# Patient Record
Sex: Female | Born: 1940 | Race: White | Hispanic: No | Smoking: Never smoker
Health system: Southern US, Community
[De-identification: ages and names within clinical notes are randomized; demographics above are authoritative.]

## PROBLEM LIST (undated history)

## (undated) DIAGNOSIS — I739 Peripheral vascular disease, unspecified: Secondary | ICD-10-CM

## (undated) DIAGNOSIS — I10 Essential (primary) hypertension: Secondary | ICD-10-CM

## (undated) DIAGNOSIS — I2510 Atherosclerotic heart disease of native coronary artery without angina pectoris: Secondary | ICD-10-CM

## (undated) DIAGNOSIS — I219 Acute myocardial infarction, unspecified: Secondary | ICD-10-CM

## (undated) DIAGNOSIS — I441 Atrioventricular block, second degree: Secondary | ICD-10-CM

## (undated) DIAGNOSIS — I4891 Unspecified atrial fibrillation: Secondary | ICD-10-CM

## (undated) DIAGNOSIS — M1990 Unspecified osteoarthritis, unspecified site: Secondary | ICD-10-CM

## (undated) DIAGNOSIS — I350 Nonrheumatic aortic (valve) stenosis: Secondary | ICD-10-CM

## (undated) DIAGNOSIS — I255 Ischemic cardiomyopathy: Secondary | ICD-10-CM

## (undated) DIAGNOSIS — I639 Cerebral infarction, unspecified: Secondary | ICD-10-CM

## (undated) DIAGNOSIS — R9430 Abnormal result of cardiovascular function study, unspecified: Secondary | ICD-10-CM

## (undated) DIAGNOSIS — E214 Other specified disorders of parathyroid gland: Secondary | ICD-10-CM

## (undated) DIAGNOSIS — IMO0002 Reserved for concepts with insufficient information to code with codable children: Secondary | ICD-10-CM

## (undated) DIAGNOSIS — D649 Anemia, unspecified: Secondary | ICD-10-CM

## (undated) DIAGNOSIS — I779 Disorder of arteries and arterioles, unspecified: Secondary | ICD-10-CM

## (undated) DIAGNOSIS — I509 Heart failure, unspecified: Secondary | ICD-10-CM

## (undated) DIAGNOSIS — E785 Hyperlipidemia, unspecified: Secondary | ICD-10-CM

## (undated) DIAGNOSIS — N186 End stage renal disease: Secondary | ICD-10-CM

## (undated) HISTORY — DX: Disorder of arteries and arterioles, unspecified: I77.9

## (undated) HISTORY — DX: Heart failure, unspecified: I50.9

## (undated) HISTORY — DX: Cerebral infarction, unspecified: I63.9

## (undated) HISTORY — PX: CHOLECYSTECTOMY: SHX55

## (undated) HISTORY — DX: Reserved for concepts with insufficient information to code with codable children: IMO0002

## (undated) HISTORY — PX: TOE AMPUTATION: SHX809

## (undated) HISTORY — DX: Unspecified atrial fibrillation: I48.91

## (undated) HISTORY — PX: CATARACT EXTRACTION: SUR2

## (undated) HISTORY — PX: RETINAL DETACHMENT SURGERY: SHX105

## (undated) HISTORY — DX: Anemia, unspecified: D64.9

## (undated) HISTORY — PX: OTHER SURGICAL HISTORY: SHX169

## (undated) HISTORY — DX: Atrioventricular block, second degree: I44.1

## (undated) HISTORY — PX: APPENDECTOMY: SHX54

## (undated) HISTORY — DX: Hyperlipidemia, unspecified: E78.5

## (undated) HISTORY — DX: Abnormal result of cardiovascular function study, unspecified: R94.30

## (undated) HISTORY — DX: Peripheral vascular disease, unspecified: I73.9

## (undated) HISTORY — PX: OOPHORECTOMY: SHX86

## (undated) HISTORY — PX: CORONARY ANGIOPLASTY: SHX604

## (undated) HISTORY — DX: Other specified disorders of parathyroid gland: E21.4

## (undated) HISTORY — DX: Essential (primary) hypertension: I10

## (undated) HISTORY — DX: Atherosclerotic heart disease of native coronary artery without angina pectoris: I25.10

---

## 1998-05-04 ENCOUNTER — Encounter: Payer: Self-pay | Admitting: Neurosurgery

## 1998-05-04 ENCOUNTER — Ambulatory Visit (HOSPITAL_COMMUNITY): Admission: RE | Admit: 1998-05-04 | Discharge: 1998-05-04 | Payer: Self-pay | Admitting: Neurosurgery

## 1998-05-18 ENCOUNTER — Encounter: Payer: Self-pay | Admitting: Neurosurgery

## 1998-05-18 ENCOUNTER — Ambulatory Visit (HOSPITAL_COMMUNITY): Admission: RE | Admit: 1998-05-18 | Discharge: 1998-05-18 | Payer: Self-pay | Admitting: Neurosurgery

## 1998-06-01 ENCOUNTER — Encounter: Payer: Self-pay | Admitting: Neurosurgery

## 1998-06-01 ENCOUNTER — Ambulatory Visit (HOSPITAL_COMMUNITY): Admission: RE | Admit: 1998-06-01 | Discharge: 1998-06-01 | Payer: Self-pay | Admitting: Neurosurgery

## 2004-10-04 ENCOUNTER — Ambulatory Visit: Payer: Self-pay | Admitting: Cardiology

## 2004-10-10 ENCOUNTER — Ambulatory Visit: Payer: Self-pay | Admitting: Cardiology

## 2004-11-20 ENCOUNTER — Encounter: Admission: RE | Admit: 2004-11-20 | Discharge: 2004-11-20 | Payer: Self-pay | Admitting: Neurosurgery

## 2004-12-06 ENCOUNTER — Encounter: Admission: RE | Admit: 2004-12-06 | Discharge: 2004-12-06 | Payer: Self-pay | Admitting: Neurosurgery

## 2005-01-29 DIAGNOSIS — I639 Cerebral infarction, unspecified: Secondary | ICD-10-CM

## 2005-01-29 HISTORY — DX: Cerebral infarction, unspecified: I63.9

## 2005-03-15 ENCOUNTER — Ambulatory Visit (HOSPITAL_COMMUNITY): Admission: RE | Admit: 2005-03-15 | Discharge: 2005-03-15 | Payer: Self-pay | Admitting: Ophthalmology

## 2005-11-09 ENCOUNTER — Ambulatory Visit: Payer: Self-pay | Admitting: Cardiology

## 2006-05-21 ENCOUNTER — Ambulatory Visit (HOSPITAL_COMMUNITY): Admission: RE | Admit: 2006-05-21 | Discharge: 2006-05-22 | Payer: Self-pay | Admitting: Ophthalmology

## 2006-06-25 ENCOUNTER — Ambulatory Visit: Payer: Self-pay | Admitting: Vascular Surgery

## 2006-06-25 ENCOUNTER — Ambulatory Visit (HOSPITAL_COMMUNITY): Admission: RE | Admit: 2006-06-25 | Discharge: 2006-06-25 | Payer: Self-pay | Admitting: *Deleted

## 2006-07-29 ENCOUNTER — Ambulatory Visit: Payer: Self-pay | Admitting: Vascular Surgery

## 2006-08-14 ENCOUNTER — Ambulatory Visit (HOSPITAL_COMMUNITY): Admission: RE | Admit: 2006-08-14 | Discharge: 2006-08-14 | Payer: Self-pay | Admitting: Vascular Surgery

## 2006-08-14 ENCOUNTER — Ambulatory Visit: Payer: Self-pay | Admitting: Vascular Surgery

## 2006-08-21 ENCOUNTER — Ambulatory Visit: Payer: Self-pay | Admitting: Vascular Surgery

## 2006-10-09 ENCOUNTER — Ambulatory Visit (HOSPITAL_COMMUNITY): Admission: RE | Admit: 2006-10-09 | Discharge: 2006-10-09 | Payer: Self-pay | Admitting: Nephrology

## 2006-11-27 ENCOUNTER — Ambulatory Visit: Payer: Self-pay | Admitting: Cardiology

## 2007-01-01 ENCOUNTER — Encounter: Payer: Self-pay | Admitting: Physician Assistant

## 2007-01-01 ENCOUNTER — Ambulatory Visit: Payer: Self-pay | Admitting: Cardiology

## 2007-01-09 ENCOUNTER — Ambulatory Visit: Payer: Self-pay | Admitting: Cardiology

## 2007-02-18 ENCOUNTER — Inpatient Hospital Stay (HOSPITAL_COMMUNITY): Admission: EM | Admit: 2007-02-18 | Discharge: 2007-02-24 | Payer: Self-pay | Admitting: Emergency Medicine

## 2007-02-18 ENCOUNTER — Ambulatory Visit: Payer: Self-pay | Admitting: Cardiovascular Disease

## 2008-01-14 ENCOUNTER — Ambulatory Visit (HOSPITAL_COMMUNITY): Admission: RE | Admit: 2008-01-14 | Discharge: 2008-01-14 | Payer: Self-pay | Admitting: Nephrology

## 2008-10-11 ENCOUNTER — Encounter: Payer: Self-pay | Admitting: Cardiology

## 2009-03-24 ENCOUNTER — Ambulatory Visit: Payer: Self-pay | Admitting: Cardiology

## 2009-03-31 ENCOUNTER — Inpatient Hospital Stay (HOSPITAL_COMMUNITY): Admission: AD | Admit: 2009-03-31 | Discharge: 2009-04-05 | Payer: Self-pay | Admitting: Cardiology

## 2009-03-31 ENCOUNTER — Ambulatory Visit: Payer: Self-pay | Admitting: Cardiology

## 2009-04-02 ENCOUNTER — Encounter: Payer: Self-pay | Admitting: Cardiology

## 2009-04-03 ENCOUNTER — Encounter: Payer: Self-pay | Admitting: Cardiology

## 2009-04-04 ENCOUNTER — Encounter: Payer: Self-pay | Admitting: Cardiology

## 2009-04-05 ENCOUNTER — Telehealth: Payer: Self-pay | Admitting: Cardiology

## 2009-04-20 ENCOUNTER — Encounter: Payer: Self-pay | Admitting: Cardiology

## 2009-04-22 DIAGNOSIS — E1149 Type 2 diabetes mellitus with other diabetic neurological complication: Secondary | ICD-10-CM | POA: Insufficient documentation

## 2009-04-22 DIAGNOSIS — Z8673 Personal history of transient ischemic attack (TIA), and cerebral infarction without residual deficits: Secondary | ICD-10-CM

## 2009-04-22 DIAGNOSIS — D631 Anemia in chronic kidney disease: Secondary | ICD-10-CM | POA: Insufficient documentation

## 2009-04-22 DIAGNOSIS — N039 Chronic nephritic syndrome with unspecified morphologic changes: Secondary | ICD-10-CM

## 2009-04-25 ENCOUNTER — Ambulatory Visit: Payer: Self-pay | Admitting: Cardiology

## 2009-04-26 ENCOUNTER — Encounter: Payer: Self-pay | Admitting: Cardiology

## 2009-05-06 ENCOUNTER — Encounter: Payer: Self-pay | Admitting: Cardiology

## 2009-06-13 ENCOUNTER — Encounter: Payer: Self-pay | Admitting: Cardiology

## 2009-07-01 ENCOUNTER — Encounter: Payer: Self-pay | Admitting: Cardiology

## 2009-07-04 ENCOUNTER — Ambulatory Visit: Payer: Self-pay | Admitting: Cardiology

## 2009-07-26 ENCOUNTER — Encounter: Payer: Self-pay | Admitting: Cardiology

## 2009-08-02 ENCOUNTER — Encounter: Payer: Self-pay | Admitting: Cardiology

## 2009-08-04 ENCOUNTER — Ambulatory Visit: Payer: Self-pay | Admitting: Cardiology

## 2009-08-11 ENCOUNTER — Encounter: Payer: Self-pay | Admitting: Cardiology

## 2009-08-18 ENCOUNTER — Encounter: Payer: Self-pay | Admitting: Cardiology

## 2009-09-07 ENCOUNTER — Ambulatory Visit: Payer: Self-pay | Admitting: Cardiology

## 2009-10-03 ENCOUNTER — Ambulatory Visit: Payer: Self-pay | Admitting: Cardiology

## 2009-10-10 ENCOUNTER — Encounter: Payer: Self-pay | Admitting: Cardiology

## 2009-11-07 ENCOUNTER — Ambulatory Visit: Payer: Self-pay | Admitting: Cardiology

## 2009-11-11 ENCOUNTER — Ambulatory Visit: Payer: Self-pay | Admitting: Cardiology

## 2009-11-11 ENCOUNTER — Encounter: Payer: Self-pay | Admitting: Cardiology

## 2009-11-18 ENCOUNTER — Encounter (INDEPENDENT_AMBULATORY_CARE_PROVIDER_SITE_OTHER): Payer: Self-pay | Admitting: *Deleted

## 2009-11-24 ENCOUNTER — Ambulatory Visit (HOSPITAL_COMMUNITY): Admission: RE | Admit: 2009-11-24 | Discharge: 2009-11-24 | Payer: Self-pay | Admitting: Ophthalmology

## 2009-12-02 ENCOUNTER — Ambulatory Visit: Payer: Self-pay | Admitting: Cardiology

## 2009-12-14 ENCOUNTER — Encounter: Payer: Self-pay | Admitting: Cardiology

## 2010-01-28 ENCOUNTER — Encounter: Payer: Self-pay | Admitting: Cardiology

## 2010-01-29 ENCOUNTER — Encounter: Payer: Self-pay | Admitting: Cardiology

## 2010-01-31 ENCOUNTER — Encounter: Payer: Self-pay | Admitting: Cardiology

## 2010-02-02 ENCOUNTER — Encounter: Payer: Self-pay | Admitting: Cardiology

## 2010-02-03 ENCOUNTER — Encounter: Payer: Self-pay | Admitting: Cardiology

## 2010-02-10 ENCOUNTER — Encounter: Payer: Self-pay | Admitting: Cardiology

## 2010-02-13 ENCOUNTER — Encounter: Payer: Self-pay | Admitting: Cardiology

## 2010-02-20 ENCOUNTER — Encounter: Payer: Self-pay | Admitting: Cardiology

## 2010-02-28 NOTE — Miscellaneous (Signed)
Summary: Home Care Report/ [facility]   Home Care Report/ [facility]   Imported By: Dorise Hiss 06/22/2009 16:46:22  _____________________________________________________________________  External Attachment:    Type:   Image     Comment:   External Document

## 2010-02-28 NOTE — Assessment & Plan Note (Signed)
Summary: **8:30** per Bjorn Loser B needs 3 month fu   Visit Type:  Follow-up Primary Provider:  Aurther Loft Daniel,MD  CC:  CAD.  History of Present Illness: Patient is seen for followup of coronary artery disease aortic stenosis, left ventricular dysfunction.  She had been hospitalized with some mild congestive heart failure and elevated troponins in March, 2011.  Cardiac catheterization was done at that time.  She does have significant disease that is followed medically.  Her LAD could be approached but it would be technically difficult.  She's not having significant signs of chest pain or heart failure.  At the time of her catheterization her ejection fraction appeared to be in the 35% range.  Continued adjustment of her meds will be done and then followup 2-D echo.  Preventive Screening-Counseling & Management  Alcohol-Tobacco     Smoking Status: never  Current Medications (verified): 1)  Amlodipine Besylate 5 Mg Tabs (Amlodipine Besylate) .... Take 1 Tablet By Mouth Once A Day 2)  Carvedilol 6.25 Mg Tabs (Carvedilol) .... Take 1 Tablet By Mouth Two Times A Day 3)  Apidra 100 Unit/ml Soln (Insulin Glulisine) .... As Directed Subcutaneously Three Times A Day 4)  Aspir-Low 81 Mg Tbec (Aspirin) .... Take 1 Tablet By Mouth Once A Day 5)  Benicar 40 Mg Tabs (Olmesartan Medoxomil) .... Take 1 Tablet By Mouth Once A Day 6)  Catapres-Tts-3 0.3 Mg/24hr Ptwk (Clonidine Hcl) .... Change Patch Weekly 7)  Zinc 50 Mg Tabs (Zinc) .... Take 1 Tablet By Mouth Once A Day 8)  Cardura Xl 8 Mg Xr24h-Tab (Doxazosin Mesylate) .... Take 1 Tablet By Mouth Once A Day 9)  Lantus 100 Unit/ml Soln (Insulin Glargine) .... Use As Directed Subcutaneously Daily 10)  Plavix 75 Mg Tabs (Clopidogrel Bisulfate) .... Take 1 Tablet By Mouth Once A Day 11)  Renagel 800 Mg Tabs (Sevelamer Hcl) .... Take 1-4 Tabs By Mouth Four Times A Day 12)  Sensipar 30 Mg Tabs (Cinacalcet Hcl) .... Take 1 Tablet By Mouth Once A Day 13)  Simvastatin  40 Mg Tabs (Simvastatin) .... Take 1 Tablet By Mouth Once A Day 14)  Omeprazole 20 Mg Cpdr (Omeprazole) .... Take 1 Tablet By Mouth Once A Day 15)  Dialyvite  Tabs (B Complex-C-Folic Acid) .... Take 1 Tablet By Mouth Once A Day 16)  Hydralazine Hcl 50 Mg Tabs (Hydralazine Hcl) .... Take 1 Tablet By Mouth Two Times A Day  Allergies (verified): 1)  ! * Narcotics Pills  Comments:  Nurse/Medical Assistant: The patient's medication list and allergies were reviewed with the patient and were updated in the Medication and Allergy Lists.   Past History:  Past Medical History: CAD  catheterization March, 2011. (presentation with mild CHF and mild increased troponin).. 70% proximal/80% mid LAD, 50% circumflex.Marland KitchenLAD could be approached but with difficulty....Marland Kitchen medical therapy recommended diabetes.  hyperlipidemia.  aortic stenosis  mild to moderate... echo.. September, 2006 / catheterization valve area 1.9 cm square EF  35%....catheterization.... 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  anemia.  CVA.  ESRD  .Marland Kitchen. dialysis Carotid artery disease.... Doppler... May 06, 2009 moderate plaque.... less than 50% stenosis bilateral     Review of Systems       Patient denies fever, chills, headache, sweats, rash, change in vision, change in hearing, chest pain, cough, shortness of breath, urinary symptoms.  She gets some nausea if she has not eaten.  She feels fatigued on her dialysis days.  Vital Signs:  Patient profile:  70 year old female Height:      64 inches Weight:      196 pounds Pulse rate:   71 / minute BP sitting:   178 / 67  (left arm) Cuff size:   large  Vitals Entered By: Carlye Grippe (July 04, 2009 8:21 AM)  Physical Exam  General:  patient is stable today but overweight. Head:  head is atraumatic. Eyes:  no xanthelasma. Neck:  no jugular venous distention.  Left carotid bruit present. Chest Wall:  no chest wall tenderness. Lungs:   lungs are clear.  Respiratory effort is nonlabored. Heart:  cardiac exam reveals an S1-S2.  There is a crescendo decrescendo murmur of aortic stenosis. Abdomen:  abdomen is soft. Msk:  no musculoskeletal deformities. Extremities:  trace peripheral edema. Skin:  no skin rashes. Psych:  patient is oriented to person time and place.  Affect is normal   Impression & Recommendations:  Problem # 1:  CAD (ICD-414.00)  Her updated medication list for this problem includes:    Amlodipine Besylate 5 Mg Tabs (Amlodipine besylate) .Marland Kitchen... Take 1 tablet by mouth once a day    Carvedilol 12.5 Mg Tabs (Carvedilol) .Marland Kitchen... Take one tablet by mouth twice a day    Aspir-low 81 Mg Tbec (Aspirin) .Marland Kitchen... Take 1 tablet by mouth once a day    Plavix 75 Mg Tabs (Clopidogrel bisulfate) .Marland Kitchen... Take 1 tablet by mouth once a day The patient's coronary disease is stable.  She's not having any chest pain.  Problem # 2:  RENAL INSUFFICIENCY (ICD-588.9) Patient has dialysis regularly.  She feels tired on her dialysis days.  Problem # 3:  ESSENTIAL HYPERTENSION, BENIGN (ICD-401.1)  Her updated medication list for this problem includes:    Amlodipine Besylate 5 Mg Tabs (Amlodipine besylate) .Marland Kitchen... Take 1 tablet by mouth once a day    Carvedilol 12.5 Mg Tabs (Carvedilol) .Marland Kitchen... Take one tablet by mouth twice a day    Aspir-low 81 Mg Tbec (Aspirin) .Marland Kitchen... Take 1 tablet by mouth once a day    Benicar 40 Mg Tabs (Olmesartan medoxomil) .Marland Kitchen... Take 1 tablet by mouth once a day    Catapres-tts-3 0.3 Mg/24hr Ptwk (Clonidine hcl) .Marland Kitchen... Change patch weekly    Hydralazine Hcl 50 Mg Tabs (Hydralazine hcl) .Marland Kitchen... Take 1 tablet by mouth two times a day The patient is on many medicines for blood pressure.  Today her systolic is still mildly elevated.  She needs further dose adjustment on her meds for her left ventricular dysfunction.  Carvedilol dose will be increased.  Problem # 4:  CAROTID BRUIT (ICD-785.9) The patient had followup  carotid Dopplers dated May 06, 2009.  She does have flat.  She has less then 50% bilateral disease.  She'll need followup in one year.  Problem # 5:  AORTIC VALVE DISORDERS (ICD-424.1)  Her updated medication list for this problem includes:    Carvedilol 12.5 Mg Tabs (Carvedilol) .Marland Kitchen... Take one tablet by mouth twice a day    Benicar 40 Mg Tabs (Olmesartan medoxomil) .Marland Kitchen... Take 1 tablet by mouth once a day The aortic stenosis is significant but not severe.  She will be followed.  Problem # 6:  * EF 35% The prior records suggest that her LV function was good.  Catheterization in March, 2011 revealed an ejection fraction of 35%.  Meds are being adjusted.  She is on ARB and carvedilol.  We are working hard to control her blood pressure area at some point we  will consider spironolactone.  Work today will increase her carvedilol from 6.25-12.5 b.i.d.  I will then see her back continue to titrate her meds.  Eventually we'll do a followup 2-D echo.  This  Patient Instructions: 1)  Increase Carvedilol (Coreg) to 12.5mg  by mouth two times a day. You may take 2 of your 6.25mg  tablets in the am and pm until gone. Then get new prescription filled for 12.5 mg tabets. 2)  Follow up appt on Tuesday, July 19th at 9:15am. Prescriptions: CARVEDILOL 12.5 MG TABS (CARVEDILOL) Take one tablet by mouth twice a day  #60 x 6   Entered by:   Cyril Loosen, RN, BSN   Authorized by:   Talitha Givens, MD, [facility]   Signed by:   Cyril Loosen, RN, BSN on 07/04/2009   Method used:   Electronically to        [facility]* (retail)       [address]       [place], [place]  [address]       Ph: [phone]       Fax: [phone]   RxID:   [id number]

## 2010-02-28 NOTE — Miscellaneous (Signed)
Summary: Home Health Certification/Care Plan   Home Health Certification/Care Plan   Imported By: Roderic Ovens 10/12/2009 11:37:27  _____________________________________________________________________  External Attachment:    Type:   Image     Comment:   External Document

## 2010-02-28 NOTE — Assessment & Plan Note (Signed)
Summary: eph post cath   Visit Type:  Follow-up Primary Provider:  Reuel Boom  CC:  follow-up visit.  History of Present Illness: 70 year old patient recently admitted to [facility] with congestive heart failure and mild increased troponin I. She underwent cardiac catheterization on March 31, 2009. Her ejection fraction was 35%. There was a 70% proximal and an 80% mid LAD. There was a 50% circumflex. There was moderately severe aortic stenosis although the aortic valve area was calculated at 1.9 cm. It was felt that the LAD would be difficult to approach although this could be entertained in the future if needed. Medical therapy was recommended. Note she also has end-stage renal disease. Since discharge she denies any dyspnea on exertion, orthopnea, PND, pedal edema, palpitations, syncope or chest pain.  Preventive Screening-Counseling & Management  Alcohol-Tobacco     Smoking Status: never  Current Medications (verified): 1)  Amlodipine Besylate 5 Mg Tabs (Amlodipine Besylate) .... Take 1 Tablet By Mouth Once A Day 2)  Carvedilol 3.125 Mg Tabs (Carvedilol) .... Take 1 Tablet By Mouth Two Times A Day 3)  Xopenex Hfa 45 Mcg/act Aero (Levalbuterol Tartrate) .... 2 Puffs Every 4 Hours As Needed 4)  Nystatin 100000 Unit/gm Oint (Nystatin) .... As Needed 5)  Apidra 100 Unit/ml Soln (Insulin Glulisine) .... 6u Subcutaneously Three Times A Day 6)  Aspir-Low 81 Mg Tbec (Aspirin) .... Take 1 Tablet By Mouth Once A Day 7)  Benicar 40 Mg Tabs (Olmesartan Medoxomil) .... Take 1 Tablet By Mouth Once A Day 8)  Catapres-Tts-3 0.3 Mg/24hr Ptwk (Clonidine Hcl) .... Change Patch Weekly 9)  Zinc 50 Mg Tabs (Zinc) .... Take 1 Tablet By Mouth Once A Day 10)  Cardura Xl 8 Mg Xr24h-Tab (Doxazosin Mesylate) .... Take 1 Tablet By Mouth Once A Day 11)  Epogen 10000 Unit/ml Soln (Epoetin Alfa) .... Three Times Weekly 12)  Hectorol 1 Mcg Caps (Doxercalciferol) .... Three Times Weekly 13)  Lantus 100 Unit/ml  Soln (Insulin Glargine) .... 50u Subcutaneously Daily 14)  Pantoprazole Sodium 40 Mg Tbec (Pantoprazole Sodium) .... Take 1 Tablet By Mouth Once A Day 15)  Plavix 75 Mg Tabs (Clopidogrel Bisulfate) .... Take 1 Tablet By Mouth Once A Day 16)  Renagel 800 Mg Tabs (Sevelamer Hcl) .... Take 1-4 Tabs By Mouth Four Times A Day 17)  Sensipar 30 Mg Tabs (Cinacalcet Hcl) .... Take 1 Tablet By Mouth Once A Day 18)  Venofer 20 Mg/ml Soln (Iron Sucrose) .... 50mg  Iv Weekly 19)  Simvastatin 40 Mg Tabs (Simvastatin) .... Take 1 Tablet By Mouth Once A Day  Allergies (verified): 1)  ! * Narcotics Pills  Comments:  Nurse/Medical Assistant: The patient's medications and allergies were reviewed with the patient and were updated in the Medication and Allergy Lists. List reviewed.  Past History:  Past Medical History: History of coronary artery disease.   History of diabetes.   History of hyperlipidemia.   History of mild to moderate aortic stenosis.   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   History of hypertension.   History of anemia.  History of CVA.  End-stage renal disease on maintenance hemodialysis Tuesday,       Thursday and Saturday.      Past Surgical History: Reviewed history from 04/22/2009 and no changes required.  PAST SURGICAL HISTORY:  Appendectomy, oophorectomy, left great toe   amputation, retinal surgery, and cataract surgery.   Social History: Reviewed history from 04/22/2009 and no  changes required.  The patient lives in [place].  She lives alone.  She is   retired.  She is divorced and has four children.  She has a daughter   with diabetes.  She has a son with hiatal hernia.  She does have a   nurse's aide that is at her house 5 days a week.  She has no history of   smoking.  She denies alcohol.  Denies drug abuse.     Smoking Status:  never  Review of Systems       no fevers or chills, productive cough, hemoptysis, dysphasia,  odynophagia, melena, hematochezia, dysuria, hematuria, rash, seizure activity, orthopnea, PND, pedal edema, claudication. Remaining systems are negative.   Vital Signs:  Patient profile:   70 year old female Height:      64 inches Weight:      197 pounds BMI:     33.94 Pulse rate:   87 / minute BP sitting:   177 / 71  (right arm) Cuff size:   large  Vitals Entered By: Carlye Grippe (April 25, 2009 10:08 AM)  Nutrition Counseling: Patient's BMI is greater than 25 and therefore counseled on weight management options. CC: follow-up visit   Physical Exam  General:  Well-developed well-nourished in no acute distress.  Skin is warm and dry.  HEENT is normal.  Neck is supple. No thyromegaly. Bilateral bruits. Chest is clear to auscultation with normal expansion.  Cardiovascular exam is regular rate and rhythm. 3/6 systolic murmur left sternal border Abdominal exam nontender or distended. No masses palpated. Right groin with no hematoma and no bruit. Extremities show trace edema. neuro grossly intact    Impression & Recommendations:  Problem # 1:  CAD (ICD-414.00)  Continue aspirin, Plavix, beta blocker and statin. Consider PCI of LAD if she develops chest pain. Her updated medication list for this problem includes:    Amlodipine Besylate 5 Mg Tabs (Amlodipine besylate) .Marland Kitchen... Take 1 tablet by mouth once a day    Carvedilol 6.25 Mg Tabs (Carvedilol) .Marland Kitchen... Take 1 tablet by mouth two times a day    Aspir-low 81 Mg Tbec (Aspirin) .Marland Kitchen... Take 1 tablet by mouth once a day    Plavix 75 Mg Tabs (Clopidogrel bisulfate) .Marland Kitchen... Take 1 tablet by mouth once a day  Her updated medication list for this problem includes:    Amlodipine Besylate 5 Mg Tabs (Amlodipine besylate) .Marland Kitchen... Take 1 tablet by mouth once a day    Carvedilol 6.25 Mg Tabs (Carvedilol) .Marland Kitchen... Take 1 tablet by mouth two times a day    Aspir-low 81 Mg Tbec (Aspirin) .Marland Kitchen... Take 1 tablet by mouth once a day    Plavix 75 Mg Tabs  (Clopidogrel bisulfate) .Marland Kitchen... Take 1 tablet by mouth once a day  Problem # 2:  AORTIC VALVE DISORDERS (ICD-424.1) History of mild to moderate aortic stenosis. Plan repeat echocardiogram in 6 months when she returns. Her updated medication list for this problem includes:    Carvedilol 3.125 Mg Tabs (Carvedilol) .Marland Kitchen... Take 1 tablet by mouth two times a day    Benicar 40 Mg Tabs (Olmesartan medoxomil) .Marland Kitchen... Take 1 tablet by mouth once a day  Problem # 3:  RENAL INSUFFICIENCY (ICD-588.9) Dialysis dependent. Management per nephrology.  Problem # 4:  ESSENTIAL HYPERTENSION, BENIGN (ICD-401.1) Blood pressure elevated. Increase carvedilol to 6.25 mg p.o. b.i.d. both for blood pressure and cardiomyopathy. The following medications were removed from the medication list:    Hydralazine Hcl 25 Mg  Tabs (Hydralazine hcl) .Marland Kitchen... Take 1 tablet by mouth two times a day Her updated medication list for this problem includes:    Amlodipine Besylate 5 Mg Tabs (Amlodipine besylate) .Marland Kitchen... Take 1 tablet by mouth once a day    Carvedilol 6.25 Mg Tabs (Carvedilol) .Marland Kitchen... Take 1 tablet by mouth two times a day    Aspir-low 81 Mg Tbec (Aspirin) .Marland Kitchen... Take 1 tablet by mouth once a day    Benicar 40 Mg Tabs (Olmesartan medoxomil) .Marland Kitchen... Take 1 tablet by mouth once a day    Catapres-tts-3 0.3 Mg/24hr Ptwk (Clonidine hcl) .Marland Kitchen... Change patch weekly  Problem # 5:  CAROTID BRUIT (ICD-785.9) Check carotid Dopplers. Orders: Carotid Duplex (Carotid Duplex)  Problem # 6:  HYPERLIPIDEMIA (ICD-272.4) Continue statin. Lipids and liver monitored by her primary care. Her updated medication list for this problem includes:    Simvastatin 40 Mg Tabs (Simvastatin) .Marland Kitchen... Take 1 tablet by mouth once a day  Her updated medication list for this problem includes:    Simvastatin 40 Mg Tabs (Simvastatin) .Marland Kitchen... 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 Take 1 tablet by mouth once a day    Benicar 40 Mg Tabs (Olmesartan medoxomil) .Marland Kitchen... Take 1 tablet by mouth once a day    Lantus 100 Unit/ml Soln (Insulin glargine) .Marland KitchenMarland KitchenMarland KitchenMarland Kitchen 50u subcutaneously daily  Her updated medication list for this problem includes:    Apidra 100 Unit/ml Soln (Insulin glulisine) .Marland KitchenMarland KitchenMarland KitchenMarland Kitchen 6u subcutaneously three times a day    Aspir-low 81 Mg Tbec (Aspirin) .Marland Kitchen... Take 1 tablet by mouth once a day    Benicar 40 Mg Tabs (Olmesartan medoxomil) .Marland Kitchen... Take 1 tablet by mouth once a day    Lantus 100 Unit/ml Soln (Insulin glargine) .Marland KitchenMarland KitchenMarland KitchenMarland Kitchen 50u subcutaneously daily  Problem # 8:  OTHER SPEC FORMS CHRONIC ISCHEMIC HEART DISEASE (ICD-414.8)  Continue present medications. Increase Coreg as outlined above. Her updated medication list for this problem includes:    Amlodipine Besylate 5 Mg Tabs (Amlodipine besylate) .Marland Kitchen... Take 1 tablet by mouth once a day    Carvedilol 6.25 Mg Tabs (Carvedilol) .Marland Kitchen... Take 1 tablet by mouth two times a day    Aspir-low 81 Mg Tbec (Aspirin) .Marland Kitchen... Take 1 tablet by mouth once a day    Benicar 40 Mg Tabs (Olmesartan medoxomil) .Marland Kitchen... Take 1 tablet by mouth once a day    Plavix 75 Mg Tabs (Clopidogrel bisulfate) .Marland Kitchen... Take 1 tablet by mouth once a day  Her updated medication list for this problem includes:    Amlodipine Besylate 5 Mg Tabs (Amlodipine besylate) .Marland Kitchen... Take 1 tablet by mouth once a day    Carvedilol 6.25 Mg Tabs (Carvedilol) .Marland Kitchen... Take 1 tablet by mouth two times a day    Aspir-low 81 Mg Tbec (Aspirin) .Marland Kitchen... Take 1 tablet by mouth once a day    Benicar 40 Mg Tabs (Olmesartan medoxomil) .Marland Kitchen... Take 1 tablet by mouth once a day    Plavix 75 Mg Tabs (Clopidogrel bisulfate) .Marland Kitchen... Take 1 tablet by mouth once a day  Patient Instructions: 1)  Your physician recommends that you schedule  a follow-up appointment in: KEEP SCHEDULED APPOINTMENT WITH DR. KATZ ON JUNE 6TH 2011 @9 :00AM. 2)  Your physician  has recommended you make the following change in your medication: INCREASE CARVEDILOL TO 6.25MG  TWICE DAILY. You may take two of your 3.125mg  twice daily  until finished. 3)  Your physician has requested that you have a carotid duplex. This test is an ultrasound of the carotid arteries in your neck. It looks at blood flow through these arteries that supply the brain with blood. Allow one hour for this exam. There are no restrictions or special instructions. Prescriptions: CARVEDILOL 6.25 MG TABS (CARVEDILOL) Take 1 tablet by mouth two times a day  #60 x 6   Entered by:   Carlye Grippe   Authorized by:   Ferman Hamming, MD, [facility]   Signed by:   Carlye Grippe on 04/25/2009   Method used:   Electronically to        [facility]* (retail)       [address]       [place], [place]  [address]       Ph: [phone]       Fax: [phone]   RxID:   [id number]

## 2010-02-28 NOTE — Miscellaneous (Signed)
Summary: Home Care Report/ [facility]  Home Care Report/ [facility]   Imported By: Dorise Hiss 05/12/2009 16:25:11  _____________________________________________________________________  External Attachment:    Type:   Image     Comment:   External Document

## 2010-02-28 NOTE — Assessment & Plan Note (Signed)
Summary: 6 WK F/U-JM   Visit Type:  Follow-up Primary Provider:  Guido Sander  CC:  cardiomyopathy.  History of Present Illness: Melissa [name] is seen in followup cardiomyopathy.  I saw her last July 04, 2009.  At that time her carvedilol dose was increased.  The Melissa [name] now has edema.  This is despite her dialysis.  She notes that she actually has responded to furosemide in the past and that she does currently make urine.  Preventive Screening-Counseling & Management  Alcohol-Tobacco     Smoking Status: never  Current Medications (verified): 1)  Amlodipine Besylate 5 Mg Tabs (Amlodipine Besylate) .... Take 1 Tablet By Mouth Once A Day 2)  Carvedilol 12.5 Mg Tabs (Carvedilol) .... Take One Tablet By Mouth Twice A Day 3)  Apidra 100 Unit/ml Soln (Insulin Glulisine) .... As Directed Subcutaneously Three Times A Day 4)  Aspir-Low 81 Mg Tbec (Aspirin) .... Take 1 Tablet By Mouth Once A Day 5)  Benicar 40 Mg Tabs (Olmesartan Medoxomil) .... Take 1 Tablet By Mouth Once A Day 6)  Catapres-Tts-3 0.3 Mg/24hr Ptwk (Clonidine Hcl) .... Change Patch Weekly 7)  Zinc 50 Mg Tabs (Zinc) .... Take 1 Tablet By Mouth Once A Day 8)  Cardura Xl 8 Mg Xr24h-Tab (Doxazosin Mesylate) .... Take 1 Tablet By Mouth Once A Day 9)  Lantus 100 Unit/ml Soln (Insulin Glargine) .... Use As Directed Subcutaneously Daily 10)  Plavix 75 Mg Tabs (Clopidogrel Bisulfate) .... Take 1 Tablet By Mouth Once A Day 11)  Renagel 800 Mg Tabs (Sevelamer Hcl) .... Take 1-4 Tabs By Mouth Four Times A Day 12)  Sensipar 30 Mg Tabs (Cinacalcet Hcl) .... Take 1 Tablet By Mouth Once A Day 13)  Simvastatin 40 Mg Tabs (Simvastatin) .... Take 1 Tablet By Mouth Once A Day 14)  Omeprazole 20 Mg Cpdr (Omeprazole) .... Take 1 Tablet By Mouth Once A Day 15)  Dialyvite  Tabs (B Complex-C-Folic Acid) .... Take 1 Tablet By Mouth Once A Day 16)  Hydralazine Hcl 50 Mg Tabs (Hydralazine Hcl) .... Take 1 Tablet By Mouth Two Times A Day  Allergies  (verified): 1)  ! * Narcotics Pills  Comments:  Nurse/Medical Assistant: The Melissa [name]'s medication list and allergies were reviewed with the Melissa [name] and were updated in the Medication and Allergy Lists.  Past History:  Past Medical History: CAD  catheterization March, 2011. (presentation with mild CHF and mild increased troponin).. 70% proximal/80% mid LAD, 50% circumflex.Marland KitchenLAD could be approached but with difficulty....Marland Kitchen medical therapy recommended diabetes.  hyperlipidemia.  aortic stenosis  mild to moderate... echo.. September, 2006 / catheterization valve area 1.9 cm square EF  35%....catheterization.... 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  anemia.  CVA.  ESRD  .Marland Kitchen. dialysis Carotid artery disease.... Doppler... May 06, 2009 moderate plaque.... less than 50% stenosis bilateral Edema     Review of Systems       Melissa [name] denies fever, chills, headache, sweats, rash, change in vision, change in hearing, chest pain, cough, nausea vomiting, urinary symptoms.  All other systems are reviewed and are negative.  Vital Signs:  Melissa [name] profile:   70 year old female Height:      64 inches Weight:      200 pounds Pulse rate:   67 / minute BP sitting:   170 / 76  (right arm) Cuff size:   large  Vitals Entered By: Carlye Grippe (September 07, 2009 10:01 AM)  Physical Exam  General:  Melissa [name] is stable. Eyes:  no xanthelasma. Neck:  no jugular venous distention. Lungs:  lungs reveal a few scattered rhonchi.  There is no respiratory distress. Heart:  cardiac exam reveals S1 and S2 and no murmur of aortic stenosis. Abdomen:  abdomen is soft. Extremities:  there is 1+ peripheral edema. Psych:  Melissa [name] is oriented to person time and place.  Affect is normal.   Impression & Recommendations:  Problem # 1:  * EF 35% I will not adjust her carvedilol further today as I will be changing other medicines.  Problem # 2:  EDEMA (ICD-782.3) The  Melissa [name] is bothered by her edema.  She feels that she will do better responding to the re\re addition of her diuretic has a posterior pushing harder at dialysis.  In this case this may work as she does make urine.  Furosemide 40 mg daily will be added.  All and see her for followup.  Melissa [name] Instructions: 1)  Start Lasix (furosemide) 40mg  by mouth once daily. 2)  Return office visit with Dr. Myrtis Ser on Monday, November 07, 2009 at 9am. Prescriptions: FUROSEMIDE 40 MG TABS (FUROSEMIDE) Take one tablet by mouth daily.  #30 x 6   Entered by:   Cyril Loosen, RN, BSN   Authorized by:   Talitha Givens, MD, [facility]   Signed by:   Cyril Loosen, RN, BSN on 09/07/2009   Method used:   Electronically to        [facility]* (retail)       [address]       [place], [place]  [address]       Ph: [phone]       Fax: [phone]   RxID:   [id number]

## 2010-02-28 NOTE — Assessment & Plan Note (Signed)
Summary: 2 MONTH F/U PER 8/10 OV-JM   Visit Type:  Follow-up Primary Melissa [name]:  Guido Sander  CC:  cardiomyopathy.  History of Present Illness: The patient is seen for followup of cardiomyopathy.  I saw her last August, 2011.  She does have coronary disease.  Medical therapy is recommended.  She also has aortic stenosis.  Left ventricular dysfunction is out of proportion to her coronary disease and aortic disease.  We have adjusted her medications.  When I saw her last we added a diuretic.  Even though she has dialysis she makes urine at home and the diuretics help with her overall volume status.  She feels good in general.  Preventive Screening-Counseling & Management  Alcohol-Tobacco     Smoking Status: never  Current Medications (verified): 1)  Amlodipine Besylate 5 Mg Tabs (Amlodipine Besylate) .... Take 1 Tablet By Mouth Once A Day 2)  Carvedilol 12.5 Mg Tabs (Carvedilol) .... Take One Tablet By Mouth Twice A Day 3)  Apidra 100 Unit/ml Soln (Insulin Glulisine) .... As Directed Subcutaneously Three Times A Day 4)  Aspir-Low 81 Mg Tbec (Aspirin) .... Take 1 Tablet By Mouth Once A Day 5)  Benicar 40 Mg Tabs (Olmesartan Medoxomil) .... Take 1 Tablet By Mouth Once A Day 6)  Catapres-Tts-3 0.3 Mg/24hr Ptwk (Clonidine Hcl) .... Change Patch Weekly 7)  Zinc 50 Mg Tabs (Zinc) .... Take 1 Tablet By Mouth Once A Day 8)  Cardura Xl 8 Mg Xr24h-Tab (Doxazosin Mesylate) .... Take 1 Tablet By Mouth Once A Day 9)  Lantus 100 Unit/ml Soln (Insulin Glargine) .... Use As Directed Subcutaneously Daily 10)  Plavix 75 Mg Tabs (Clopidogrel Bisulfate) .... Take 1 Tablet By Mouth Once A Day 11)  Renagel 800 Mg Tabs (Sevelamer Hcl) .... Take 1-4 Tabs By Mouth Four Times A Day 12)  Sensipar 30 Mg Tabs (Cinacalcet Hcl) .... Take 1 Tablet By Mouth Once A Day 13)  Simvastatin 40 Mg Tabs (Simvastatin) .... Take 1 Tablet By Mouth Once A Day 14)  Omeprazole 20 Mg Cpdr (Omeprazole) .... Take 1 Tablet By Mouth  Once A Day 15)  Dialyvite  Tabs (B Complex-C-Folic Acid) .... Take 1 Tablet By Mouth Once A Day 16)  Hydralazine Hcl 50 Mg Tabs (Hydralazine Hcl) .... Take 1 Tablet By Mouth Two Times A Day 17)  Furosemide 40 Mg Tabs (Furosemide) .... Take One Tablet By Mouth Daily.  Allergies (verified): 1)  ! * Narcotics Pills  Comments:  Nurse/Medical Assistant: The patient's medication list and allergies were reviewed with the patient and were updated in the Medication and Allergy Lists.  Past History:  Past Medical History: CAD  catheterization March, 2011. (presentation with mild CHF and mild increased troponin).. 70% proximal/80% mid LAD, 50% circumflex.Marland KitchenLAD could be approached but with difficulty....Marland Kitchen medical therapy recommended diabetes.  hyperlipidemia.  aortic stenosis  mild to moderate... echo.. September, 2006 / catheterization valve area 1.9 cm square EF  35%....catheterization.... 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  anemia.  CVA.  ESRD  .Marland Kitchen. dialysis Carotid artery disease.... Doppler... May 06, 2009 moderate plaque.... less than 50% stenosis bilateral Edema     Review of Systems       Patient denies fever, chills, headache, sweats, rash, change in vision, change in hearing, chest pain, cough, nausea vomiting, urinary symptoms.  All other systems are reviewed and are negative.  Vital Signs:  Patient profile:   70 year old female Height:      64 inches  Weight:      198 pounds Pulse rate:   67 / minute BP sitting:   174 / 75  (right arm) Cuff size:   large  Vitals Entered By: Carlye Grippe (November 07, 2009 8:47 AM)  Physical Exam  General:  patient is stable today. Head:  head is atraumatic. Eyes:  no xanthelasma. Neck:  no jugular venous distention. Chest Wall:  no chest wall tenderness. Lungs:  lungs are clear.  Respiratory effort is nonlabored. Heart:  cardiac exam reveals an S1 and S2.  There is a murmur of aortic  stenosis. Abdomen:  abdomen is soft. Msk:  no musculoskeletal deformities. Extremities:  no peripheral edema. Skin:  no skin rashes. Psych:  patient is oriented to person time and place.  Affect is normal.   Impression & Recommendations:  Problem # 1:  EDEMA (ICD-782.3) The patient does not have any significant edema today.  Her furosemide will be continued.  This does not appear to be causing difficulties with her dialysis.  Problem # 2:  * EF 35% It is now time for a followup two-dimensional echo to reassess her LV function.  This will be scheduled.  I will be in touch with her with the information.  I will plan to see her back in 3 months for followup.  Problem # 3:  CAD (ICD-414.00)  Her updated medication list for this problem includes:    Amlodipine Besylate 5 Mg Tabs (Amlodipine besylate) .Marland Kitchen... Take 1 tablet by mouth once a day    Carvedilol 12.5 Mg Tabs (Carvedilol) .Marland Kitchen... Take one tablet by mouth twice a day    Aspir-low 81 Mg Tbec (Aspirin) .Marland Kitchen... Take 1 tablet by mouth once a day    Plavix 75 Mg Tabs (Clopidogrel bisulfate) .Marland Kitchen... Take 1 tablet by mouth once a day Coronary disease is stable.  No change in therapy.  Problem # 4:  RENAL INSUFFICIENCY (ICD-588.9) The patient continues on dialysis.  She is stable with this.  Problem # 5:  ESSENTIAL HYPERTENSION, BENIGN (ICD-401.1)  Her updated medication list for this problem includes:    Amlodipine Besylate 5 Mg Tabs (Amlodipine besylate) .Marland Kitchen... Take 1 tablet by mouth once a day    Carvedilol 12.5 Mg Tabs (Carvedilol) .Marland Kitchen... Take one tablet by mouth twice a day    Aspir-low 81 Mg Tbec (Aspirin) .Marland Kitchen... Take 1 tablet by mouth once a day    Benicar 40 Mg Tabs (Olmesartan medoxomil) .Marland Kitchen... Take 1 tablet by mouth once a day    Catapres-tts-3 0.3 Mg/24hr Ptwk (Clonidine hcl) .Marland Kitchen... Change patch weekly    Hydralazine Hcl 50 Mg Tabs (Hydralazine hcl) .Marland Kitchen... Take 1 tablet by mouth two times a day    Furosemide 40 Mg Tabs (Furosemide) .Marland Kitchen...  Take one tablet by mouth daily. The patient is on many medications.  Her blood pressure can vary significantly.  I will not be changing her medicine today.  Problem # 6:  AORTIC VALVE DISORDERS (ICD-424.1)  Her updated medication list for this problem includes:    Carvedilol 12.5 Mg Tabs (Carvedilol) .Marland Kitchen... Take one tablet by mouth twice a day    Benicar 40 Mg Tabs (Olmesartan medoxomil) .Marland Kitchen... Take 1 tablet by mouth once a day    Furosemide 40 Mg Tabs (Furosemide) .Marland Kitchen... Take one tablet by mouth daily. For aortic valve stenosis is followed at this time.  It is mild-to-moderate.  Orders: 2-D Echocardiogram (2D Echo)  Patient Instructions: 1)  Your physician wants you to follow-up in: 3  months. You will receive a reminder letter in the mail one-two months in advance. If you don't receive a letter, please call our office to schedule the follow-up appointment. 2)  Your physician recommends that you continue on your current medications as directed. Please refer to the Current Medication list given to you today. 3)  Your physician has requested that you have an echocardiogram.  Echocardiography is a painless test that uses sound waves to create images of your heart. It provides your doctor with information about the size and shape of your heart and how well your heart's chambers and valves are working.  This procedure takes approximately one hour. There are no restrictions for this procedure. If the results of your test are normal or stable, you will receive a letter. If they are abnormal, the nurse will contact you by phone.

## 2010-02-28 NOTE — Progress Notes (Signed)
Summary: discharge meds not covered by insurance  Phone Note From Pharmacy Call back at [phone]   Caller: [facility] Summary of Call: Insurance will not cover Xopenex prescribed at discharge today by [place] PA.  Is there an alternative.  Patient is to be followed at [facility] office by Dr. Jens Som. Initial call taken by: Burnard Leigh,  April 05, 2009 4:14 PM  Follow-up for Phone Call        spoke with pharm, script will be changed to albuterol Deliah Goody, RN  April 05, 2009 4:28 PM

## 2010-02-28 NOTE — Miscellaneous (Signed)
Summary: Home Care Report/ [facility]  Home Care Report/ [facility]   Imported By: Dorise Hiss 05/05/2009 14:48:50  _____________________________________________________________________  External Attachment:    Type:   Image     Comment:   External Document

## 2010-02-28 NOTE — Letter (Signed)
Summary: [facility]  [address]   [place], [place] [address]   Phone: [phone]  Fax: [phone]        November 18, 2009 MRN: [id number]    JILLIENNE EGNER [address] [place], [place]  [address]    Dear Ms. Yetta Barre,  Your test ordered by Selena Batten has been reviewed by your physician (or physician assistant) and was found to be normal or stable. Your physician (or physician assistant) felt no changes were needed at this time.  __X__ Echocardiogram  ____ Cardiac Stress Test  ____ Lab Work  ____ Peripheral vascular study of arms, legs or neck  ____ CT scan or X-ray  ____ Lung or Breathing test  ____ Other:   Thank you.   Cyril Loosen, RN, BSN    Duane Boston, M.D., F.A.C.C. Thressa Sheller, M.D., F.A.C.C. Oneal Grout, M.D., F.A.C.C. Cheree Ditto, M.D., F.A.C.C. Daiva Nakayama, M.D., F.A.C.C. Kenney Houseman, M.D., F.A.C.C. Jeanne Ivan, PA-C

## 2010-02-28 NOTE — Letter (Signed)
Summary: Appointment- Rescheduled  [facility] [address]   [place], [place] [address]   Phone: [phone]  Fax: [phone]     August 18, 2009 MRN: [id number]     CHARNEL GILES [address] [place], [place]  [address]     Dear Ms. KOSINSKI,   Due to a change in our office schedule, your appointment time on   September 07, 2009 at 2:30 must be changed.    Your new appointment time for September 07, 2009 will be 10:45.  We look forward to participating in your health care needs.     Sincerely,  Glass blower/designer

## 2010-02-28 NOTE — Miscellaneous (Signed)
Summary: Home Health Certification/Care Plan   Home Health Certification/Care Plan   Imported By: Roderic Ovens 01/05/2010 08:50:04  _____________________________________________________________________  External Attachment:    Type:   Image     Comment:   External Document

## 2010-02-28 NOTE — Miscellaneous (Signed)
Summary: Medical Update/Patient Information   Medical Update/Patient Information   Imported By: Roderic Ovens 08/17/2009 14:31:10  _____________________________________________________________________  External Attachment:    Type:   Image     Comment:   External Document

## 2010-02-28 NOTE — Miscellaneous (Signed)
  Clinical Lists Changes  Problems: Added new problem of CAD (ICD-414.00) Observations: Added new observation of PAST MED HX: CAD  catheterization March, 2011... 70% proximal/80% mid LAD, 50% circumflex.Marland KitchenLAD could be approached but with difficulty medical therapy recommend is diabetes.  hyperlipidemia.  aortic stenosis  mild to moderate... echo.. 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  anemia.  CVA.  ESRD  .Marland Kitchen. dialysis Carotid artery disease.... Doppler... May 06, 2009 moderate plaque.... less than 50% stenosis bilateral     (07/01/2009 17:28) Added new observation of PRIMARY MD: Reuel Boom (07/01/2009 17:28)       Past History:  Past Medical History: CAD  catheterization March, 2011... 70% proximal/80% mid LAD, 50% circumflex.Marland KitchenLAD could be approached but with difficulty medical therapy recommend is diabetes.  hyperlipidemia.  aortic stenosis  mild to moderate... echo.. 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  anemia.  CVA.  ESRD  .Marland Kitchen. dialysis Carotid artery disease.... Doppler... May 06, 2009 moderate plaque.... less than 50% stenosis bilateral

## 2010-03-02 NOTE — Miscellaneous (Signed)
Summary: Home Care Report/ DISCHARGE  [facility]  Home Care Report/ DISCHARGE  [facility]   Imported By: Dorise Hiss 02/20/2010 08:38:51  _____________________________________________________________________  External Attachment:    Type:   Image     Comment:   External Document

## 2010-04-02 ENCOUNTER — Encounter: Payer: Self-pay | Admitting: Cardiology

## 2010-04-03 ENCOUNTER — Encounter: Payer: Self-pay | Admitting: Cardiology

## 2010-04-03 ENCOUNTER — Ambulatory Visit (INDEPENDENT_AMBULATORY_CARE_PROVIDER_SITE_OTHER): Payer: Medicare Other | Admitting: Cardiology

## 2010-04-03 DIAGNOSIS — I059 Rheumatic mitral valve disease, unspecified: Secondary | ICD-10-CM

## 2010-04-03 DIAGNOSIS — I2510 Atherosclerotic heart disease of native coronary artery without angina pectoris: Secondary | ICD-10-CM

## 2010-04-03 DIAGNOSIS — I6529 Occlusion and stenosis of unspecified carotid artery: Secondary | ICD-10-CM

## 2010-04-06 NOTE — Medication Information (Signed)
Summary: [facility] D/C MEDICATION SHEET  [facility] D/C MEDICATION SHEET   Imported By: Zachary George 03/31/2010 17:18:35  _____________________________________________________________________  External Attachment:    Type:   Image     Comment:   External Document

## 2010-04-06 NOTE — Consult Note (Signed)
Summary: CARDIOLOGY CONSULT/ [facility]  CARDIOLOGY CONSULT/ [facility]   Imported By: Zachary George 03/31/2010 17:13:14  _____________________________________________________________________  External Attachment:    Type:   Image     Comment:   External Document

## 2010-04-06 NOTE — Consult Note (Signed)
Summary: [facility] DR. Wolfgang Phoenix  [facility] DR[facility] Wolfgang Phoenix   Imported By: Zachary George 03/31/2010 17:11:51  _____________________________________________________________________  External Attachment:    Type:   Image     Comment:   External Document

## 2010-04-06 NOTE — Letter (Signed)
Summary: [facility] D/C DR. DANIEL  [facility] D/C DR. DANIEL   Imported By: Zachary George 03/31/2010 17:12:28  _____________________________________________________________________  External Attachment:    Type:   Image     Comment:   External Document

## 2010-04-11 NOTE — Assessment & Plan Note (Signed)
Summary: 3 MO F/U REV. REMINDER-VS, R/S DUE TO EPIC GO LIVE LETTER MAI...   Visit Type:  post hospital visit Primary Provider:  Aurther Loft Daniel,MD  CC:  CAD.  History of Present Illness: The patient is seen for a post hospital visit.  I had seen her last in the office in October, 2011.  In January, 2012 the patient was admitted with anemia and sepsis.  Her troponin did rise to 6.  Her cardiac status was stable with this.  When I had seen her in October, plans were made for a followup echo.  Her EF was 40-45%.  Her aortic stenosis was moderate to severe.  During her hospitalization in January, 2012 her echo showed the same aortic stenosis.  Ejection fraction was in the range of 50%.  Therefore, despite the troponin elevation with her acute illness, she did not suffer any significant muscle injury.  Since the hospitalization she's felt well and she has not had chest pain.  She does not have any signs of heart failure.  As part of the evaluation today I have carefully reviewed her hospital records including the echo data and her H&P and her discharge summary.  I have updated the current record.  Preventive Screening-Counseling & Management  Alcohol-Tobacco     Smoking Status: never  Current Medications (verified): 1)  Amlodipine Besylate 5 Mg Tabs (Amlodipine Besylate) .... Take 1 Tablet By Mouth Once A Day 2)  Carvedilol 12.5 Mg Tabs (Carvedilol) .... Take One Tablet By Mouth Twice A Day 3)  Apidra 100 Unit/ml Soln (Insulin Glulisine) .... As Directed Subcutaneously Three Times A Day 4)  Aspir-Low 81 Mg Tbec (Aspirin) .... Take 1 Tablet By Mouth Once A Day 5)  Benicar 40 Mg Tabs (Olmesartan Medoxomil) .... Take 1 Tablet By Mouth Once A Day 6)  Catapres-Tts-3 0.3 Mg/24hr Ptwk (Clonidine Hcl) .... Change Patch Weekly 7)  Zinc 50 Mg Tabs (Zinc) .... Take 1 Tablet By Mouth Once A Day 8)  Cardura Xl 8 Mg Xr24h-Tab (Doxazosin Mesylate) .... Take 1 Tablet By Mouth Once A Day 9)  Lantus 100 Unit/ml Soln  (Insulin Glargine) .... Use As Directed Subcutaneously Daily 10)  Plavix 75 Mg Tabs (Clopidogrel Bisulfate) .... Take 1 Tablet By Mouth Once A Day 11)  Renagel 800 Mg Tabs (Sevelamer Hcl) .... Take 1-4 Tabs By Mouth Four Times A Day 12)  Sensipar 30 Mg Tabs (Cinacalcet Hcl) .... Take 1 Tablet By Mouth Once A Day 13)  Simvastatin 40 Mg Tabs (Simvastatin) .... Take 1 Tablet By Mouth Once A Day 14)  Omeprazole 20 Mg Cpdr (Omeprazole) .... Take 1 Tablet By Mouth Once A Day 15)  Dialyvite  Tabs (B Complex-C-Folic Acid) .... Take 1 Tablet By Mouth Once A Day 16)  Hydralazine Hcl 50 Mg Tabs (Hydralazine Hcl) .... Take 1 Tablet By Mouth Two Times A Day 17)  Furosemide 40 Mg Tabs (Furosemide) .... Take One Tablet By Mouth Daily.  Allergies: 1)  ! * Narcotics Pills  Comments:  Nurse/Medical Assistant: The patient's medication list and allergies were reviewed with the patient and were updated in the Medication and Allergy Lists.  Past History:  Past Medical History: CAD  catheterization March, 2011. (presentation with mild CHF and mild increased troponin).. 70% proximal/80% mid LAD, 50% circumflex.Marland KitchenLAD could be approached but with difficulty....Marland Kitchen medical therapy recommended / NSTEMI  01/31/2010 with sepsis and anemia...Marland KitchenMarland KitchenEF 50%....medical Rx Diabetes hyperlipidemia.  aortic stenosis  mild to moderate... echo.. September, 2006 / catheterization valve area 1.9  cm square  /  mod/severe...Marland KitchenMarland Kitchenecho...10/2009  /   mod/severe...echo...01/31/2010 EF  35%....catheterization.... 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  anemia.  CVA.  ESRD  .Marland Kitchen. dialysis Carotid artery disease.... Doppler... May 06, 2009 moderate plaque.... less than 50% stenosis bilateral Edema..     Review of Systems       Patient denies fever, chills, headache, sweats, rash, change in vision, change in hearing, chest pain, cough, nausea  vomiting, urinary symptoms.  All other systems are reviewed and are negative.  Vital Signs:  Patient profile:   70 year old female Height:      64 inches Weight:      193 pounds BMI:     33.25 Pulse rate:   62 / minute BP sitting:   182 / 91  (right arm) Cuff size:   large  Vitals Entered By: Carlye Grippe (April 03, 2010 10:35 AM)  Nutrition Counseling: Patient's BMI is greater than 25 and therefore counseled on weight management options.  Physical Exam  General:  patient looks quite good today.  She is overweight. Head:  head is atraumatic. Eyes:  no xanthelasma. Neck:  No jugular venous distention., there is bilateral radiated murmurs and probable bruits Chest Wall:  no chest wall tenderness. Lungs:  lungs are clear.  Respiratory effort is nonlabored. Heart:  cardiac exam reveals a fullness to GERD or no clicks or significant murmurs. Abdomen:  abdomen soft. Msk:  no musculoskeletal deformities. Extremities:  no peripheral edema. Skin:  no skin rashes. Psych:  patient is oriented to person time and place.  Affect is normal.   Impression & Recommendations:  Problem # 1:  EDEMA (ICD-782.3) The patient does not have any significant edema at this time.  No change in therapy.  Problem # 2:  EF  50% The patient had decreased ejection fraction in the past.  Recently in the hospital her ejection fraction was 50% despite having a troponin rise.  No change in her meds at this time.  Problem # 3:  CAD (ICD-414.00) The patient had a troponin rise when she was septic.  Ejection fraction remains 50%.  She is not having any significant symptoms.  No change in therapy at this time.  I feel she does not need any further ischemia evaluation at this point.  Problem # 4:  RENAL INSUFFICIENCY (ICD-588.9) The patient is on dialysis.  Problem # 5:  thisCAROTID BRUIT (ICD-785.9) Patient has known carotid artery disease.  She has radiated murmur and bruits and she needs followup  Doppler.  Problem # 6:  ESSENTIAL HYPERTENSION, BENIGN (ICD-401.1) Blood pressure is high today.  This will need followup at dialysis and with her primary team.  Problem # 7:  AORTIC VALVE DISORDERS (ICD-424.1) Patient has significant aortic valvular disease.  This will be followed at this time.  Other Orders: Carotid Duplex (Carotid Duplex)  Patient Instructions: 1)  Follow up with Dr. Myrtis Ser on  [facility], July 17, 2010 AT 10AM. 2)  Your physician has requested that you have a carotid duplex. This test is an ultrasound of the carotid arteries in your neck. It looks at blood flow through these arteries that supply the brain with blood. Allow one hour for this exam. There are no restrictions or special instructions. DUE IN APRIL. If the results of your test are normal or stable, you will receive a letter. If they are abnormal, the nurse will contact you by  phone.  3)  Your physician recommends that you continue on your current medications as directed. Please refer to the Current Medication list given to you today.

## 2010-04-11 NOTE — Miscellaneous (Signed)
  Clinical Lists Changes  Observations: Added new observation of PAST MED HX: CAD  catheterization March, 2011. (presentation with mild CHF and mild increased troponin).. 70% proximal/80% mid LAD, 50% circumflex.Marland KitchenLAD could be approached but with difficulty....Marland Kitchen medical therapy recommended diabetes.  /  NSTEMI  01/31/2010 with sepsis and anemia...Marland KitchenMarland KitchenEF 50%....medical Rx hyperlipidemia.  aortic stenosis  mild to moderate... echo.. September, 2006 / catheterization valve area 1.9 cm square  /  mod/severe...Marland KitchenMarland Kitchenecho...10/2009  /   mod/severe...echo...01/31/2010 EF  35%....catheterization.... 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  anemia.  CVA.  ESRD  .Marland Kitchen. dialysis Carotid artery disease.... Doppler... May 06, 2009 moderate plaque.... less than 50% stenosis bilateral Edema     (04/02/2010 17:24) Added new observation of PRIMARY MD: Aurther Loft Daniel,MD (04/02/2010 17:24)       Past History:  Past Medical History: CAD  catheterization March, 2011. (presentation with mild CHF and mild increased troponin).. 70% proximal/80% mid LAD, 50% circumflex.Marland KitchenLAD could be approached but with difficulty....Marland Kitchen medical therapy recommended diabetes.  /  NSTEMI  01/31/2010 with sepsis and anemia...Marland KitchenMarland KitchenEF 50%....medical Rx hyperlipidemia.  aortic stenosis  mild to moderate... echo.. September, 2006 / catheterization valve area 1.9 cm square  /  mod/severe...Marland KitchenMarland Kitchenecho...10/2009  /   mod/severe...echo...01/31/2010 EF  35%....catheterization.... 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  anemia.  CVA.  ESRD  .Marland Kitchen. dialysis Carotid artery disease.... Doppler... May 06, 2009 moderate plaque.... less than 50% stenosis bilateral Edema

## 2010-04-12 LAB — POCT I-STAT, CHEM 8
Calcium, Ion: 1.13 mmol/L (ref 1.12–1.32)
Chloride: 103 mEq/L (ref 96–112)
HCT: 37 % (ref 36.0–46.0)
Hemoglobin: 12.6 g/dL (ref 12.0–15.0)
TCO2: 29 mmol/L (ref 0–100)

## 2010-04-13 ENCOUNTER — Other Ambulatory Visit (HOSPITAL_COMMUNITY): Payer: Self-pay | Admitting: Nephrology

## 2010-04-13 DIAGNOSIS — N186 End stage renal disease: Secondary | ICD-10-CM

## 2010-04-23 LAB — BASIC METABOLIC PANEL
BUN: 61 mg/dL — ABNORMAL HIGH (ref 6–23)
CO2: 33 mEq/L — ABNORMAL HIGH (ref 19–32)
Calcium: 8.6 mg/dL (ref 8.4–10.5)
Chloride: 94 mEq/L — ABNORMAL LOW (ref 96–112)
Chloride: 99 mEq/L (ref 96–112)
GFR calc Af Amer: 7 mL/min — ABNORMAL LOW (ref >60)
GFR calc non Af Amer: 5 mL/min — ABNORMAL LOW (ref >60)
GFR calc non Af Amer: 6 mL/min — ABNORMAL LOW (ref >60)
Glucose, Bld: 75 mg/dL (ref 70–99)
Potassium: 4.8 mEq/L (ref 3.5–5.1)
Potassium: 5.5 mEq/L — ABNORMAL HIGH (ref 3.5–5.1)
Potassium: 5.5 mEq/L — ABNORMAL HIGH (ref 3.5–5.1)
Sodium: 133 mEq/L — ABNORMAL LOW (ref 135–145)
Sodium: 134 mEq/L — ABNORMAL LOW (ref 135–145)
Sodium: 135 mEq/L (ref 135–145)

## 2010-04-23 LAB — RENAL FUNCTION PANEL
BUN: 75 mg/dL — ABNORMAL HIGH (ref 6–23)
CO2: 24 mEq/L (ref 19–32)
CO2: 27 mEq/L (ref 19–32)
Chloride: 101 mEq/L (ref 96–112)
Chloride: 97 mEq/L (ref 96–112)
Creatinine, Ser: 9.02 mg/dL — ABNORMAL HIGH (ref 0.4–1.2)
GFR calc Af Amer: 5 mL/min — ABNORMAL LOW (ref >60)
GFR calc non Af Amer: 4 mL/min — ABNORMAL LOW (ref >60)
Glucose, Bld: 121 mg/dL — ABNORMAL HIGH (ref 70–99)
Phosphorus: 5 mg/dL — ABNORMAL HIGH (ref 2.3–4.6)
Potassium: 6 mEq/L — ABNORMAL HIGH (ref 3.5–5.1)
Sodium: 134 mEq/L — ABNORMAL LOW (ref 135–145)

## 2010-04-23 LAB — POCT I-STAT 3, ART BLOOD GAS (G3+)
O2 Saturation: 97 %
TCO2: 34 mmol/L (ref 0–100)
pCO2 arterial: 46.6 mmHg — ABNORMAL HIGH (ref 35.0–45.0)
pH, Arterial: 7.457 — ABNORMAL HIGH (ref 7.350–7.400)
pO2, Arterial: 84 mmHg (ref 80.0–100.0)

## 2010-04-23 LAB — CBC
HCT: 28.5 % — ABNORMAL LOW (ref 36.0–46.0)
HCT: 30.1 % — ABNORMAL LOW (ref 36.0–46.0)
HCT: 30.3 % — ABNORMAL LOW (ref 36.0–46.0)
HCT: 32.6 % — ABNORMAL LOW (ref 36.0–46.0)
Hemoglobin: 9.6 g/dL — ABNORMAL LOW (ref 12.0–15.0)
Hemoglobin: 9.9 g/dL — ABNORMAL LOW (ref 12.0–15.0)
MCHC: 33.6 g/dL (ref 30.0–36.0)
MCV: 92.6 fL (ref 78.0–100.0)
MCV: 92.9 fL (ref 78.0–100.0)
MCV: 93.2 fL (ref 78.0–100.0)
MCV: 93.3 fL (ref 78.0–100.0)
Platelets: 155 10*3/uL (ref 150–400)
RBC: 3.05 MIL/uL — ABNORMAL LOW (ref 3.87–5.11)
RBC: 3.36 MIL/uL — ABNORMAL LOW (ref 3.87–5.11)
RBC: 3.51 MIL/uL — ABNORMAL LOW (ref 3.87–5.11)
RDW: 17.5 % — ABNORMAL HIGH (ref 11.5–15.5)
WBC: 7.8 10*3/uL (ref 4.0–10.5)
WBC: 9.4 10*3/uL (ref 4.0–10.5)
WBC: 9.6 10*3/uL (ref 4.0–10.5)
WBC: 9.9 10*3/uL (ref 4.0–10.5)

## 2010-04-23 LAB — GLUCOSE, CAPILLARY
Glucose-Capillary: 104 mg/dL — ABNORMAL HIGH (ref 70–99)
Glucose-Capillary: 115 mg/dL — ABNORMAL HIGH (ref 70–99)
Glucose-Capillary: 118 mg/dL — ABNORMAL HIGH (ref 70–99)
Glucose-Capillary: 135 mg/dL — ABNORMAL HIGH (ref 70–99)
Glucose-Capillary: 155 mg/dL — ABNORMAL HIGH (ref 70–99)
Glucose-Capillary: 93 mg/dL (ref 70–99)

## 2010-04-23 LAB — MRSA PCR SCREENING: MRSA by PCR: NEGATIVE

## 2010-04-23 LAB — POCT I-STAT 3, VENOUS BLOOD GAS (G3P V)
pCO2, Ven: 53 mmHg — ABNORMAL HIGH (ref 45.0–50.0)
pO2, Ven: 34 mmHg (ref 30.0–45.0)

## 2010-04-23 LAB — HEPATITIS B SURFACE ANTIGEN: Hepatitis B Surface Ag: NEGATIVE

## 2010-04-24 ENCOUNTER — Ambulatory Visit (HOSPITAL_COMMUNITY)
Admission: RE | Admit: 2010-04-24 | Discharge: 2010-04-24 | Disposition: A | Discharge status: Home / Self Care | Payer: Medicare Other | Source: Ambulatory Visit | Attending: Nephrology | Admitting: Nephrology

## 2010-04-24 ENCOUNTER — Other Ambulatory Visit: Payer: Self-pay | Admitting: Cardiology

## 2010-04-24 DIAGNOSIS — Y832 Surgical operation with anastomosis, bypass or graft as the cause of abnormal reaction of the patient, or of later complication, without mention of misadventure at the time of the procedure: Secondary | ICD-10-CM | POA: Insufficient documentation

## 2010-04-24 DIAGNOSIS — N186 End stage renal disease: Secondary | ICD-10-CM

## 2010-04-24 DIAGNOSIS — T82898A Other specified complication of vascular prosthetic devices, implants and grafts, initial encounter: Secondary | ICD-10-CM | POA: Insufficient documentation

## 2010-04-24 DIAGNOSIS — D649 Anemia, unspecified: Secondary | ICD-10-CM | POA: Insufficient documentation

## 2010-04-24 DIAGNOSIS — I120 Hypertensive chronic kidney disease with stage 5 chronic kidney disease or end stage renal disease: Secondary | ICD-10-CM | POA: Insufficient documentation

## 2010-04-24 MED ORDER — IOHEXOL 300 MG/ML  SOLN
100.0000 mL | Freq: Once | INTRAMUSCULAR | Status: AC | PRN
  Administered 2010-04-24: 64 mL via INTRAVENOUS

## 2010-04-27 NOTE — Telephone Encounter (Signed)
[address] °

## 2010-04-29 ENCOUNTER — Other Ambulatory Visit: Payer: Self-pay | Admitting: *Deleted

## 2010-04-29 MED ORDER — FUROSEMIDE 40 MG PO TABS: 40.0000 mg | ORAL_TABLET | Freq: Every day | ORAL | Status: DC

## 2010-05-09 ENCOUNTER — Encounter: Payer: Self-pay | Admitting: Cardiology

## 2010-05-30 ENCOUNTER — Other Ambulatory Visit: Payer: Self-pay | Admitting: Cardiology

## 2010-06-13 NOTE — Consult Note (Signed)
Melissa [name], Melissa [name]                 ACCOUNT NO.:  [id number]   MEDICAL RECORD NO.:  [id number]          PATIENT TYPE:  INP   LOCATION:  A204                          FACILITY:  [facility]   PHYSICIAN:  Jorja Loa, M.D.DATE OF BIRTH:  1940-03-19   DATE OF CONSULTATION:  02/19/2007  DATE OF DISCHARGE:                                 CONSULTATION   REASON FOR CONSULTATION:  End-stage renal disease for hemodialysis.   Melissa [name] is a 70 year old Caucasian female who is well-known to me who  has a history of diabetes, history of coronary artery disease, history  of also mild to moderate aortic stenosis, end-stage renal disease on  maintenance hemodialysis who presently came to the hospital with  complaints of severe weakness associated with cough and nasal  stuffiness.  According to the patient she was feeling weak more than  usual.  Melissa [name] has been complaining of weakness for a couple of  months even before we started her on dialysis.  After she was started on  dialysis she started feeling better, but recently was complaining of  weakness, but however, there is no other finding to explain her  weakness.  Mainly she does not have any shortness of breath.  No  dizziness.  Appetite was okay and her hemoglobin and hematocrit which  were low has improved.  But she states in the last 2 days she started  having cough, sputum production, nasal stuffiness and her weakness got  worse to the point where she has a very difficult time to walk and she  went to her primary care physician.  She was given some medications  which did not help her and hence she came to the emergency room where  she is admitted now.  At this moment she states that she is feeling  better.  She had her dialysis yesterday.  She does not have any  dizziness or lightheadedness.   PAST MEDICAL HISTORY:  1. She has history of coronary artery disease.  2. History of diabetes.  3. History of hyperlipidemia.  4. History of mild  to moderate aortic stenosis.  5. 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  6. History of hypertension.  7. History of anemia.  8. History of CVA.  9. End-stage renal disease on maintenance hemodialysis Tuesday,      Thursday and Saturday.   PAST SURGICAL HISTORY:  1. She had cataract surgery.  2. She had retinal surgery.  3. She had also appendectomy.   CURRENT MEDICATIONS:  1. Epogen as an outpatient.  2. Aspirin 81 mg p.o. daily.  3. Sensipar 50 mg p.o. daily.  4. Clonidine 0.2 mg p.o. b.i.d.  5. Plavix 75 mg p.o. daily.  6. Diltiazem 360 mg p.o. daily.  7. Doxazosin 80 mg p.o. daily.  8. Novolin insulin.  9. Avapro 300 mg p.o. daily.  10.Levofloxacin 75 mg p.o. daily.  11.Protonix 40 mg p.o. daily.  12.Renagel 2400 mg p.o. daily.  13.Zocor 80 mg p.o. daily.  14.She is getting IV fluids at 50 mL per hour.  15.She was given vancomycin.   ALLERGIES:  SHE IS ALLERGIC ONLY TO CODEINE.  Most of the time  __________  the patient.  Most of the medication which is given for pain  cause confusion, hence she could not take most of the medications  including Neurontin.   SOCIAL HISTORY:  She lives alone.  She has a very supportive daughter  and sons.   FAMILY HISTORY:  Father died from accident and mother coronary artery  disease at the age of 57 and also there is a history of diabetes.   REVIEW OF SYSTEMS:  Nasal stuffiness, some cough.  No significant sputum  production but she has some.  She denies any chest pain, no shortness of  breath but her main complaint seems to be weakness in both legs and  presently she is feeling better.  She denies any nausea, no vomiting.  Appetite is okay.   PHYSICAL EXAMINATION:  The patient is alert in no apparent distress.  Her blood pressure is 155/75, temperature 101.6 and her pulse is 77.  CHEST:  Decreased breath sounds, otherwise seems to be clear.  No rales  or rhonchi.  She has some wheezing.   HEART:  Revealed regular rate and rhythm.  ABDOMEN:  Soft, positive bowel sounds.  EXTREMITIES:  No edema.   Her white blood cell count is 5.6, hemoglobin 11.9, hematocrit 35.5,  platelet of 202.  Sodium 135, potassium 4.3, BUN is 27, creatinine 4.12.  Her albumin is 3.1.  CPK 972, troponin is 0.14.   ASSESSMENT:  1. End-stage.  She is status post hemodialysis yesterday.  BUN and      creatinine seems to be within acceptable range, normal potassium.      She does not have any sign of fluid overload.  2. Weakness.  This has been going on for some time, etiology not      clear.  Lots of her workup did not show anything.  At one moment      neuropathy was entertained.  She was put on Neurontin.  She would      not tolerate it.  She might have peripheral vascular disease.  She      is on Plavix.  At this moment this seems to be improving but this      has been an ongoing problem.  3. Fever with cough, probably a common cold.  She had her flu shot in      the dialysis unit, but at this moment need to rule out infection.      Agree with antibiotics.  4. Hypertension.  BP seems to be reasonably controlled.  She is on      multiple medications.  5. History of anemia.  She is on Epogen.  H&H is stable.  6. History of diabetes.  She is on insulin.  7. History of gastroesophageal reflux disease.  She is on Protonix.   RECOMMENDATIONS:  At this moment I will continue this present  management.  Probably we will hold the clonidine to see if __________  the Cardura whether that is causing the problem.Marland Kitchen  Hence, will  discontinue Cardura and increase the Catapres and see if that will help  with her weakness.  We will make arrangement for her to get dialysis  tomorrow and will DC her IV fluids. We will continue her other  medications.      Jorja Loa, M.D.  Electronically Signed     BB/MEDQ  D:  02/19/2007  T:  02/19/2007  Job:  [id number]

## 2010-06-13 NOTE — Group Therapy Note (Signed)
NAME:  Melissa [name], BOSLER NO.:  [id number]   MEDICAL RECORD NO.:  [id number]          PATIENT TYPE:  INP   LOCATION:  A204                          FACILITY:  [facility]   PHYSICIAN:  Skeet Latch, DO    DATE OF BIRTH:  [birth year]   DATE OF PROCEDURE:  02/19/2007  DATE OF DISCHARGE:                                 PROGRESS NOTE   SUBJECTIVE:  Ms. Wiker is complaining of left shoulder pain today.  The  patient states that she sustained this after having a fall at home.  The  patient is also complaining of abdominal tenderness.  Denies any chest  pain, nausea, or vomiting at this time.  Overall the patient states that  she feels slightly improved.   OBJECTIVE:  VITAL SIGNS:  Temperature is 101.6.  Pulse 77.  Respirations  25.  Blood pressure 165/75.  She is satting 93% on room air.  GENERAL:  This is a 70 year old Caucasian female who is well developed,  well hydrated, well-nourished, who is pleasant, cooperative in no acute  distress.  CARDIOVASCULAR:  Regular rate and rhythm.  She has a 2-3/6 systolic  murmur noted.  No rubs or gallops.  LUNGS:  Clear to auscultation bilaterally.  No rales, rhonchi, or  wheezing.  ABDOMEN:  She has lower abdominal pain and deep palpation.  No rigidity  or rebound tenderness is noted.  EXTREMITIES:  No clubbing, cyanosis, or edema.  NEUROLOGIC:  Some mild right-sided weakness that seems to be chronic.  Cranial nerves 2-12 are grossly intact.   LABORATORY DATA:  TSH is 1.65.  Blood cultures so far are negative.  Troponin 0.14.  Total creatinine kinase is 972.  CK MB is 1.7.  Sodium  135, potassium 4.3.  Chloride is 99.  CO2 28, glucose 80, BUN 27,  creatinine 4.12.  White count is 5.6.  Hemoglobin 11.9.  Hematocrit  35.5.  Platelets 202.  Recent scan showed:  1.  Left lower lobe  pneumonia.  2.  Distended gallbladder.  3.  Distended bladder.   ASSESSMENT AND PLAN:  1. Prior weakness.  I will get a physical therapy and occupational      therapy evaluation to see if her weakness slowly improves and to      see where her baseline is.  2. For her end-stage renal disease, the patient will be continued on      dialysis.  She is being followed by nephrology.  Continue to follow      her BUN and creatinine at this time.  3. For her pneumonia the patient has been placed on Levaquin and      vancomycin.  Will continue to follow blood cultures and await      sputum culture and sensitivity.  4. Fever could be secondary to the pneumonia.  Will continue to      follow.  Will give pyretics at this time.  5. For hypertension, continue her current medications.  6. For anemia she is on Epogen.  Continue to follow her hemoglobin and      hematocrit.  7. For her diabetes continue current treatment.      Skeet Latch, DO  Electronically Signed     SM/MEDQ  D:  02/19/2007  T:  02/19/2007  Job:  [id number]

## 2010-06-13 NOTE — Assessment & Plan Note (Signed)
OFFICE VISIT   [name], [name]  DOB:  26-Jun-1940                                       08/21/2006  CHART#:[id number]   I saw 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 She had a new left forearm AV graft placed by Dr. Arbie Cookey  on August 14, 2006. She has had some tingling in her fingers and was sent  over by the dialysis center. She complains of some tingling in the  forefinger, and middle finger, and occasional pain in the hand, although  this comes and goes somewhat.   PHYSICAL EXAMINATION:  Blood pressure is 150/65, heart rate is 62. The  graft in her left forearm has a good bruits and thrill. She has a radial  ulnar and palmar arch signal with the Doppler. These signals do augment  with compression of her graft.   I think she does have a mild Steal. Currently however her hand appears  warm and adequately perfused. I have explained that really the only  option would be to ligate or remove her graft if her symptoms worsen.  However her symptoms are quite tolerable currently and so she will  continue to give this some time. If her symptoms do not improve or  worsen she will let us know so we can consider ligation of her graft or  removal of her graft.   Di Kindle. Edilia Bo, M.D.  Electronically Signed   CSD/MEDQ  D:  08/21/2006  T:  08/22/2006  Job:  [id number]

## 2010-06-13 NOTE — Group Therapy Note (Signed)
NAME:  Melissa [name], Melissa [name] NO.:  [id number]   MEDICAL RECORD NO.:  [id number]          PATIENT TYPE:  INP   LOCATION:  A204                          FACILITY:  [facility]   PHYSICIAN:  Skeet Latch, DO    DATE OF BIRTH:  28-Sep-1940   DATE OF PROCEDURE:  02/23/2007  DATE OF DISCHARGE:                                 PROGRESS NOTE   SUBJECTIVE:  Melissa [name] states that she was doing well until having a  hemodialysis and states that she is having some pain in her lower back.  The patient states her breathing is slightly improved but still has some  coarse raspy sounds.  Overall states that she feels a little bit better.  The patient has not been ambulating as much as she needs to at this  time.   OBJECTIVE:  VITAL SIGNS:  Temperature is 90.5, pulse 65, respirations  12, blood pressure 164/71.  She is satting 95% on room air.  HEART:  Regular rate and rhythm.  No murmurs, rubs or gallops.  LUNGS:  She has bilateral wheezing noted with some slight rhonchi.  No  rales.  ABDOMEN:  Obese, soft, nontender, nondistended.  Positive bowel sounds.  EXTREMITIES:  No clubbing, cyanosis or edema.   LABORATORY DATA:  So far blood cultures are negative.  White count 6.1,  hemoglobin 12.4, hematocrit 37.1, platelets 144.  Sodium 137, potassium  3.7, chloride 102, CO2 28, glucose 42, BUN 29, creatinine 3.83.   ASSESSMENT/PLAN:  1. End-stage renal disease.  The patient had hemodialysis yesterday.      She is not fluid overloaded at this time.  The patient's weakness      seems to be slightly improving.  2. As for her anemia.  The patient continues on Epogen.  Will continue      to monitor that.  3. For urinary tract infection the patient is on Levaquin and      vancomycin.  This should be continued at this time.  4. As for hypertension.  She is to continue her current medications at      this time.  5. For her diabetes.  The patient is on sliding scale.  This will be      continued.   Will continue to follow her sugars closely.   The patient's daughter's spoke with case management about possible  temporary placement on Friday.  This needs to be reevaluated tomorrow.  Then patient positive to be discharged next 24-48 hours.  Will await  placement.      Skeet Latch, DO  Electronically Signed     SM/MEDQ  D:  02/23/2007  T:  02/24/2007  Job:  [id number]

## 2010-06-13 NOTE — Assessment & Plan Note (Signed)
[facility]                          [facility] OFFICE NOTE   NAME:[name], Melissa JONSSON                        MRN:          [id number]  DATE:01/09/2007                            DOB:          1940-04-09    CANCELLED DICTATION     Luis Abed, MD, [facility] [place]  Electronically Signed    JDK/MedQ  DD: 01/10/2007  DT: 01/11/2007  Job #: [id number]

## 2010-06-13 NOTE — Op Note (Signed)
NAME:  Melissa [name], Melissa [name]                 ACCOUNT NO.:  [id number]   MEDICAL RECORD NO.:  [id number]          PATIENT TYPE:  AMB   LOCATION:  SDS                          FACILITY:  [facility]   PHYSICIAN:  Larina Earthly, M.D.    DATE OF BIRTH:  1940-11-13   DATE OF PROCEDURE:  06/25/2006  DATE OF DISCHARGE:                               OPERATIVE REPORT   PREOPERATIVE DIAGNOSIS:  End-stage renal disease.   POSTOPERATIVE DIAGNOSIS:  End-stage renal disease.   PROCEDURE:  Right IJ (internal jugular) Diatek catheter insertion with  ultrasound visualization.   SURGEON:  Larina Earthly, M.D.   ASSISTANT:  Nurse   ANESTHESIA:  MAC.   COMPLICATIONS:  None.   DISPOSITION:  To the recovery room stable.   PROCEDURE IN DETAIL:  The patient was taken to the operating room and  was placed in the supine position.  The area of the right and left neck  were imaged with ultrasound revealing widely patent jugular veins  bilaterally.  The patient was placed in Trendelenburg position.  The  right and left neck and chest were prepped and draped in the usual  sterile fashion.  Using local anesthesia and a finder needle, the right  internal jugular vein was identified.  Next, using the Seldinger  technique, a guidewire was passed down to the level of the right atrium.  Dilator was passed over the guidewire and dilator peel-away sheath was  then passed over the guidewire.  The dilator and guidewire were removed.  A 28-cm Diatek catheter was passed through the peel-away sheath which  was then removed as well.  The catheter was brought through the  subcutaneous tunnel and the 2 lumen ports were attached.  Catheter  flushed and aspirated easily and were locked with 1000 units/mL heparin.  The catheter was secured to skin with 3-0 nylon stitch on either side as  well as 4-0 subcuticular Vicryl stitch.  Sterile dressing was applied.  The patient was taken to the recovery room in stable condition.      Larina Earthly, M.D.  Electronically Signed     TFE/MEDQ  D:  06/25/2006  T:  06/25/2006  Job:  [id number]

## 2010-06-13 NOTE — Group Therapy Note (Signed)
NAME:  Melissa, [name] NO.:  [id number]   MEDICAL RECORD NO.:  [id number]          PATIENT TYPE:  INP   LOCATION:  A204                          FACILITY:  [facility]   PHYSICIAN:  Skeet Latch, DO    DATE OF BIRTH:  1940/05/10   DATE OF PROCEDURE:  02/21/2007  DATE OF DISCHARGE:                                 PROGRESS NOTE   SUBJECTIVE:  [name] Seems to be a little improved today.  The patient  is still complaining of some slight wheezing and shortness of breath.  Overall, she states she is feeling better.  She does not have any chest  pain or abdominal pain at this time.   OBJECTIVE:  VITAL SIGNS:  Temperature 97.5, pulse 68, respirations 24,  blood pressure 148/70.  Sats 96% on 3L.   PHYSICAL EXAM:  NECK:  Soft, supple.  Nontender, nondistended.  CHEST:  She has some wheezing noted.  Slight rhonchi.  No rales.  HEART:  Regular rate and rhythm.  No murmurs.  ABDOMEN:  Soft, nontender, nondistended.  Positive bowel sounds.  EXTREMITIES:  No cyanosis, clubbing, or edema noted.   LABS:  BNP is 656, sodium 124, potassium 3.7, chloride 191, CO2 23,  glucose 197.  BUN 73, creatinine 1.65, white count 8.9, hemoglobin 9.8,  hematocrit 29.1, platelets 318,000.   ASSESSMENT AND PLAN:  1. Renal insufficiency.  Seems to be improving.  Nephrology has been      consulted and saw the patient.  2. Hyponatremia.  Probably from intravascular depletion.  Needs to be      fluid-restricted.  3. Diabetes.  She is on sliding scale.  Continue to monitor.  4. Hypertension.  Seems to be stable.  Continue current treatment.      The patient with team now getting diuresed at this time.      Cardiology is also following the patient.  Appreciate their      recommendations will continue to follow.      Skeet Latch, DO  Electronically Signed     SM/MEDQ  D:  02/21/2007  T:  02/21/2007  Job:  [id number]

## 2010-06-13 NOTE — Consult Note (Signed)
NAME:  Melissa [name], BOYE                 ACCOUNT NO.:  [id number]   MEDICAL RECORD NO.:  [id number]          PATIENT TYPE:  INP   LOCATION:  A204                          FACILITY:  [facility]   PHYSICIAN:  Noralyn Pick. Eden Emms, MD, FACCDATE OF BIRTH:  10/26/1940   DATE OF CONSULTATION:  DATE OF DISCHARGE:                                 CONSULTATION   REASON FOR CONSULTATION:  Melissa [name] is a 66-year patient of Dr. Myrtis Ser.  We are asked to see her for presyncope.   HISTORY OF PRESENT ILLNESS:  The patient is a chronic dialysis patient.   She had dialysis earlier in the day; about 3 hours later at home she  tried to get up and lost her balance.  She fell weak in the knees.  In  talking to the patient there was really no generalized dizziness.  She  just had weakness.  I suspect it had to do with her dialysis.  I do not  have her records to see how much fluid they drew or what her weights  have been.   The patient has not had any generalized fever.  There has been no  evidence of sepsis.  She has not had postural symptoms.  She tells me in  general her blood pressure does not fall low at dialysis.   Unfortunately I do not have a lot of records on the patient.  She does  have mild to moderate aortic stenosis with a good ejection fraction by  echo in December.   She saw Dr. Myrtis Ser in the office December 11th who thought her heart was  fine.   The patient is an insulin-dependent diabetic.  Apparently there was no  hypoglycemia at the time.   There is a history of CVA back in 2007 with normal carotids.  She  currently does not have a significant right hemiparesis.   In talking to the patient, she feels weak now; there is no focal sign.  She is afebrile.  She is not having chest pain.  There have been no  palpitations.  There has been no increase in lower extremity edema.   REVIEW OF SYSTEMS:  Her review of systems is otherwise negative.   PAST MEDICAL HISTORY:  Her past medical history includes a  history of  mild to moderate aortic stenosis followed by Dr. 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   PAST SURGICAL HISTORY:  Previous surgeries included appendectomy,  oophorectomy, left great toe amputation in 1992, previous laser surgery  for retinal eye problems and cataract surgery.   HER MEDICATIONS ON ADMISSION:  Included Benicar 40 a day, Cardura 8 a  daily, Renagel, Lasix 40 a day as needed she is not anuric, clonidine  0.2 b.i.d., Cardizem 360 a day, an aspirin a day, omeprazole 20 a day,  Plavix 75 a day, simvastatin 80 a day, Lantus 80 units subcu q.h.s.,  sliding-scale  coverage and Sensipar 30 a day.   ALLERGIES:  SHE IS ALLERGIC TO SOME PAIN MEDICINES WHICH CAUSE DELIRIUM.   SOCIAL HISTORY:  She is divorced and lives alone.  She has a life-alert  that she actually pressed to have the paramedics come get her.  She does  not smoke or drink.  She has 3 living sons and a daughter.  She is  fairly sedentary.  She does still drive.   FAMILY HISTORY:  Father died in his 54s in a car accident.  Mother died  at 39 of coronary disease.   REVIEW OF SYSTEMS:  Review of systems otherwise negative.   PHYSICAL EXAMINATION:  GENERAL:  Her exam is remarkable for a  chronically ill-appearing middle-aged white female.  She is in no  distress.  VITAL SIGNS:  Her current blood pressure is 155/65.  She is in sinus  rhythm rate of 80, respiratory rate is 16, afebrile.  Room air sats were  96%.  HEENT/NECK:  Unremarkable.  Carotids are normal.  There is no parvus and  no tardus, no JVP elevation, lymphadenopathy or thyromegaly.  LUNGS:  Clear diaphragmatic motion.  No wheezing.  HEART:  There is an S1 second heart sound that is  well preserved.  There  is a mild AS murmur.  PMI is normal.  ABDOMEN:  Benign, bowel sounds positive.  No AAA.  No tenderness.  No  hepatosplenomegaly.  No hepatojugular reflux.  She has striae across the  abdomen.  EXTREMITIES:  Distal pulses are intact.  She has a scar in the medial  aspect of the left leg.  She said this surgery was done to help her left  toe heal.  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  She has a fistula with a reasonable thrill on the left upper extremity.  NEUROLOGIC:  Neuro is nonfocal.  There is no residual from her TIA or  CVA.  SKIN:  Skin is warm and dry.  MUSCULOSKELETAL:  There is no obvious muscular weakness but I do not get  her up to ambulate her.   STUDIES/LABORATORY DATA:  The patient's EKG and was on the chart.  Her  telemetry shows sinus rhythm with probable LVH and nonspecific ST-T-wave  changes.   Her lab work is remarkable for no growth in her blood cultures for 24  hours, CPKs are negative with a troponin of 0.14, potassium is 4.3,  glucose is 80, BUN is 27, creatinine is 4.1.  White count is 5.6,  hematocrit is 35.5.  UA shows only 3 to 6 white blood cells per high-  powered field and hematocrit is 34.3.   Chest x-ray shows cardiomegaly with chronic bronchitic changes.  No  acute findings.   IMPRESSION:  1. Weakness.  I do not think this is related to her heart.  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  She has no focal      symptoms referable to her heart.  We will check a 2-D      echocardiogram to rule out sub-occult SBE, subacute bacterial      endocarditis, and reassess her aortic stenosis; and blood cultures      are pending.  2. Questionable history of coronary disease.  Her CPKs were negative.      Her troponin is nonspecifically elevated in a dialysis patient.  I      do not think this means anything.  There is no evidence of an acute      coronary syndrome.  3. Hypertension currently  well controlled.  Continue current      medications.  Apparently there are no hypotensive episodes on      dialysis.  4. Aortic stenosis.  Not nearly bad enough to cause weakness but may      be a __________ for subacute bacterial endocarditis.  I will check      a 2-D echocardiogram today to reassess gradients and recheck left      ventricular function.  5. Chronic renal failure.  Fistula seems to be in good shape.      Continue in-hospital dialysis.  Follow up with Dr. Kristian Covey.  6. Previous history of cerebrovascular accident.  Continue aspirin and      Plavix.  No evidence of recurrence.  No carotid disease by duplex      and no carotid bruits.  7. Overall I think the patient is stable from a cardiac perspective      and outside of an echocardiogram does not need further workup.      Noralyn Pick. Eden Emms, MD, [facility]  Electronically Signed     PCN/MEDQ  D:  02/19/2007  T:  02/19/2007  Job:  [id number]

## 2010-06-13 NOTE — Group Therapy Note (Signed)
NAME:  LASHELLE, KOY NO.:  [id number]   MEDICAL RECORD NO.:  [id number]          PATIENT TYPE:  INP   LOCATION:  A204                          FACILITY:  [facility]   PHYSICIAN:  Skeet Latch, DO    DATE OF BIRTH:  09/09/1940   DATE OF PROCEDURE:  02/21/2007  DATE OF DISCHARGE:                                 PROGRESS NOTE   Ms. Iannaccone is doing well today.  The patient complains of some wheezing-  type episodes and slight shortness of breath but much improved from the  other day.  The patient does not have any chest pain, abdominal pain at  this time, and is sitting up in her chair on my exam.   OBJECTIVE:  VITAL SIGNS:  Temperature is 99.1, pulse 65, respirations  17, blood pressure 150/63.  Satting 97% on room air.  CARDIOVASCULAR:  Regular rhythm, a 3/6 systolic ejection murmur.  No  rubs appreciated.  LUNGS:  Have wheezing, bilateral bases.  No rhonchi.  ABDOMEN:  Obese, soft, nontender, nondistended.  No rebound tenderness  or guarding.  EXTREMITIES:  No clubbing, cyanosis.  Trace edema is noted.   LABORATORIES:  Urine culture showed some gram-negative rods.  Sodium  136, potassium 4.4, chloride 103, CO2 28, glucose 87, BUN 34, creatinine  4.64.  White count 5.9, hemoglobin 11.2, hematocrit 33.2, platelets 160.   ASSESSMENT/PLAN:  1. Weakness.  The patient continues to have physical therapy with      slight progress.  We will continue at this time.  She thinks her      weakness overall is improving.  2. Renal insufficiency.  She is being followed by nephrology.  The      patient continues to be on hemodialysis and will remain on      vancomycin at this time.  Awaiting further culture results.  3. Fever.  The patient has been afebrile for the last few days.  We      will continue to monitor.  4. Hypertension.  This seems relatively stable.  Continue current      treatment.  5. Anemia.  She continues to be on Epogen.  We will continue to follow      her  hemoglobin and her hematocrit.      Skeet Latch, DO  Electronically Signed     SM/MEDQ  D:  02/21/2007  T:  02/21/2007  Job:  [id number]

## 2010-06-13 NOTE — Procedures (Signed)
OPERATIVE REPORT   Huot, Melissa [name]  DOB:  CHART#:[id number]   PREOPERATIVE DIAGNOSIS:  End stage renal disease.   POSTOPERATIVE DIAGNOSIS:  End stage renal disease.   PROCEDURES:  New left forearm Loop AV Gore-Tex graft.   SURGEON:  Larina Earthly, M.D.   ASSISTANT:  Nurse.   ANESTHESIA:  MAC.   COMPLICATIONS:  None.   DISPOSITION:  To recovery room stable.   DESCRIPTION OF PROCEDURE:  The patient was taken to the operating room,  placed supine and an area of the left arm was prepped and draped in  usual sterile fashion.  Using local anesthesia, incision was made over  the antecubital space, carried down to dissect the systolic vein which  was quite small, the brachial vein which was also small with essentially  a plexus around the brachial artery and the basilic vein which was of  better caliber.  A separate incision was made over the distal forearm.  Loop configuration tunnel was created, and a 6 mm standard Gore-Tex  stretch graft was brought through the tunnel.  The basilic vein was  occluded proximally and distally.  It was opened with 11 blade and  extended with standard Potts scissors.  The grafts were sewn end-to-side  of the vein with running 6-0 Prolene suture.  Clamps were removed from  the vein and the graft collapsed and had reoccluded.  Next, the brachial  artery was occluded proximally and distally and was opened with the 11  blade and extended with standard Potts scissors.  The graft was cut the  appropriate length.  Small arteriotomy was created.  The graft was sewn  end-to-side to the artery with running 6-0 Prolene suture.  Clamps  removed.  Excellent thrill was noted.  The wound was irrigated with  saline.  Hemostasis with electrocautery.  Wound was closed with 3-0  Vicryl in the subcutaneous subcuticular tissue, Benzoin and Steri-Strips  were applied.   Larina Earthly, M.D.  Electronically Signed   TFE/MEDQ  D:  08/14/2006  T:   08/14/2006  Job:  [id number]

## 2010-06-13 NOTE — Assessment & Plan Note (Signed)
[facility]                          [facility] OFFICE NOTE   NAME:Melissa [name], Melissa [name]                        MRN:          [id number]  DATE:11/27/2006                            DOB:          [birth year]    PRIMARY CARDIOLOGIST:  Dr. Willa [name].   REASON FOR VISIT:  Annual followup.   Since last seen here in the clinic, Melissa [name] reports no interim  development of signs/symptoms suggestive of unstable angina pectoris,  congestive heart failure, or syncope.   However, she subsequently was placed on hemodialysis in May, followed by  Dr. Kristian [name] for progressive renal failure.  Notably, she informs me  that she does continue to produce urine and is, therefore, on  maintenance Lasix.   Patient also informs me that she was subsequently hospitalized with a  stroke in December.  She was thus placed on Plavix at that time.  I  reviewed carotid Dopplers from that admission, and these revealed no  significant bilateral ICA stenosis.   From a clinical standpoint, she only reports feeling tired, but  specifically denies exertional dyspnea and, as noted above, no chest  discomfort.  She also denies any symptoms suggestive of intermittent  claudication.  However, she reports having had dizziness for years,  but with no recent falls or exacerbation of these symptoms.   Electrocardiogram today reveals NSR at 65 bpm with LAD and chronic,  nonspecific ST abnormalities.   CURRENT MEDICATIONS:  1. Lantus 80 units at bedtime.  2. Benicar 40 daily.  3. Diltiazem 180 daily.  4. Omeprazole 20 daily.  5. Sensipar 30 daily.  6. Clonidine 0.1 daily.  7. Plavix.  8. Lasix 40 daily.  9. Renagel 2400 t.i.d.  10.Simvastatin 80 daily.  11.Aspirin 81 daily.   PHYSICAL EXAM:  Blood pressure 159/63, pulse 65, regular, weight 194.  GENERAL:  A 70 year old female sitting upright in no distress.  HEENT:  Normocephalic, atraumatic.  NECK:  Palpable.  Bilateral carotid  pulses; bilateral carotid bruits.  No JVD.  LUNGS:  Clear to auscultation in all fields.  HEART:  Regular rate and rhythm (S1 S2).  A 3/6 crescendo-decrescendo  murmur with a preserved S2.  No diastolic blow.  ABDOMEN:  Soft, nontender.  EXTREMITIES:  No significant edema.  NEURO:  No focal deficit.   IMPRESSION:  1. Valvular heart disease.      a.     Mild/moderate aortic stenosis by 2D echocardiogram,       September, 2006.  2. Preserved left ventricular function.      a.     Moderate left ventricular hypertrophy.  3. End-stage renal disease.      a.     Currently on hemodialysis, followed by Dr. Kristian [name].  4. Nonobstructive cerebrovascular disease.  5. Status post stroke in December, 2007.      a.     Treated with Plavix.  6. Dyslipidemia.  7. Insulin-dependent diabetes mellitus.  8. Chronic lower extremity, improved on dialysis.  9. Hypertension.   PLAN:  1. Surveillance 2D echocardiogram for reassessment of severity of  aortic stenosis.  2. Fasting lipid profile, if none done in the recent past.  3. Schedule return clinic followup with myself and Dr. Myrtis [name] in 1      month, for review of echocardiogram results and further      recommendations.      Melissa Searing, PA-C  Electronically Signed      Melissa Abed, MD, [facility]  Electronically Signed   GS/MedQ  DD: 11/27/2006  DT: 11/28/2006  Job #: [id number]   cc:   Melissa [name]

## 2010-06-13 NOTE — Assessment & Plan Note (Signed)
OFFICE VISIT   MARINE, LEZOTTE  DOB:  03-Nov-1940                                       07/29/2006  CHART#:[id number]   The patient presents today for evaluation of permanent AV access.  I  placed a dialysis Diatek catheter approximately 3 weeks ago and she has  had good function of this.  She is right-handed.  She does have a 1+  radial pulse and a 2-3+ brachial pulse on her left arm.  She has very  small surface veins.  Discussed options with Melissa [name] including AV  graft/AV fistula at the wrist and antecubital space.  We will proceed  with ultrasound imaging at the time of her surgery and probable  exploration of her antecubital space with eventual placement of a left  upper arm fistula or left forearm loop AV GORE-TEX graft.  Explained the  potential of complications including recurrent thromboses of these as  well.  Surgery is scheduled for July 16 at her convenience.   Larina Earthly, M.D.  Electronically Signed   TFE/MEDQ  D:  07/29/2006  T:  07/30/2006  Job:  [id number]

## 2010-06-13 NOTE — Group Therapy Note (Signed)
NAME:  Melissa [name], Melissa [name] NO.:  [id number]   MEDICAL RECORD NO.:  [id number]          PATIENT TYPE:  INP   LOCATION:  A204                          FACILITY:  [facility]   PHYSICIAN:  Skeet Latch, DO    DATE OF BIRTH:  07-13-1940   DATE OF PROCEDURE:  02/20/2007  DATE OF DISCHARGE:                                 PROGRESS NOTE   SUBJECTIVE:  [name] states that her left shoulder pain is improved  with hot packs.  The patient states that she is feeling better and does  not complain of any chest pain, abdominal pain, shortness of breath.  Overall she is feeling better.   OBJECTIVE:  VITAL SIGNS:  Temperature is 98.9, pulse 57, respirations  24, blood pressure 172/61, satting 91% on room air.  CARDIOVASCULAR:  Normal S1, S2 with 2/6 to 3/6 systolic ejection murmur.  LUNGS:  Clear.  No rales, rhonchi or wheezing.  ABDOMEN:  Abdominal pain improved.  No rigidity, guarding.  No rebound  tenderness.  EXTREMITIES:  No clubbing, cyanosis or edema.   LABS:  Sodium 137, potassium 3.8, chloride 99, CO2 is 32, glucose 138,  BUN 19, creatinine 3.20, white count 4.2, hemoglobin 0.4, hematocrit  33.9, platelets 155,000.   ASSESSMENT/PLAN:  1. Weakness.  The patient continues to do physical therapy with slow      progress.  The patient probably will return home with home PT upon      discharge.  2.  End-stage renal disease.  The patient continues to      be followed by nephrology.  Her renal function is slowly improving.      Continue current treatment.  2. Fever.  The patient has had been afebrile the last few days.      Continue to monitor.  3. Hypertension seems to be fairly stable.  Continue to follow that.  4. Anemia.  She is on Epogen.  Continue to follow      hemoglobin/hematocrit.  Hopefully the patient can be discharged in      the next 24-48 hours.      Skeet Latch, DO  Electronically Signed     SM/MEDQ  D:  02/20/2007  T:  02/20/2007  Job:  [id number]

## 2010-06-13 NOTE — Assessment & Plan Note (Signed)
[facility]                          [facility] OFFICE NOTE   NAME:Melissa [name], Melissa STEINMILLER                        MRN:          [id number]  DATE:01/09/2007                            DOB:          1940-02-12    Ms. Foglesong is seen for followup today.  See the complete note of November 27, 2006.  She was doing well.  In fact, she is doing better now that  she is on dialysis.  When we saw her, we decided to proceed with a  followup lipid profile and it looks quite good.  In addition, a followup  2D echo was done to reassess her LV function and to assess her aortic  valve.  The echo shows that the ejection fraction is in the 50-55% range  and that the aortic stenosis remains mild to moderate.  With this in  mind, she can be followed in one year.  She is stable today.  She has  just come off dialysis and she is hoping to get home soon.   Her blood pressure was 165/63.  Her lungs were clear.  She was stable.   No further workup was needed.   Her meds were not changed.   I will see her in one year in followup.     Luis Abed, MD, [facility]  Electronically Signed    JDK/MedQ  DD: 01/09/2007  DT: 01/10/2007  Job #: [id number]   cc:   Donzetta Sprung

## 2010-06-13 NOTE — Procedures (Signed)
NAME:  Melissa [name], Melissa [name] NO.:  [id number]   MEDICAL RECORD NO.:  [id number]          PATIENT TYPE:  INP   LOCATION:  A204                          FACILITY:  [facility]   PHYSICIAN:  Pricilla Riffle, MD, FACCDATE OF BIRTH:  [birth year]   DATE OF PROCEDURE:  02/20/2007  DATE OF DISCHARGE:                                ECHOCARDIOGRAM   REFERRING PHYSICIANS:  Dr. Margo Aye and Noralyn Pick. Eden Emms, MD   TEST INDICATION:  A 70 year old with a history of end-stage renal  disease and aortic stenosis.  Test to evaluate LV function.   PROCEDURE:  A 2-D echo with echo Doppler.   FINDINGS:  1. Windows are somewhat difficult in endocardium and some views      difficult to see.  2. The left ventricle is small in size with an end-diastolic dimension      of 32 mm.  The interventricular septum is mildly thickened at 15      mm.  Posterior wall mildly thickened at 12 mm.  3. Left atrium is grossly normal right atrium and right ventricle are      normal.  4. The aortic valve is thickened and calcified.  There appears to be      mild-to-moderately restricted motion with a peak and mean gradient,      through the valves, of 39 and 24 mmHg respectively.  No aortic      insufficiency seen.  5. The mitral valve is mildly thickened and mildly calcified.  There      is mild-to-moderate annular calcification.  There is trace      insufficiency.  6. Pulmonic valve is not well seen.  7. Tricuspid valve is normal with trivial insufficiency.  8. Overall LV systolic function is normal with an LVEF of      approximately 60%.  Endocardium is difficult to see in all the      walls.  Note, there is moderate diastolic dysfunction.  9. RV EF is normal.  10.Trivial pericardial effusion is seen.      Pricilla Riffle, MD, [facility]  Electronically Signed     PVR/MEDQ  D:  02/20/2007  T:  02/21/2007  Job:  [id number]

## 2010-06-13 NOTE — Op Note (Signed)
Melissa [name], Melissa [name]                 ACCOUNT NO.:  [id number]   MEDICAL RECORD NO.:  [id number]          PATIENT TYPE:  AMB   LOCATION:  SDS                          FACILITY:  [facility]   PHYSICIAN:  Larina Earthly, M.D.    DATE OF BIRTH:  02-Apr-1940   DATE OF PROCEDURE:  08/14/2006  DATE OF DISCHARGE:                               OPERATIVE REPORT   PREOPERATIVE DIAGNOSIS:  End-stage renal disease.   POSTOPERATIVE DIAGNOSIS:  End-stage renal disease.   PROCEDURE:  Placement of left forearm loop arteriovenous Gore-Tex graft.   SURGEON:  Larina Earthly, M.D.   ASSISTANT:  Nurse.   ANESTHESIA:  MAC.   COMPLICATIONS:  None.   DISPOSITION:  To recovery room, stable.   PROCEDURE IN DETAIL:  The patient was taken to the operating room and  placed in the supine position, where the left arm was prepped and draped  in the usual sterile fashion.  Using local anesthesia, an incision was  made over the antecubital space and carried down to isolate the brachial  artery and the basilic vein.  The cephalic vein and brachial veins were  very small.  A separate incision was made over the distal  forearm and a  loop configuration tunnel was created and a 6-mm standard wall graft was  brought through the tunnel.  The basilic vein was occluded proximally  and distally and was opened with an 11 blade and extended longitudinally  with Potts scissors.  The graft was spatulated and sewn end-to-side to  the vein with a running 6-0 Prolene suture.  Graft was flushed with  heparinized saline and reoccluded.  Next, the brachial artery was  occluded proximally and distally and a small arteriotomy was created.  The graft was spatulated and sewn end-to-side to the artery with a  running 6-0 Prolene suture.  Clamps were removed and an excellent thrill  was noted.  The wound was irrigated with saline.  Hemostasis was  obtained with electrocautery.  Wounds were closed 3-0 Vicryl in the  subcutaneous and  subcuticular tissue and Benzoin and Steri-Strips were  applied.      Larina Earthly, M.D.  Electronically Signed     TFE/MEDQ  D:  08/14/2006  T:  08/15/2006  Job:  [id number]

## 2010-06-13 NOTE — Discharge Summary (Signed)
NAME:  Melissa [name], Melissa [name]                 ACCOUNT NO.:  [id number]   MEDICAL RECORD NO.:  [id number]          PATIENT TYPE:  INP   LOCATION:  A204                          FACILITY:  [facility]   PHYSICIAN:  Skeet Latch, DO    DATE OF BIRTH:  October 31, 1940   DATE OF ADMISSION:  02/18/2007  DATE OF DISCHARGE:  01/26/2009LH                               DISCHARGE SUMMARY   DISCHARGE DIAGNOSES:  1. End-stage renal disease.  2. Anemia.  3. Urinary tract infection.  4. Hypertension.  5. Insulin-dependent diabetes.  6. Weakness that is improved.  7. Aortic stenosis.  8. Coronary artery disease.  9. Hyperlipidemia.  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   BRIEF HOSPITAL COURSE:  This is a 70 year old Caucasian female who  presented with progressive weakness.  The patient spoke with her primary  care physician and told her that she beginning to have progressive  weakness and also told her nephrologist about being weak.  The patient  denied any chest pain or shortness of breath up until the day prior to  admission which she states that she is having progressive weakness.  The  patient did have an episode of a fall out of a chair and was not able to  get up, and for that reason she was brought to the emergency room for  evaluation.  The patient was seen in [facility] approximately 2  weeks prior for a similar situation, and he did multiple testing that  was unrevealing.  The patient was sent home..  The patient did have  dialysis on the date of admission.  The patient was seen, admitted to  the hospital, placed on her home medications.   Her initial labs showed a white count of 5.9, hemoglobin 11.4, platelets  181, neutrophils 85%.  Sodium 137, potassium 4.5, chloride 101, CO2 of  31, glucose 73, BUN 21, creatinine 3.44.  She had an influenza A and B  screen that was negative.  Her urine showed a small amount of blood.  No  nitrites or leukocytes.  Chest x-ray showed  cardiomegaly with pulmonary  venous hypertension, chronic bronchitic changes.   The patient was seen and admitted for her weakness.  The patient was  placed on IV hydration.  The patient still received her hemodialysis  treatments during her hospital stay.  The patient did have a fever upon  admission; it went up to 101.8.  The patient was started on IV  antibiotics.  Nephrology was consulted for her chronic renal failure and  to manage her dialysis care.  Also, cardiology was consulted for  presyncope.  They did not feel her fatigue was related to her heart but  more for post dialysis syndrome.  She had CPKs that were negative.  Her  troponin was not specifically elevated; not uncommon in dialysis  patients.  They peripherally followed the patient during her hospital  stay.  The patient's echocardiogram showed a trivial pericardial  effusion.  Overall, the ejection fraction was approximately 60%.  The  patient did receive physical therapy during the hospital stay and slowly  improved.  The patient is still slightly short of breath, bit she is  much improved since the date of admission.  At this time, we feel the  patient is stable enough to be discharged.   MEDICATIONS ON DISCHARGE:  1. Clonidine 0.2 mg twice daily.  2. Aspirin 81 mg daily.  3. Simvastatin 80 mg daily.  4. Doxazosin 8 mg daily.  5. Renagel 2400 t.i.d. with meals.  6. Plavix 75 mg daily.  7. Omeprazole 20 mg daily.  8. Furosemide 40 mg daily.  9. Benicar 40 mg daily.  10.Sensipar 30 mg daily.  11.Meclizine 12.5 mg as needed.  12.Lantus 80 units subcutaneous at bedtime.  13.Diltiazem 360 mg daily.  14.Apidra sliding scale insulin daily.   VITALS ON DISCHARGE:  Temperature is 98.9, pulse 53, respirations 18,  blood pressure 151/75.   LABORATORY DATA:  The last labs revealed a sodium of 136, potassium 4.5,  chloride 102, CO2 of 28, glucose 106, BUN 43, creatinine 5.02.  White  count 7.1, hemoglobin 11.0,  hematocrit 33.1, platelets 156.   CONDITION ON DISCHARGE:  Stable.  The patient discharged to home.   CONSULTANTS:  1. [facility].  2. Nephrology.   DISCHARGE INSTRUCTIONS:  The patient is to follow up with her primary  care physician in the next 7-10 days.  The patient is to follow up with  nephrology for her dialysis as scheduled.  The patient is to maintain a  renal diet.  She is to increase her activity slowly.  The patient is to  maintain her home health.  The patient is instructed if she has any  major for problems to contact her primary care physician or return to  the emergency room for evaluation.      Skeet Latch, DO  Electronically Signed     SM/MEDQ  D:  02/24/2007  T:  02/24/2007  Job:  [id number]

## 2010-06-13 NOTE — H&P (Signed)
NAME:  Melissa, [name]                 ACCOUNT NO.:  [id number]   MEDICAL RECORD NO.:  [id number]          PATIENT TYPE:  INP   LOCATION:  A204                          FACILITY:  [facility]   PHYSICIAN:  Melissa [name], M.D.        DATE OF BIRTH:  1940-10-25   DATE OF ADMISSION:  02/18/2007  DATE OF DISCHARGE:  LH                              HISTORY & PHYSICAL   PRIMARY CARE PHYSICIAN:  Melissa [name], [place].   PRIMARY NEPHROLOGIST:  Melissa [name].   CHIEF COMPLAINT:  Generalized weakness.   HISTORY OF PRESENT ILLNESS:  Melissa [name] is a 70 year old white female  with from multiple medical problems as outlined below who has been on  dialysis since May 2008 who was doing well up until January 1 when she  noted that she became more weak, just generalized.  She has brought this  attention to her primary doctor as well as to Melissa [name] and has not  been thoroughly worked up before.  She had just seen her heart doctor  back in December, Melissa [name], who stated that she had pretty much stable  aortic stenosis and not had any further issues.  She denies having any  specific chest pain or shortness of breath.  Up until today, she has not  had any other problems that is acute other than this weakness.  She did  have a fall out of her chair today and was just unable to get up, and  for that reason, was brought to the emergency department.  Initially she  was seen and assessed approximately 2 weeks ago at [facility] for a  similar situation but they did a host with medical lab tests on her  which did not reveal anything acutely, and she was sent home.  She did  have dialysis today and tolerated that without any significant problems,  but overall does feel generalized weakness.   PAST MEDICAL HISTORY:  1. A coronary artery disease.  2. Mild to moderate aortic stenosis, last ejection fraction estimated      at 55% in December  3. Hyperlipidemia.  4. Insulin-dependent diabetes.  5. Constipation.  6.  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  7. Chronic renal failure felt secondary to diabetes and hypertension      on dialysis Tuesday, Thursday, Saturday.  8. Hypertension.  9. Anemia secondary to chronic renal failure.  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   SURGERIES:  1. Appendectomy.  2. Removal of ovaries 40 years ago.  3. Left great toe amputation in 1992, question related to diabetes.  4. Retinal surgery in May 21, 2006.  5. Cataract surgery in 2007.   MEDICATIONS:  1. Benicar 40 mg p.o. daily.  2. Cardura 8 mg p.o. daily.  3. Renagel 2400 mg t.i.d. with meals.  4. Furosemide which is Lasix 40 mg p.o. daily.  5. Clonidine 0.2 mg b.i.d.  6. Diltiazem 360 mg p.o. daily.  7. Aspirin 81 mg p.o. daily.  8. Omeprazole 20 mg p.o. daily.  9. Plavix 75 mg p.o. daily.  10.Simvastatin 80 mg p.o. daily.  11.Lantus 80 units subcu q.h.s.  12.Apidra sliding scale insulin daily.  13.Sensipar 30 mg p.o. daily,   ALLERGIES:  PAIN MEDICATIONS, BELIEVE MAKE HER DIZZY.   SOCIAL HISTORY:  Divorced from her husband, lives alone.  No smoking,  drinking or drugs. Three living sons and one daughter.   FAMILY HISTORY:  Father died in his 33's of construction accident.  Mother died at 59 with coronary artery disease.  Mother also had type 2  diabetes. Does not have any siblings.   REVIEW OF SYSTEMS:  The patient denies any significant headache.  Does  have some sinus congestion.  No difficulty with her swallowing.  No  chest pain.  No shortness of breath.  Very rare cough.  Is chronically  constipated.  No diarrhea.  Has not noticed any blood in her stools,  black tarry stools.  EXTREMITIES:  Has not noted any lower extremity  edema.  She denies having any fever, chills up until today as far as  fever is concerned and occasionally has dizziness but is not having any  today.  She denies any dysuria, nausea or vomiting.   PHYSICAL EXAMINATION:  VITAL SIGNS:  Temperature on  admission is 100.1,  did go up to 101.8 while in the emergency department; blood pressure has  been ranging between 157/69-174/55; heart rate is 81-84; respiratory  rate is 18-24; sating 93-96% on room air.  GENERAL:  This is a mildly obese white female, lying in bed, in no acute  distress.  Alert and oriented and talkative.  HEENT:  Pupils are mildly reactive.  Does have cataract replacement.  Throat and oropharynx are dry.  NECK:  Supple.  No thyromegaly.  Do not appreciate any carotid bruits.  No JVD.  LUNGS:  Clear to auscultation bilaterally.  HEART:  Regular rate and rhythm with 3/6 systolic murmur, best  appreciated at the right upper sternal border.  ABDOMEN:  Soft.  No epigastric tenderness.  Positive bowel sounds.  Does  have some lower abdominal pain to palpation, but only to palpation not  that generalized.  No rebound or guarding.  EXTREMITIES:  No lower extremity edema.  She does have very keen, tiny,  ecchymotic area on her right third toe as well as amputation of her  first big toe on her left as well as a small spot on her third toe on  her left foot with a small ecchymotic area on it, but no significant  erythema or warmth to these areas.  NEUROLOGIC:  The patient does have some right-sided weakness which she  states is chronic but generalized significantly worse at this time with  weakness all over.  No specific speech difficulty.  No mentation  problems noted.  Cranial nerves II-XII intact.   LABORATORY DATA:  Lab record obtained showed white count of 5.9,  hemoglobin 11.4, platelet count of 181, neutrophils 85%, absolute  neutrophils of 5.0, B-met:  Sodium 137, potassium 4.5, chloride 101, CO2  31, glucose 73, BUN 21, creatinine 3.44, calcium 8.9.  Influenza A and B  were negative.  UA showed small blood, but otherwise negative nitrite,  negative leukocyte.  Urine micro shows rare bacteria, 3/6 WBCs.  Chest x-  ray states cardiomegaly with pulmonary venous  hypertension, chronic  bronchitic changes.  No definite acute abnormalities noted.   IMPRESSIONS:  This is a 70 year old white female with end-stage renal  disease who had approximately 3 weeks of generalized weakness of unknown  etiology.  She presents now with  a fever of unknown source as well.   ASSESSMENT/PLAN:  1. Weakness.  A very odd situation.  It is not exactly clear what the      cause of this is.  She does have difficulty related to previous      stroke, but it improved significantly after this, but could not      even hold herself up with walker, which is not her baseline.  This      has progressively been getting worse over the last 3 weeks.  She      has not had any specific illness that she can point to, and has      continued to do dialysis Tuesday, Thursday and Saturday.  Will see      if Melissa [name] has any further to offer on this as well, question      whether this is related to a heart etiology, although has had a      normal 2-D echo recently in December.  Do not think it is acutely      worsened, but will check a cardiac panel on her as well as a get      another 2-D echo to see if that has significantly changed or      worsening problems with her heart.  Fever also could cause this      weakness and illness could be causing this generalized weakness as      well.  2. Fever.  She has not had a significant white count, but given her      dialysis and diabetes, will empirically go ahead and start her on      Levaquin as well as vancomycin.  Did not have any specific signs of      infection, but does have some lower abdominal pain and question      whether this is the cause of it or not.  3. Abdominal pain.  Will get a CT with oral contrast to see if      anything is going on with her lower abdomen and pelvis which could      be causing some of this generalized weakness.  4. Hypertension.  Will continue on her antihypertensive medicines as      previous  prescribed.  Question whether some of these could be      causing her to feel bad is possible.  5. Diabetes mellitus type 2.  Will cut her Lantus down to 50 units      subcu q.h.s. and cover with sliding scale insulin q.a.c. and q.h.s.      while in the hospital.   DISPOSITION:  The patient will be admitted to telemetry due to this  weakness and fever.  She will likely need some physical therapy set up.  She does have a history of stroke and if all workup is negative,  question whether need to repeat CT of her head, although, she does not  have any focal neurologic complaints or difficulty that is more  generalized in nature.  Will check routine lab work in the morning and  follow up on any further issues that may arise.      Melissa [name], M.D.  Electronically Signed     ZH/MEDQ  D:  02/18/2007  T:  02/19/2007  Job:  [id number]

## 2010-06-16 NOTE — Op Note (Signed)
NAME:  Melissa [name], Melissa [name]                 ACCOUNT NO.:  [id number]   MEDICAL RECORD NO.:  [id number]          PATIENT TYPE:  AMB   LOCATION:  DFTL                         FACILITY:  [facility]   PHYSICIAN:  John D. Ashley Royalty, M.D. 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   PROCEDURE:  Pars plana vitrectomy with membrane peel, retinal  photocoagulation, gas fluid exchange, right eye.   SURGEON:  Beulah Gandy. Ashley Royalty, M.D.   ASSISTANT:  Bryan Lemma. Lundquist, P.A.-C.   ANESTHESIA:  General.   DETAILS:  Usual prep and drape, peritomies at 8, 10, and 2 o'clock.  The  5 mm infusion port anchored into place at 8 o'clock.  The lighted pick  and the cutter were placed 10 and 2 o'clock respectively.  Contact lens  ring was sutured into place at 6 and 12 o'clock.  Provisc was placed on  the corneal surface and the flat contact lens was placed.  Pars plana  vitrectomy was begun just behind the pseudophakos. The vitrectomy is  carried down to the macular surface where a glistening vitreous membrane  was encountered.  This was engaged with the 20 gauge MVR and the  vitreous cutter.  Forceps and the vitreous pick were used to engage the  membrane also and peel it from its attachments to the disk and the  macula.  The membrane extended out into the far periphery and some areas  of hemorrhage and edematous retina were seen in the upper temporal  quadrant.  The 30 degree prismatic lens was moved into place.  A total  of 1451 burns were placed in the retinal periphery with the endolaser.  The power was set between 700 and 1500 milliwatts, 1000 microns each,  and 0.1 to 0.15 seconds each.  The area of edematous retina was  surrounded and the areas of bleeding were cauterized with the laser.  Because this area was superior, the decision for gas fluid exchange was  made.  Perfluoron propane 5% was injected and exchange for intravitreal  gas.  Sufficient time was allowed for additional fluid to track down the  walls of the eye and collect in the posterior segment.  The [place]  [facility] brush was used to remove this fluid. A total gas fluid  exchange was performed. The instruments were removed from the eye and  the sclerotomy points were closed with 9-0 nylon suture.  They were  tested and found to be tight.  The conjunctiva was closed with wet field  cautery and 7-0 chromic.  Polymyxin and gentamicin were irrigated into  tenon's space.  Marcaine was injected around the globe for postop pain.  Decadron 10 mg was injected into the lower subconjunctival space.  TobraDex ophthalmic ointment, a patch and shield were placed.  Closing  pressure was 10 with a Banker.  Complications none.  Duration one hour.  The patient was awakened and taken to recovery in  satisfactory condition.  Beulah Gandy. Ashley Royalty, M.D.  Electronically Signed     JDM/MEDQ  D:  05/21/2006  T:  05/21/2006  Job:  [id number]

## 2010-06-16 NOTE — Assessment & Plan Note (Signed)
[facility]                            [facility] OFFICE NOTE   NAME:Markuson, DESHANAE LINDO                        MRN:          [id number]  DATE:11/09/2005                            DOB:          [birth year]    PRIMARY CARDIOLOGIST:  Dr. Willa Rough.   REASON FOR OFFICE VISIT:  Annual followup.   Ms. Scripter is a very pleasant 70 year old female with a history of aortic  stenosis initially documented by echocardiography in September 2006.  She  has no known history of coronary artery disease and has normal left  ventricular function.   The patient reports no interim development of any exertional angina  pectoris, exertional dyspnea, or pre-syncope/syncope since last seen.   CURRENT MEDICATIONS:  1. Cozaar 100 q. day.  2. Lasix 20 q. day.  3. Diltiazem 360 q. day.  4. Nephro-Vite.  5. Cardura 4 q. day.  6. Simvastatin 80 nightly.  7. Lantus insulin 65 units nightly.  8. Humulin R insulin sliding scale.  9. Aspirin 81 q. day.  10.Fish oil 1000 q. day.  11.Renagel 1600 mg t.i.d.  12.Poly iron 150 mg b.i.d.   PHYSICAL EXAMINATION:  Blood pressure 144/80, pulse 60, regular.  Weight  204.  NECK:  Palpable bilateral carotid pulses with bilateral carotid bruits (left  greater than right); no JVD.  LUNGS:  Clear to auscultation in all fields.  HEART:  Regular rate and rhythm (S1 and S2), grade 2-3/6 systolic ejection  murmur at the base; no diastolic blow.  EXTREMITIES:  Palpable distal pulses with 1+ bilateral lower extremity  edema.  NEURO:  No focal deficit.   IMPRESSION:  1. Asymptomatic aortic stenosis.      a.     Mild/moderate by echocardiography September 2006.  2. Normal left ventricular function.      a.     Moderate left ventricular hypertrophy.  3. Chronic renal insufficiency.      a.     Followed by Dr. Kristian Covey.  4. Hypertension.  5. Hyperlipidemia.  6. Insulin-requiring diabetes mellitus.  7. Nonobstructive cerebrovascular  disease.      a.     Carotid Dopplers September 2006.  8. Mild lower extremity edema.   PLAN:  Continue current medication regimen.  Schedule a return clinic  followup with Dr. Willa Rough in 1 year.  Consider a repeat echocardiogram  at that time for reassessment of severity of aortic stenosis.      ______________________________  Rozell Searing, PA-C    ______________________________  Luis Abed, MD, [facility]    GS/MedQ  DD:  11/09/2005  DT:  11/11/2005  Job #:  [id number]   cc:   Donzetta Sprung

## 2010-06-16 NOTE — Op Note (Signed)
NAME:  Melissa [name], Melissa [name]                 ACCOUNT NO.:  [id number]   MEDICAL RECORD NO.:  [id number]          PATIENT TYPE:  AMB   LOCATION:  DAY                           FACILITY:  [facility]   PHYSICIAN:  Trish Fountain, MD    DATE OF BIRTH:  08-31-1940   DATE OF PROCEDURE:  03/15/2005  DATE OF DISCHARGE:                                 OPERATIVE REPORT   PREOPERATIVE DIAGNOSIS:  Cataract, right eye.   POSTOPERATIVE DIAGNOSIS:  Cataract, right eye.   SURGERY:  Kelman phacoemulsification, right eye, with posterior chamber  intraocular lens, right eye.   ANESTHESIA:  MAC with topical anesthesia of the right eye.   SURGEON:  Trish Fountain, MD   SPECIMENS:  None.   COMPLICATIONS:  None.   HISTORY:  This is a 70 year old female who has slowly progressive decrease  in vision in the right eye.   LENS MODEL:  AMO [id number], 26.0 diopter lens, serial #[id number].   DESCRIPTION OF PROCEDURE:  In the preoperative area, the patient had  Cyclogyl and Neo-Synephrine drops in the right eye in order to dilate the  eye along with Tetracaine to help anesthetize the eye.  Once the patient's  right eye was dilated, the patient was taken to the operating room and  prepped.  The right eye was prepped and draped in the usual sterile manner.  A lid speculum was placed in the right eye, and 2% Xylocaine jelly was  placed in the right eye as well.  A paracentesis was made through clear  cornea at the limbus at approximately the 11 o'clock position of the right  eye.  Nonpreserved Xylocaine 1% 1 cc was placed into the anterior chamber  for one minute.  Viscoat was then used to fill the anterior chamber.  Using  a 2.75 mm blade at the 9 o'clock position, an incision into the anterior  chamber was made through clear cornea near the limbus.  Viscoat was again  used to reform the anterior chamber.  A 25 gauge bent capsulotomy needle was  used to begin the capsulorrhexis through the anterior capsule of the  lens.  Utrata forceps were used to make a 360 degree anterior capsulorrhexis.  A  Chang 27 gauge irrigating cannula was used to hydrodissect and  hydrodelineate the nucleus.  Once hydrodissection and hydrodelineation was  carried out, [facility] phacoemulsification was used to make a deep groove in  the lens nucleus.  The lens was rotated 360 degrees and divided into four  quadrants using deep grooves made by phacoemulsification with the [facility]  phacoemulsification tip.  The nucleus was then divided using the phaco tip  and the nucleus manipulator.  The nuclear quadrants were then removed using  phacoemulsification.  The irrigation aspiration was then used to remove the  remainder of the cortex.  The anterior chamber and posterior capsule was  filled with Provisc, and the 9 o'clock position incision was slightly  widened, using the same 2.75 mm blade that was initially used to make the  incision.  An intraocular lens was placed in the shooter, and  this was  placed in the eye, followed by placement of the trailing haptic into the  posterior capsule, using the Kugelan.  Irrigation/aspiration was then used  to remove Provisc from the anterior chamber and the posterior capsule.  BSS  on a syringe was then used to hydrate the cornea at the 9 o'clock incision  site.  The incision site was then checked for water tightness, using a Weck-  cel.  Half-strength Betadine solution was placed, 1 drop, in the inner  canthus, and 1 drop in the outer canthus.  After one minute, this was rinsed  from the eye.  Drops were placed in the eye, Vigamox, followed by Nevanac  followed by Econopred.  A shield was placed over the patient's right eye,  and the patient was sent to the recovery room in satisfactory condition.      Trish Fountain, MD  Electronically Signed     PVK/MEDQ  D:  03/16/2005  T:  03/16/2005  Job:  [id number]

## 2010-07-11 ENCOUNTER — Encounter: Payer: Self-pay | Admitting: *Deleted

## 2010-07-14 ENCOUNTER — Encounter: Payer: Self-pay | Admitting: Cardiology

## 2010-07-14 DIAGNOSIS — I350 Nonrheumatic aortic (valve) stenosis: Secondary | ICD-10-CM | POA: Insufficient documentation

## 2010-07-14 DIAGNOSIS — I779 Disorder of arteries and arterioles, unspecified: Secondary | ICD-10-CM | POA: Insufficient documentation

## 2010-07-14 DIAGNOSIS — E785 Hyperlipidemia, unspecified: Secondary | ICD-10-CM | POA: Insufficient documentation

## 2010-07-14 DIAGNOSIS — I639 Cerebral infarction, unspecified: Secondary | ICD-10-CM | POA: Insufficient documentation

## 2010-07-14 DIAGNOSIS — E214 Other specified disorders of parathyroid gland: Secondary | ICD-10-CM | POA: Insufficient documentation

## 2010-07-14 DIAGNOSIS — I2510 Atherosclerotic heart disease of native coronary artery without angina pectoris: Secondary | ICD-10-CM | POA: Insufficient documentation

## 2010-07-14 DIAGNOSIS — I739 Peripheral vascular disease, unspecified: Secondary | ICD-10-CM

## 2010-07-14 DIAGNOSIS — I10 Essential (primary) hypertension: Secondary | ICD-10-CM | POA: Insufficient documentation

## 2010-07-14 DIAGNOSIS — N189 Chronic kidney disease, unspecified: Secondary | ICD-10-CM | POA: Insufficient documentation

## 2010-07-14 DIAGNOSIS — R609 Edema, unspecified: Secondary | ICD-10-CM | POA: Insufficient documentation

## 2010-07-17 ENCOUNTER — Encounter: Payer: Self-pay | Admitting: Cardiology

## 2010-07-17 ENCOUNTER — Ambulatory Visit (INDEPENDENT_AMBULATORY_CARE_PROVIDER_SITE_OTHER): Payer: Medicare Other | Admitting: Cardiology

## 2010-07-17 DIAGNOSIS — I2510 Atherosclerotic heart disease of native coronary artery without angina pectoris: Secondary | ICD-10-CM

## 2010-07-17 DIAGNOSIS — I10 Essential (primary) hypertension: Secondary | ICD-10-CM

## 2010-07-17 DIAGNOSIS — I350 Nonrheumatic aortic (valve) stenosis: Secondary | ICD-10-CM

## 2010-07-17 DIAGNOSIS — I359 Nonrheumatic aortic valve disorder, unspecified: Secondary | ICD-10-CM

## 2010-07-17 DIAGNOSIS — I779 Disorder of arteries and arterioles, unspecified: Secondary | ICD-10-CM

## 2010-07-17 NOTE — Patient Instructions (Signed)
Your physician wants you to follow-up in: 6 months. You will receive a reminder letter in the mail one-two months in advance. If you don't receive a letter, please call our office to schedule the follow-up appointment. Your physician recommends that you continue on your current medications as directed. Please refer to the Current Medication list given to you today. 

## 2010-07-17 NOTE — Progress Notes (Signed)
HPI The patient is seen for cardiology followup.  I saw her last April 03, 2010.  She had a non-STEMI in January, 2012.  This was related to another acute illness.  Decision was made to follow her medically.  When I saw her last she had carotid bruits and decision was made to proceed with a carotid Doppler.  This was done May 08, 2010.  She had less than 50% stenoses bilaterally.  This is stable.  She has had some problems with low blood pressure at dialysis.  Her pressure today is elevated.  We will not be of push her medicines any further. Allergies  Allergen Reactions  . Other     NARCOTICS    Current Outpatient Prescriptions  Medication Sig Dispense Refill  . amLODipine (NORVASC) 5 MG tablet Take 5 mg by mouth daily.        Marland Kitchen aspirin 81 MG tablet Take 81 mg by mouth daily.        . carvedilol (COREG) 12.5 MG tablet TAKE 1 TABLET TWICE DAILY  60 tablet  6  . cinacalcet (SENSIPAR) 30 MG tablet Take 30 mg by mouth daily.        . cloNIDine (CATAPRES - DOSED IN MG/24 HR) 0.3 mg/24hr Place 1 patch onto the skin once a week.        . clopidogrel (PLAVIX) 75 MG tablet Take 75 mg by mouth daily.        Marland Kitchen doxazosin (CARDURA XL) 8 MG 24 hr tablet Take 8 mg by mouth daily with breakfast.        . folic acid-vitamin b complex-vitamin c-selenium-zinc (DIALYVITE) 3 MG TABS Take 1 tablet by mouth daily.        . furosemide (LASIX) 40 MG tablet Take 1 tablet (40 mg total) by mouth daily.  30 tablet  11  . hydrALAZINE (APRESOLINE) 50 MG tablet Take 50 mg by mouth 2 (two) times daily.        . insulin glargine (LANTUS) 100 UNIT/ML injection Inject into the skin at bedtime.        . insulin glulisine (APIDRA) 100 UNIT/ML injection Inject into the skin 3 (three) times daily before meals.        Marland Kitchen olmesartan (BENICAR) 40 MG tablet Take 40 mg by mouth daily.        Marland Kitchen omeprazole (PRILOSEC) 20 MG capsule Take 20 mg by mouth daily.        . sevelamer (RENAGEL) 800 MG tablet Take 3,200 mg by mouth 3 (three)  times daily with meals. 1600mg  with each snack      . simvastatin (ZOCOR) 40 MG tablet Take 40 mg by mouth at bedtime.        . Zinc 50 MG TABS Take 1 tablet by mouth daily.        Marland Kitchen DISCONTD: LASIX 40 MG tablet TAKE (1) TABLET BY MOUTH ONCE DAILY.  30 each  11    History   Social History  . Marital Status: Divorced    Spouse Name: N/A    Number of Children: N/A  . Years of Education: N/A   Occupational History  . Not on file.   Social History Main Topics  . Smoking status: Never Smoker   . Smokeless tobacco: Never Used  . Alcohol Use: No  . Drug Use: No  . Sexually Active: Not on file   Other Topics Concern  . Not on file   Social History Narrative   Lives  in [place] alone. Divorced and has four children.Has home health aide 5 days/week    Family History  Problem Relation Age of Onset  . Coronary artery disease Mother     died age 58  . Diabetes type II Mother   . Other Father     died in Jun 25, 2022 of accident  . Diabetes type II Daughter   . Hiatal hernia Son     Past Medical History  Diagnosis Date  . Coronary artery disease     catheterization March 2011.(presentation wtih mild CHF and mild increased troponin))..70% proximal/80% mid LAD,50% circumflex.Marland KitchenLAD could be approached but with difficulty....medicall therapy recommended/NSTEMI 01/31/2010 with sepsis and anemia...EF 50%...medical Rx  . Diabetes mellitus   . Hyperlipidemia   . Aortic stenosis     mild to moderate...echo.Marland KitchenMarland KitchenSeptember,2006/catheterization valve area 1.9cm square/mod/severe....echo...10/2009/mod/severe....echo....01/31/2010  . 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 Hypertension   . Anemia   . CVA (cerebral vascular accident)   . Chronic kidney disease     ESRD...dialysis  . Carotid artery disease     doppler Jun 24, 2009 /  doppler 05/08/2010 similar, <50% bilateral with some plaque  . Edema     Past Surgical History  Procedure Date  . Appendectomy   .  Oophorectomy   . Toe amputation     left great toe  . Retinal detachment surgery   . Cataract extraction     ROS  Patient denies fever, chills, headache, sweats, rash, change in vision, change in hearing, chest pain, cough, nausea vomiting, urinary symptoms.  All other systems are reviewed and are negative.  PHYSICAL EXAM Patient is stable today.  Head is atraumatic.  Lungs are clear.  Respiratory effort is nonlabored.  Cardiac exam reveals S1 and S2.  There is a 3/6% of the crescendo murmur of aortic stenosis.  The abdomen is soft.  There is no peripheral edema. Filed Vitals:   07/17/10 0941  BP: 168/64  Pulse: 56  Height: 5\' 4"  (1.626 m)  Weight: 194 lb (87.998 kg)    EKG He is not done today.  ASSESSMENT & PLAN

## 2010-07-17 NOTE — Assessment & Plan Note (Signed)
She has swings in her blood pressure related to the timing of her dialysis.  I will not change her medicines for it.

## 2010-07-17 NOTE — Assessment & Plan Note (Signed)
Doppler reveals stable carotid disease.  No significant further workup at this time.

## 2010-07-17 NOTE — Assessment & Plan Note (Signed)
Aortic stenosis is significant.  We know that it was moderately severe by echo in January, 2012.  I will make a decision about the timing of her next echo when I see her in followup.

## 2010-07-17 NOTE — Assessment & Plan Note (Signed)
Coronary disease is stable.  I will continue to follow her medically.  I will see her back in 6 months.

## 2010-10-19 LAB — CBC
HCT: 33.9 — ABNORMAL LOW
HCT: 34.5 — ABNORMAL LOW
HCT: 37.1
Hemoglobin: 11.6 — ABNORMAL LOW
Hemoglobin: 12.4
MCHC: 33.2
MCHC: 33.3
MCHC: 33.6
MCHC: 33.6
MCHC: 33.7
MCV: 88.8
MCV: 89.1
MCV: 89.2
MCV: 89.3
MCV: 89.8
Platelets: 156
Platelets: 160
Platelets: 202
RBC: 3.72 — ABNORMAL LOW
RBC: 3.82 — ABNORMAL LOW
RBC: 4.18
RDW: 15.3
RDW: 15.3
RDW: 15.5
RDW: 15.6 — ABNORMAL HIGH
RDW: 15.6 — ABNORMAL HIGH
WBC: 4.2
WBC: 5.6
WBC: 5.6

## 2010-10-19 LAB — CULTURE, BLOOD (ROUTINE X 2)
Culture: NO GROWTH
Culture: NO GROWTH
Report Status: 1262009
Report Status: 1262009

## 2010-10-19 LAB — URINE CULTURE

## 2010-10-19 LAB — BASIC METABOLIC PANEL
BUN: 19
BUN: 21
BUN: 29 — ABNORMAL HIGH
BUN: 34 — ABNORMAL HIGH
BUN: 43 — ABNORMAL HIGH
CO2: 28
CO2: 28
CO2: 28
CO2: 31
CO2: 32
Calcium: 8.9
Calcium: 8.9
Calcium: 9
Chloride: 101
Chloride: 102
Chloride: 102
Chloride: 103
Chloride: 99
Creatinine, Ser: 3.2 — ABNORMAL HIGH
Creatinine, Ser: 3.44 — ABNORMAL HIGH
Creatinine, Ser: 4.64 — ABNORMAL HIGH
Creatinine, Ser: 5.02 — ABNORMAL HIGH
GFR calc Af Amer: 10 — ABNORMAL LOW
GFR calc Af Amer: 11 — ABNORMAL LOW
GFR calc Af Amer: 17 — ABNORMAL LOW
GFR calc non Af Amer: 8 — ABNORMAL LOW
Glucose, Bld: 106 — ABNORMAL HIGH
Glucose, Bld: 133 — ABNORMAL HIGH
Glucose, Bld: 42 — ABNORMAL LOW
Glucose, Bld: 73
Glucose, Bld: 87
Potassium: 3.7
Potassium: 3.8
Potassium: 4.2
Sodium: 138

## 2010-10-19 LAB — DIFFERENTIAL
Basophils Absolute: 0
Basophils Absolute: 0
Basophils Absolute: 0
Basophils Relative: 0
Basophils Relative: 0
Basophils Relative: 0
Basophils Relative: 0
Eosinophils Absolute: 0.1
Eosinophils Absolute: 0.1
Eosinophils Absolute: 0.2
Eosinophils Absolute: 0.4
Eosinophils Relative: 1
Eosinophils Relative: 3
Eosinophils Relative: 4
Lymphocytes Relative: 14
Lymphocytes Relative: 18
Lymphocytes Relative: 23
Lymphs Abs: 0.8
Lymphs Abs: 0.8
Lymphs Abs: 1.3
Monocytes Absolute: 0.4
Monocytes Absolute: 0.5
Monocytes Absolute: 0.5
Monocytes Absolute: 0.6
Monocytes Relative: 10
Monocytes Relative: 10
Monocytes Relative: 14 — ABNORMAL HIGH
Monocytes Relative: 9
Neutro Abs: 3.5
Neutro Abs: 4
Neutro Abs: 4.1
Neutro Abs: 4.2
Neutro Abs: 5
Neutrophils Relative %: 62
Neutrophils Relative %: 67
Neutrophils Relative %: 67
Neutrophils Relative %: 85 — ABNORMAL HIGH

## 2010-10-19 LAB — INFLUENZA A+B VIRUS AG-DIRECT(RAPID): Influenza B Ag: NEGATIVE

## 2010-10-19 LAB — COMPREHENSIVE METABOLIC PANEL
AST: 31
Albumin: 3.4 — ABNORMAL LOW
BUN: 27 — ABNORMAL HIGH
Calcium: 9.2
Creatinine, Ser: 4.12 — ABNORMAL HIGH
GFR calc Af Amer: 13 — ABNORMAL LOW
Total Protein: 6

## 2010-10-19 LAB — URINALYSIS, ROUTINE W REFLEX MICROSCOPIC
Bilirubin Urine: NEGATIVE
Nitrite: NEGATIVE
Protein, ur: 100 — AB
Specific Gravity, Urine: 1.015
Urobilinogen, UA: 0.2

## 2010-10-19 LAB — URINE MICROSCOPIC-ADD ON

## 2010-10-19 LAB — VANCOMYCIN, RANDOM: Vancomycin Rm: 16.3

## 2010-10-19 LAB — CARDIAC PANEL(CRET KIN+CKTOT+MB+TROPI)
CK, MB: 1.7
Total CK: 972 — ABNORMAL HIGH
Troponin I: 0.14 — ABNORMAL HIGH

## 2010-10-23 ENCOUNTER — Other Ambulatory Visit: Payer: Self-pay | Admitting: Cardiology

## 2010-11-13 LAB — POCT I-STAT 4, (NA,K, GLUC, HGB,HCT)
Hemoglobin: 13.6
Sodium: 136

## 2010-12-08 ENCOUNTER — Other Ambulatory Visit (HOSPITAL_COMMUNITY): Payer: Self-pay | Admitting: Nephrology

## 2010-12-08 DIAGNOSIS — Z992 Dependence on renal dialysis: Secondary | ICD-10-CM

## 2010-12-18 ENCOUNTER — Other Ambulatory Visit (HOSPITAL_COMMUNITY): Payer: Medicare Other

## 2010-12-27 ENCOUNTER — Other Ambulatory Visit (HOSPITAL_COMMUNITY): Payer: Self-pay | Admitting: Nephrology

## 2010-12-27 ENCOUNTER — Ambulatory Visit (HOSPITAL_COMMUNITY)
Admission: RE | Admit: 2010-12-27 | Discharge: 2010-12-27 | Disposition: A | Discharge status: Home / Self Care | Payer: Medicare Other | Source: Ambulatory Visit | Attending: Nephrology | Admitting: Nephrology

## 2010-12-27 DIAGNOSIS — N186 End stage renal disease: Secondary | ICD-10-CM | POA: Insufficient documentation

## 2010-12-27 DIAGNOSIS — E119 Type 2 diabetes mellitus without complications: Secondary | ICD-10-CM | POA: Insufficient documentation

## 2010-12-27 DIAGNOSIS — I359 Nonrheumatic aortic valve disorder, unspecified: Secondary | ICD-10-CM | POA: Insufficient documentation

## 2010-12-27 DIAGNOSIS — Z992 Dependence on renal dialysis: Secondary | ICD-10-CM

## 2010-12-27 DIAGNOSIS — T82898A Other specified complication of vascular prosthetic devices, implants and grafts, initial encounter: Secondary | ICD-10-CM | POA: Insufficient documentation

## 2010-12-27 DIAGNOSIS — I120 Hypertensive chronic kidney disease with stage 5 chronic kidney disease or end stage renal disease: Secondary | ICD-10-CM | POA: Insufficient documentation

## 2010-12-27 DIAGNOSIS — Y849 Medical procedure, unspecified as the cause of abnormal reaction of the patient, or of later complication, without mention of misadventure at the time of the procedure: Secondary | ICD-10-CM | POA: Insufficient documentation

## 2010-12-27 DIAGNOSIS — E785 Hyperlipidemia, unspecified: Secondary | ICD-10-CM | POA: Insufficient documentation

## 2010-12-27 MED ORDER — IOHEXOL 300 MG/ML  SOLN
100.0000 mL | Freq: Once | INTRAMUSCULAR | Status: AC | PRN
  Administered 2010-12-27: 50 mL via INTRAVENOUS

## 2010-12-27 MED ORDER — MIDAZOLAM HCL 2 MG/2ML IJ SOLN
INTRAMUSCULAR | Status: AC
  Filled 2010-12-27: qty 4

## 2010-12-27 MED ORDER — FENTANYL CITRATE 0.05 MG/ML IJ SOLN
INTRAMUSCULAR | Status: AC
  Filled 2010-12-27: qty 4

## 2010-12-27 NOTE — Procedures (Signed)
Shuntogram 7mm PTA of intragraft and venous anast stenoses No complication No blood loss. See complete dictation in [facility].

## 2010-12-27 NOTE — H&P (Signed)
Melissa [name] is an 70 y.o. female.   Chief Complaint: decreasing flow rates in LFAG at HD HPI: As above. Has had prior declot.  Past Medical History  Diagnosis Date  . Coronary artery disease     catheterization March 2011.(presentation wtih mild CHF and mild increased troponin))..70% proximal/80% mid LAD,50% circumflex.Marland KitchenLAD could be approached but with difficulty....medicall therapy recommended/NSTEMI 01/31/2010 with sepsis and anemia...EF 50%...medical Rx  . Diabetes mellitus   . Hyperlipidemia   . Aortic stenosis     mild to moderate...echo.Marland KitchenMarland KitchenSeptember,2006/catheterization valve area 1.9cm square/mod/severe....echo...10/2009/mod/severe....echo....01/31/2010  . 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 Hypertension   . Anemia   . CVA (cerebral vascular accident)   . Chronic kidney disease     ESRD...dialysis  . Carotid artery disease     doppler 2009-06-12 /  doppler 05/08/2010 similar, <50% bilateral with some plaque  . Edema     Past Surgical History  Procedure Date  . Appendectomy   . Oophorectomy   . Toe amputation     left great toe  . Retinal detachment surgery   . Cataract extraction     Family History  Problem Relation Age of Onset  . Coronary artery disease Mother     died age 15  . Diabetes type II Mother   . Other Father     died in 06-13-22 of accident  . Diabetes type II Daughter   . Hiatal hernia Son    Social History:  reports that she has never smoked. She has never used smokeless tobacco. She reports that she does not drink alcohol or use illicit drugs.  Allergies:  Allergies  Allergen Reactions  . Other     NARCOTICS    Medications Prior to Admission  Medication Sig Dispense Refill  . aspirin 81 MG tablet Take 81 mg by mouth daily.        . carvedilol (COREG) 12.5 MG tablet TAKE 1 TABLET TWICE DAILY  60 tablet  6  . cinacalcet (SENSIPAR) 30 MG tablet Take 30 mg by mouth daily.        . cloNIDine (CATAPRES - DOSED  IN MG/24 HR) 0.3 mg/24hr Place 1 patch onto the skin once a week.        . clopidogrel (PLAVIX) 75 MG tablet Take 75 mg by mouth daily.        Marland Kitchen doxazosin (CARDURA XL) 8 MG 24 hr tablet Take 8 mg by mouth daily with breakfast.        . folic acid-vitamin b complex-vitamin c-selenium-zinc (DIALYVITE) 3 MG TABS Take 1 tablet by mouth daily.        . furosemide (LASIX) 40 MG tablet Take 1 tablet (40 mg total) by mouth daily.  30 tablet  11  . hydrALAZINE (APRESOLINE) 50 MG tablet Take 50 mg by mouth 2 (two) times daily.        . insulin glargine (LANTUS) 100 UNIT/ML injection Inject into the skin at bedtime.        . insulin glulisine (APIDRA) 100 UNIT/ML injection Inject into the skin 3 (three) times daily before meals.        . NORVASC 5 MG tablet TAKE 1 TABLET ONCE DAILY.  30 each  6  . olmesartan (BENICAR) 40 MG tablet Take 40 mg by mouth daily.        Marland Kitchen omeprazole (PRILOSEC) 20 MG capsule Take 20 mg by mouth daily.        Marland Kitchen  sevelamer (RENAGEL) 800 MG tablet Take 3,200 mg by mouth 3 (three) times daily with meals. 1600mg  with each snack      . simvastatin (ZOCOR) 40 MG tablet Take 40 mg by mouth at bedtime.        . Zinc 50 MG TABS Take 1 tablet by mouth daily.         No current facility-administered medications on file as of 12/27/2010.    No results found for this or any previous visit (from the past 48 hour(s)). No results found.  ROS  There were no vitals taken for this visit. Physical Exam  Constitutional: She appears well-developed.  HENT:  Mouth/Throat: Oropharynx is clear and moist.  Cardiovascular: Normal rate.   Murmur heard. Respiratory: Breath sounds normal.     Assessment/Plan Shuntogram, possible PTA of significant stenoses.   Nema Oatley III,DAYNE Keiasha Diep 12/27/2010, 8:27 AM

## 2011-01-05 ENCOUNTER — Encounter (INDEPENDENT_AMBULATORY_CARE_PROVIDER_SITE_OTHER): Payer: Medicare Other | Admitting: Ophthalmology

## 2011-01-05 DIAGNOSIS — I10 Essential (primary) hypertension: Secondary | ICD-10-CM

## 2011-01-05 DIAGNOSIS — E1139 Type 2 diabetes mellitus with other diabetic ophthalmic complication: Secondary | ICD-10-CM

## 2011-01-05 DIAGNOSIS — E11319 Type 2 diabetes mellitus with unspecified diabetic retinopathy without macular edema: Secondary | ICD-10-CM

## 2011-01-05 DIAGNOSIS — H35379 Puckering of macula, unspecified eye: Secondary | ICD-10-CM

## 2011-01-17 ENCOUNTER — Encounter (INDEPENDENT_AMBULATORY_CARE_PROVIDER_SITE_OTHER): Payer: Medicare Other | Admitting: Ophthalmology

## 2011-01-17 DIAGNOSIS — H35379 Puckering of macula, unspecified eye: Secondary | ICD-10-CM

## 2011-01-17 DIAGNOSIS — H2700 Aphakia, unspecified eye: Secondary | ICD-10-CM

## 2011-01-22 ENCOUNTER — Ambulatory Visit: Payer: Medicare Other | Admitting: Cardiology

## 2011-02-19 ENCOUNTER — Other Ambulatory Visit: Payer: Self-pay | Admitting: *Deleted

## 2011-02-19 MED ORDER — CARVEDILOL 12.5 MG PO TABS: 12.5000 mg | ORAL_TABLET | Freq: Two times a day (BID) | ORAL | Status: DC

## 2011-02-26 DIAGNOSIS — R748 Abnormal levels of other serum enzymes: Secondary | ICD-10-CM

## 2011-02-27 DIAGNOSIS — I359 Nonrheumatic aortic valve disorder, unspecified: Secondary | ICD-10-CM

## 2011-02-28 ENCOUNTER — Encounter: Payer: Medicare Other | Admitting: Cardiology

## 2011-02-28 DIAGNOSIS — I5023 Acute on chronic systolic (congestive) heart failure: Secondary | ICD-10-CM

## 2011-03-01 DIAGNOSIS — I214 Non-ST elevation (NSTEMI) myocardial infarction: Secondary | ICD-10-CM

## 2011-03-01 DIAGNOSIS — I359 Nonrheumatic aortic valve disorder, unspecified: Secondary | ICD-10-CM

## 2011-03-01 NOTE — Progress Notes (Signed)
This encounter was created in error - please disregard.

## 2011-03-02 ENCOUNTER — Encounter (HOSPITAL_COMMUNITY)
Admission: AD | Disposition: A | Discharge status: Home / Self Care | Payer: Self-pay | Source: Other Acute Inpatient Hospital | Attending: Cardiology

## 2011-03-02 ENCOUNTER — Ambulatory Visit (HOSPITAL_COMMUNITY): Admit: 2011-03-02 | Payer: Self-pay | Admitting: Cardiovascular Disease

## 2011-03-02 ENCOUNTER — Inpatient Hospital Stay (HOSPITAL_COMMUNITY)
Admission: AD | Admit: 2011-03-02 | Discharge: 2011-03-07 | DRG: 246 | Disposition: A | Discharge status: Home / Self Care | Payer: Medicare Other | Source: Other Acute Inpatient Hospital | Attending: Cardiology | Admitting: Cardiology

## 2011-03-02 DIAGNOSIS — Z79899 Other long term (current) drug therapy: Secondary | ICD-10-CM

## 2011-03-02 DIAGNOSIS — I2510 Atherosclerotic heart disease of native coronary artery without angina pectoris: Secondary | ICD-10-CM

## 2011-03-02 DIAGNOSIS — Z7902 Long term (current) use of antithrombotics/antiplatelets: Secondary | ICD-10-CM

## 2011-03-02 DIAGNOSIS — I252 Old myocardial infarction: Secondary | ICD-10-CM

## 2011-03-02 DIAGNOSIS — S98119A Complete traumatic amputation of unspecified great toe, initial encounter: Secondary | ICD-10-CM

## 2011-03-02 DIAGNOSIS — E785 Hyperlipidemia, unspecified: Secondary | ICD-10-CM | POA: Diagnosis present

## 2011-03-02 DIAGNOSIS — I2582 Chronic total occlusion of coronary artery: Secondary | ICD-10-CM | POA: Diagnosis present

## 2011-03-02 DIAGNOSIS — D696 Thrombocytopenia, unspecified: Secondary | ICD-10-CM | POA: Diagnosis present

## 2011-03-02 DIAGNOSIS — N2581 Secondary hyperparathyroidism of renal origin: Secondary | ICD-10-CM | POA: Diagnosis present

## 2011-03-02 DIAGNOSIS — Z794 Long term (current) use of insulin: Secondary | ICD-10-CM

## 2011-03-02 DIAGNOSIS — I214 Non-ST elevation (NSTEMI) myocardial infarction: Principal | ICD-10-CM

## 2011-03-02 DIAGNOSIS — N186 End stage renal disease: Secondary | ICD-10-CM | POA: Diagnosis present

## 2011-03-02 DIAGNOSIS — I509 Heart failure, unspecified: Secondary | ICD-10-CM | POA: Diagnosis present

## 2011-03-02 DIAGNOSIS — I6529 Occlusion and stenosis of unspecified carotid artery: Secondary | ICD-10-CM | POA: Diagnosis present

## 2011-03-02 DIAGNOSIS — I359 Nonrheumatic aortic valve disorder, unspecified: Secondary | ICD-10-CM | POA: Diagnosis present

## 2011-03-02 DIAGNOSIS — I10 Essential (primary) hypertension: Secondary | ICD-10-CM | POA: Diagnosis present

## 2011-03-02 DIAGNOSIS — I5043 Acute on chronic combined systolic (congestive) and diastolic (congestive) heart failure: Secondary | ICD-10-CM | POA: Diagnosis present

## 2011-03-02 DIAGNOSIS — I120 Hypertensive chronic kidney disease with stage 5 chronic kidney disease or end stage renal disease: Secondary | ICD-10-CM | POA: Diagnosis present

## 2011-03-02 DIAGNOSIS — Z8249 Family history of ischemic heart disease and other diseases of the circulatory system: Secondary | ICD-10-CM

## 2011-03-02 DIAGNOSIS — N189 Chronic kidney disease, unspecified: Secondary | ICD-10-CM | POA: Diagnosis present

## 2011-03-02 DIAGNOSIS — I2589 Other forms of chronic ischemic heart disease: Secondary | ICD-10-CM | POA: Diagnosis present

## 2011-03-02 DIAGNOSIS — Z833 Family history of diabetes mellitus: Secondary | ICD-10-CM

## 2011-03-02 DIAGNOSIS — I498 Other specified cardiac arrhythmias: Secondary | ICD-10-CM | POA: Diagnosis present

## 2011-03-02 DIAGNOSIS — I350 Nonrheumatic aortic (valve) stenosis: Secondary | ICD-10-CM

## 2011-03-02 DIAGNOSIS — I440 Atrioventricular block, first degree: Secondary | ICD-10-CM | POA: Diagnosis present

## 2011-03-02 DIAGNOSIS — Z992 Dependence on renal dialysis: Secondary | ICD-10-CM

## 2011-03-02 DIAGNOSIS — D631 Anemia in chronic kidney disease: Secondary | ICD-10-CM | POA: Diagnosis present

## 2011-03-02 DIAGNOSIS — E119 Type 2 diabetes mellitus without complications: Secondary | ICD-10-CM | POA: Diagnosis present

## 2011-03-02 DIAGNOSIS — Z8673 Personal history of transient ischemic attack (TIA), and cerebral infarction without residual deficits: Secondary | ICD-10-CM

## 2011-03-02 DIAGNOSIS — Z7982 Long term (current) use of aspirin: Secondary | ICD-10-CM

## 2011-03-02 HISTORY — PX: LEFT HEART CATHETERIZATION WITH CORONARY ANGIOGRAM: SHX5451

## 2011-03-02 LAB — POCT I-STAT 3, VENOUS BLOOD GAS (G3P V)
Bicarbonate: 28.7 mEq/L — ABNORMAL HIGH (ref 20.0–24.0)
O2 Saturation: 70 %
pCO2, Ven: 44 mmHg — ABNORMAL LOW (ref 45.0–50.0)
pH, Ven: 7.435 — ABNORMAL HIGH (ref 7.250–7.300)
pH, Ven: 7.444 — ABNORMAL HIGH (ref 7.250–7.300)

## 2011-03-02 SURGERY — LEFT HEART CATHETERIZATION WITH CORONARY ANGIOGRAM
Anesthesia: LOCAL

## 2011-03-02 MED ORDER — ASPIRIN EC 81 MG PO TBEC
81.0000 mg | DELAYED_RELEASE_TABLET | Freq: Every day | ORAL | Status: DC
  Filled 2011-03-02: qty 1

## 2011-03-02 MED ORDER — DOXAZOSIN MESYLATE 8 MG PO TABS
8.0000 mg | ORAL_TABLET | Freq: Every day | ORAL | Status: DC
  Administered 2011-03-02 – 2011-03-06 (×5): 8 mg via ORAL
  Filled 2011-03-02 (×7): qty 1

## 2011-03-02 MED ORDER — HEPARIN (PORCINE) IN NACL 2-0.9 UNIT/ML-% IJ SOLN
INTRAMUSCULAR | Status: AC
  Filled 2011-03-02: qty 2000

## 2011-03-02 MED ORDER — NITROGLYCERIN 0.4 MG SL SUBL: 0.4000 mg | SUBLINGUAL_TABLET | SUBLINGUAL | Status: DC | PRN

## 2011-03-02 MED ORDER — SODIUM CHLORIDE 0.9 % IJ SOLN: 3.0000 mL | INTRAMUSCULAR | Status: DC | PRN

## 2011-03-02 MED ORDER — ONDANSETRON HCL 4 MG/2ML IJ SOLN: 4.0000 mg | Freq: Four times a day (QID) | INTRAMUSCULAR | Status: DC | PRN

## 2011-03-02 MED ORDER — HEPARIN SOD (PORCINE) IN D5W 100 UNIT/ML IV SOLN
900.0000 [IU]/h | INTRAVENOUS | Status: DC
  Administered 2011-03-02: 900 [IU]/h via INTRAVENOUS
  Filled 2011-03-02 (×2): qty 250

## 2011-03-02 MED ORDER — ZOLPIDEM TARTRATE 5 MG PO TABS
5.0000 mg | ORAL_TABLET | Freq: Every evening | ORAL | Status: DC | PRN
  Administered 2011-03-02 – 2011-03-06 (×4): 5 mg via ORAL
  Filled 2011-03-02 (×4): qty 1

## 2011-03-02 MED ORDER — ASPIRIN EC 325 MG PO TBEC
325.0000 mg | DELAYED_RELEASE_TABLET | Freq: Every day | ORAL | Status: DC
  Administered 2011-03-03 – 2011-03-06 (×3): 325 mg via ORAL
  Filled 2011-03-02 (×4): qty 1

## 2011-03-02 MED ORDER — SEVELAMER CARBONATE 800 MG PO TABS
1600.0000 mg | ORAL_TABLET | Freq: Two times a day (BID) | ORAL | Status: DC | PRN
  Administered 2011-03-02: 1600 mg via ORAL
  Filled 2011-03-02: qty 2

## 2011-03-02 MED ORDER — SODIUM CHLORIDE 0.9 % IV SOLN: 250.0000 mL | INTRAVENOUS | Status: DC | PRN

## 2011-03-02 MED ORDER — INSULIN ASPART 100 UNIT/ML ~~LOC~~ SOLN: 0.0000 [IU] | Freq: Every day | SUBCUTANEOUS | Status: DC

## 2011-03-02 MED ORDER — PANTOPRAZOLE SODIUM 40 MG PO TBEC
40.0000 mg | DELAYED_RELEASE_TABLET | Freq: Every day | ORAL | Status: DC
  Administered 2011-03-02 – 2011-03-07 (×6): 40 mg via ORAL
  Filled 2011-03-02 (×6): qty 1

## 2011-03-02 MED ORDER — AMLODIPINE BESYLATE 5 MG PO TABS
5.0000 mg | ORAL_TABLET | Freq: Every day | ORAL | Status: DC
  Administered 2011-03-02 – 2011-03-07 (×6): 5 mg via ORAL
  Filled 2011-03-02 (×6): qty 1

## 2011-03-02 MED ORDER — INSULIN ASPART 100 UNIT/ML ~~LOC~~ SOLN
0.0000 [IU] | Freq: Three times a day (TID) | SUBCUTANEOUS | Status: DC
  Administered 2011-03-04 – 2011-03-06 (×2): 2 [IU] via SUBCUTANEOUS
  Filled 2011-03-02: qty 3

## 2011-03-02 MED ORDER — HYDRALAZINE HCL 50 MG PO TABS
50.0000 mg | ORAL_TABLET | Freq: Two times a day (BID) | ORAL | Status: DC
  Administered 2011-03-02 – 2011-03-07 (×10): 50 mg via ORAL
  Filled 2011-03-02 (×13): qty 1

## 2011-03-02 MED ORDER — SODIUM CHLORIDE 0.9 % IJ SOLN: 3.0000 mL | Freq: Two times a day (BID) | INTRAMUSCULAR | Status: DC

## 2011-03-02 MED ORDER — DIAZEPAM 2 MG PO TABS: 2.0000 mg | ORAL_TABLET | ORAL | Status: DC | PRN

## 2011-03-02 MED ORDER — SODIUM CHLORIDE 0.9 % IV SOLN
250.0000 mL | INTRAVENOUS | Status: DC | PRN
  Administered 2011-03-04: 250 mL via INTRAVENOUS

## 2011-03-02 MED ORDER — OXYCODONE-ACETAMINOPHEN 5-325 MG PO TABS: 1.0000 | ORAL_TABLET | ORAL | Status: DC | PRN

## 2011-03-02 MED ORDER — OLMESARTAN MEDOXOMIL 40 MG PO TABS
40.0000 mg | ORAL_TABLET | Freq: Every day | ORAL | Status: DC
  Administered 2011-03-02 – 2011-03-07 (×6): 40 mg via ORAL
  Filled 2011-03-02 (×6): qty 1

## 2011-03-02 MED ORDER — NITROGLYCERIN 0.2 MG/ML ON CALL CATH LAB
INTRAVENOUS | Status: AC
  Filled 2011-03-02: qty 1

## 2011-03-02 MED ORDER — CLOPIDOGREL BISULFATE 75 MG PO TABS
75.0000 mg | ORAL_TABLET | Freq: Every day | ORAL | Status: DC
  Administered 2011-03-03 – 2011-03-07 (×5): 75 mg via ORAL
  Filled 2011-03-02 (×5): qty 1

## 2011-03-02 MED ORDER — ASPIRIN 81 MG PO CHEW
324.0000 mg | CHEWABLE_TABLET | ORAL | Status: AC
  Administered 2011-03-02: 324 mg via ORAL
  Filled 2011-03-02: qty 4

## 2011-03-02 MED ORDER — SODIUM CHLORIDE 0.9 % IJ SOLN
3.0000 mL | Freq: Two times a day (BID) | INTRAMUSCULAR | Status: DC
  Administered 2011-03-02 – 2011-03-07 (×7): 3 mL via INTRAVENOUS

## 2011-03-02 MED ORDER — CLONIDINE HCL 0.3 MG/24HR TD PTWK
0.3000 mg | MEDICATED_PATCH | TRANSDERMAL | Status: DC
  Administered 2011-03-02: 0.3 mg via TRANSDERMAL
  Filled 2011-03-02: qty 1

## 2011-03-02 MED ORDER — INSULIN GLULISINE 100 UNIT/ML ~~LOC~~ SOLN: 5.0000 [IU] | Freq: Three times a day (TID) | SUBCUTANEOUS | Status: DC

## 2011-03-02 MED ORDER — ACETAMINOPHEN 325 MG PO TABS: 650.0000 mg | ORAL_TABLET | ORAL | Status: DC | PRN

## 2011-03-02 MED ORDER — CLOPIDOGREL BISULFATE 75 MG PO TABS
75.0000 mg | ORAL_TABLET | Freq: Every day | ORAL | Status: DC
  Administered 2011-03-02: 75 mg via ORAL
  Filled 2011-03-02: qty 1

## 2011-03-02 MED ORDER — SEVELAMER HCL 800 MG PO TABS
3200.0000 mg | ORAL_TABLET | Freq: Three times a day (TID) | ORAL | Status: DC
  Filled 2011-03-02 (×6): qty 4

## 2011-03-02 MED ORDER — ADULT MULTIVITAMIN W/MINERALS CH
1.0000 | ORAL_TABLET | Freq: Every day | ORAL | Status: DC
  Administered 2011-03-02 – 2011-03-07 (×5): 1 via ORAL
  Filled 2011-03-02 (×6): qty 1

## 2011-03-02 MED ORDER — ACETAMINOPHEN 325 MG PO TABS
650.0000 mg | ORAL_TABLET | ORAL | Status: DC | PRN
  Administered 2011-03-05 – 2011-03-06 (×2): 650 mg via ORAL
  Filled 2011-03-02: qty 2

## 2011-03-02 MED ORDER — LIDOCAINE HCL (PF) 1 % IJ SOLN
INTRAMUSCULAR | Status: AC
  Filled 2011-03-02: qty 30

## 2011-03-02 MED ORDER — SIMVASTATIN 40 MG PO TABS
40.0000 mg | ORAL_TABLET | Freq: Every day | ORAL | Status: DC
  Administered 2011-03-02: 40 mg via ORAL
  Filled 2011-03-02 (×2): qty 1

## 2011-03-02 MED ORDER — DIALYVITE 3000 3 MG PO TABS: 1.0000 | ORAL_TABLET | Freq: Every day | ORAL | Status: DC

## 2011-03-02 MED ORDER — INSULIN GLARGINE 100 UNIT/ML ~~LOC~~ SOLN
10.0000 [IU] | Freq: Every day | SUBCUTANEOUS | Status: DC
  Administered 2011-03-02 – 2011-03-06 (×5): 10 [IU] via SUBCUTANEOUS
  Filled 2011-03-02 (×3): qty 3

## 2011-03-02 MED ORDER — CARVEDILOL 12.5 MG PO TABS
12.5000 mg | ORAL_TABLET | Freq: Two times a day (BID) | ORAL | Status: DC
  Administered 2011-03-02 – 2011-03-07 (×10): 12.5 mg via ORAL
  Filled 2011-03-02 (×14): qty 1

## 2011-03-02 MED ORDER — CINACALCET HCL 30 MG PO TABS
30.0000 mg | ORAL_TABLET | Freq: Every day | ORAL | Status: DC
  Administered 2011-03-03 – 2011-03-07 (×4): 30 mg via ORAL
  Filled 2011-03-02 (×7): qty 1

## 2011-03-02 NOTE — Brief Op Note (Signed)
See operative note Melissa [name] 1:43 PM 03/02/2011

## 2011-03-02 NOTE — Progress Notes (Signed)
Interim History:  70yof who presented to [facility] with acute on chronic diastolic chf after missing dialysis due to the weather. She presented with increased dyspnea and volume overload. Was feeling better after diuresis and dialysis (she is normally TThSat dialysis but has been MWF during hospitalization, needs dialysis today), but ruled in for NSTEMI. It was thought this was likely due to myocardial strain from the chf exacerbation, but it was felt she needed further ischemic evaluation prior to discharge. A Lexiscan myoview was completed revealing anterolateral scar, peri-infarct ischemia, and EF 35% (previous EF was 50-55% by echo). She was transferred to [facility] today for cardiac catheterization.   Shunsuke Granzow 03/02/2011 Dejanae Helser

## 2011-03-02 NOTE — Op Note (Signed)
Catheterization  Indication:  CHF, AS and SEMI  Procedure: After informed consent and clinical "time out" the right groin was prepped and draped in a sterile fashion.  A 5Fr sheath was placed in the right femoral artery using seldinger technique and local lidocaine.  Standard JL4, JR4 and angled pigtail catheters were used to engage the coronary arteries.  Coronary arteries were visualized in orthogonal views using caudal and cranial angulation.  RAO ventriculography was done using 22* cc of contrast.  A 7Fr venous sheath was used for right heart cath.  Medications:   Versed: 2 mg's  Fentanyl: 0 ug's  Coronary Arteries: Right dominant with no anomalies  LM:   LAD:  100% mid occlusion.  95% lesion prior to take off of large D1    Circumflex:  70% ostial,  70% mid vessel  Distal normal  OM1: 50% ostial  OM2: normal  RCA: 30% proximal disease  Ostium of PDA 60%  Ventriculography: EF: 30% %, diffuse hypokinesis worse in the anterior wall and apex  Hemodynamics: Right Heart:  MRA:  5 mmHg RV: 46/2 mmHg PA: 40/12 mmHg PCWP:  Mean 19 mmHg LV:  175/9 mmHg Ao:  142/49 Mean aortic valve gradient 17.6 mmHg  Pk to Pk gradient 28 mmHg AVA by Gorlin 1.5 cm2   CO: 6.51  CI:  3.37  High output from dialysis fistula     Impression:    Will have interventionalist review.  There was some ischemia on the anterolateral wall from myovue.  Consdier PCI LAD as it enters D1  Also review circumflex lesion Will need dialysis over weekend.  AS is likely moderate given the degree of LV dysfunction but not critical.    Charlton Haws 03/02/2011 1:47 PM

## 2011-03-02 NOTE — H&P (Signed)
  See scanned H&P from [facility].  Dr Degent 71 yo dialysis patient with CHF and SEMI.  Echo with EF 35% and moderate AS Myovue with anterolateral ischemia/infarct Transferred for left and right heart cath.  Charlton Haws 1:45 PM 03/02/2011

## 2011-03-02 NOTE — Progress Notes (Signed)
ANTICOAGULATION CONSULT NOTE - Initial Consult  Pharmacy Consult for heparin Indication: s/p cath for ACS/STEMI; potential PCI  Allergies  Allergen Reactions  . Other Nausea And Vomiting    NARCOTICS    Patient Measurements:   Heparin Dosing Weight: 74.7kg  Vital Signs: Temp: 98.8 F (37.1 C) (02/01 1100) BP: 158/73 mmHg (02/01 1100) Pulse Rate: 72  (02/01 1311)  Labs: No results found for this basename: HGB:2,HCT:3,PLT:3,APTT:3,LABPROT:3,INR:3,HEPARINUNFRC:3,CREATININE:3,CKTOTAL:3,CKMB:3,TROPONINI:3 in the last 72 hours The CrCl is unknown because both a height and weight (above a minimum accepted value) are required for this calculation.  Medical History: Past Medical History  Diagnosis Date  . Coronary artery disease     catheterization March 2011.(presentation wtih mild CHF and mild increased troponin))..70% proximal/80% mid LAD,50% circumflex.Marland KitchenLAD could be approached but with difficulty....medicall therapy recommended/NSTEMI 01/31/2010 with sepsis and anemia...EF 50%...medical Rx  . Diabetes mellitus   . Hyperlipidemia   . Aortic stenosis     mild to moderate...echo.Marland KitchenMarland KitchenSeptember,2006/catheterization valve area 1.9cm square/mod/severe....echo...10/2009/mod/severe....echo....01/31/2010  . 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 Hypertension   . Anemia   . CVA (cerebral vascular accident)   . Chronic kidney disease     ESRD...dialysis  . Carotid artery disease     doppler 2011 /  doppler 05/08/2010 similar, <50% bilateral with some plaque  . Edema     Medications:  Scheduled:    . amLODipine  5 mg Oral Daily  . aspirin  324 mg Oral Pre-Cath  . aspirin EC  325 mg Oral Daily  . carvedilol  12.5 mg Oral BID  . cinacalcet  30 mg Oral Daily  . cloNIDine  0.3 mg Transdermal Weekly  . clopidogrel  75 mg Oral Q breakfast  . doxazosin  8 mg Oral QHS  . heparin      . hydrALAZINE  50 mg Oral BID  . insulin aspart  0-15 Units  Subcutaneous TID WC  . insulin aspart  0-5 Units Subcutaneous QHS  . insulin glargine  10 Units Subcutaneous QHS  . lidocaine      . mulitivitamin with minerals  1 tablet Oral Daily  . nitroGLYCERIN      . olmesartan  40 mg Oral Daily  . pantoprazole  40 mg Oral Q1200  . sevelamer  3,200 mg Oral TID WC  . simvastatin  40 mg Oral QHS  . sodium chloride  3 mL Intravenous Q12H  . DISCONTD: aspirin EC  81 mg Oral Daily  . DISCONTD: clopidogrel  75 mg Oral Daily  . DISCONTD: folic acid-vitamin b complex-vitamin c-selenium-zinc  1 tablet Oral Daily  . DISCONTD: insulin glulisine  5-15 Units Subcutaneous TID AC  . DISCONTD: sodium chloride  3 mL Intravenous Q12H   Infusions:    Assessment: 71 yo female s/p cath for STEMI will be started on heparin 8 hrs post sheath removal.  Bedrest per RN was at 1445.  Potential PCI LAD per MD's note Goal of Therapy:  Heparin level 0.3-0.7 units/ml   Plan:  1) Start heparin at 900 units/hr (= 9 ml/hr) at 2245. No bolus. 2) Check an 8 hour heparin level after drip is started at 0645. 3) Daily heparin level and CBC  Rhianon Zabawa, Tsz-Yin 03/02/2011,4:34 PM

## 2011-03-03 ENCOUNTER — Encounter (HOSPITAL_COMMUNITY): Payer: Self-pay | Admitting: *Deleted

## 2011-03-03 ENCOUNTER — Inpatient Hospital Stay (HOSPITAL_COMMUNITY): Payer: Medicare Other

## 2011-03-03 LAB — BASIC METABOLIC PANEL
BUN: 53 mg/dL — ABNORMAL HIGH (ref 6–23)
CO2: 26 mEq/L (ref 19–32)
Calcium: 9.9 mg/dL (ref 8.4–10.5)
Chloride: 98 mEq/L (ref 96–112)
Creatinine, Ser: 8.39 mg/dL — ABNORMAL HIGH (ref 0.50–1.10)
Glucose, Bld: 95 mg/dL (ref 70–99)

## 2011-03-03 LAB — CBC
HCT: 33 % — ABNORMAL LOW (ref 36.0–46.0)
MCH: 32.7 pg (ref 26.0–34.0)
MCHC: 32.1 g/dL (ref 30.0–36.0)
MCV: 101.9 fL — ABNORMAL HIGH (ref 78.0–100.0)
Platelets: 114 10*3/uL — ABNORMAL LOW (ref 150–400)
RDW: 15.9 % — ABNORMAL HIGH (ref 11.5–15.5)
WBC: 6.4 10*3/uL (ref 4.0–10.5)

## 2011-03-03 LAB — LIPID PANEL
Cholesterol: 127 mg/dL (ref 0–200)
HDL: 27 mg/dL — ABNORMAL LOW (ref >39)
LDL Cholesterol: 63 mg/dL (ref 0–99)
Triglycerides: 184 mg/dL — ABNORMAL HIGH (ref <150)

## 2011-03-03 MED ORDER — HEPARIN SOD (PORCINE) IN D5W 100 UNIT/ML IV SOLN
1200.0000 [IU]/h | INTRAVENOUS | Status: DC
  Administered 2011-03-03: 1200 [IU]/h via INTRAVENOUS
  Filled 2011-03-03: qty 250

## 2011-03-03 MED ORDER — POLYETHYLENE GLYCOL 3350 17 G PO PACK
17.0000 g | PACK | Freq: Every day | ORAL | Status: DC | PRN
  Administered 2011-03-03: 17 g via ORAL
  Filled 2011-03-03: qty 1

## 2011-03-03 MED ORDER — ROSUVASTATIN CALCIUM 10 MG PO TABS
10.0000 mg | ORAL_TABLET | Freq: Every day | ORAL | Status: DC
  Administered 2011-03-03 – 2011-03-06 (×4): 10 mg via ORAL
  Filled 2011-03-03 (×7): qty 1

## 2011-03-03 MED ORDER — SODIUM CHLORIDE 0.9 % IV SOLN
62.5000 mg | Freq: Once | INTRAVENOUS | Status: AC
  Administered 2011-03-06: 62.5 mg via INTRAVENOUS
  Filled 2011-03-03 (×2): qty 5

## 2011-03-03 MED ORDER — SEVELAMER CARBONATE 800 MG PO TABS
3200.0000 mg | ORAL_TABLET | Freq: Three times a day (TID) | ORAL | Status: DC
  Administered 2011-03-03 – 2011-03-07 (×8): 3200 mg via ORAL
  Filled 2011-03-03 (×16): qty 4

## 2011-03-03 MED ORDER — ALTEPLASE 2 MG IJ SOLR
2.0000 mg | Freq: Once | INTRAMUSCULAR | Status: AC | PRN
  Filled 2011-03-03: qty 2

## 2011-03-03 MED ORDER — LIDOCAINE HCL (PF) 1 % IJ SOLN
5.0000 mL | INTRAMUSCULAR | Status: DC | PRN
  Filled 2011-03-03 (×2): qty 5

## 2011-03-03 MED ORDER — CLOPIDOGREL BISULFATE 75 MG PO TABS: 75.0000 mg | ORAL_TABLET | Freq: Every day | ORAL | Status: DC

## 2011-03-03 MED ORDER — SENNOSIDES-DOCUSATE SODIUM 8.6-50 MG PO TABS
1.0000 | ORAL_TABLET | Freq: Every day | ORAL | Status: DC
  Administered 2011-03-03 – 2011-03-06 (×4): 1 via ORAL
  Filled 2011-03-03 (×8): qty 1

## 2011-03-03 MED ORDER — PENTAFLUOROPROP-TETRAFLUOROETH EX AERO: 1.0000 "application " | INHALATION_SPRAY | CUTANEOUS | Status: DC | PRN

## 2011-03-03 MED ORDER — HEPARIN SODIUM (PORCINE) 1000 UNIT/ML DIALYSIS
20.0000 [IU]/kg | INTRAMUSCULAR | Status: DC | PRN
  Filled 2011-03-03: qty 2

## 2011-03-03 MED ORDER — DARBEPOETIN ALFA-POLYSORBATE 25 MCG/0.42ML IJ SOLN
25.0000 ug | INTRAMUSCULAR | Status: DC
  Filled 2011-03-03 (×2): qty 0.42

## 2011-03-03 MED ORDER — DARBEPOETIN ALFA-POLYSORBATE 25 MCG/0.42ML IJ SOLN
25.0000 ug | INTRAMUSCULAR | Status: DC
  Administered 2011-03-03: 25 ug via INTRAVENOUS
  Filled 2011-03-03 (×2): qty 0.42

## 2011-03-03 MED ORDER — LIDOCAINE-PRILOCAINE 2.5-2.5 % EX CREA
1.0000 "application " | TOPICAL_CREAM | CUTANEOUS | Status: DC | PRN
  Filled 2011-03-03: qty 5

## 2011-03-03 MED ORDER — HEPARIN SODIUM (PORCINE) 1000 UNIT/ML DIALYSIS
1000.0000 [IU] | INTRAMUSCULAR | Status: DC | PRN
  Filled 2011-03-03: qty 1

## 2011-03-03 MED ORDER — NEPRO/CARBSTEADY PO LIQD
237.0000 mL | ORAL | Status: DC | PRN
  Filled 2011-03-03: qty 237

## 2011-03-03 MED ORDER — SODIUM CHLORIDE 0.9 % IV SOLN: 100.0000 mL | INTRAVENOUS | Status: DC | PRN

## 2011-03-03 MED ORDER — HEPARIN SOD (PORCINE) IN D5W 100 UNIT/ML IV SOLN
1450.0000 [IU]/h | INTRAVENOUS | Status: DC
  Administered 2011-03-04 (×2): 1450 [IU]/h via INTRAVENOUS
  Filled 2011-03-03: qty 250

## 2011-03-03 MED ORDER — DOXERCALCIFEROL 4 MCG/2ML IV SOLN
2.5000 ug | INTRAVENOUS | Status: DC
  Administered 2011-03-06: 2.5 ug via INTRAVENOUS
  Filled 2011-03-03: qty 2

## 2011-03-03 NOTE — Progress Notes (Signed)
CARDIAC REHAB PHASE I   PRE:  Rate/Rhythm: 63 nsr:  Supine:  140/64 Sitting:   Standing:    SaO2: 94%  MODE:  Ambulation: 52 ft   POST:  Rate/Rhythem: 94  BP:  Supine: 138/70  Sitting:   Standing:    SaO2: 97% 1315-1345 Order received and appreciated, chart reviewed work up completed.  Pt ambulated in hallway x 2 assist used rolling walker .  Pt c/o dypsnea on exertion with 3 standing rest breaks. Will f/u on Monday  Deveron Furlong [place]

## 2011-03-03 NOTE — Progress Notes (Signed)
Patient claims she is a DNR. No paperwork noted in chart. Please address.  Harless Litten, RN 03/03/11

## 2011-03-03 NOTE — Progress Notes (Signed)
Subjective:  Tolerated cath well. Not SOB, no chest pain.  Last dialysis was Weds. She normally does Tues/Thurs/Sat  Objective:  Vital Signs in the last 24 hours: BP 133/72  Pulse 68  Temp(Src) 98.5 F (36.9 C) (Oral)  Resp 18  Wt 90.5 kg (199 lb 8.3 oz)  SpO2 94%  Physical Exam: Obese WF, in NAD Lungs:  Clear to A&P Cardiac:  Regular rhythm, normal S1 and S2, no S3 harsh 3/6 sys murmur of AS Abdomen:  Soft, nontender, no masses Extremities:  1+ edema present  Intake/Output from previous day: 02/01 0701 - 02/02 0700 In: 3 [I.V.:3] Out: -   Lab Results: Basic Metabolic Panel:  [facility] 03/03/11 0625  NA 139  K 4.4  CL 98  CO2 26  GLUCOSE 95  BUN 53*  CREATININE 8.39*    CBC:  Basename 03/03/11 0625  WBC 6.4  NEUTROABS --  HGB 10.6*  HCT 33.0*  MCV 101.9*  PLT 114*    Telemetry: Reviewed :   Sinus rhythm  Assessment/Plan:  1. CAD with LAD disease for PCI next week 2. ESRD on dialysis in need of  3. Ischemic cardiomyopathy 4. Moderate AS  REC:  Contacted Dr. Arta Silence for dialysis.  Await PCI next week.   Darden Palmer.  MD [facility] 03/03/2011, 12:19 PM

## 2011-03-03 NOTE — Consult Note (Signed)
Melissa [name] 03/03/2011 Bennette Hasty D Requesting Physician:  Dr. Antoine Poche  Reason for Consult:  Need for dialysis in patient with NSTEMI and CAD by cath HPI: The patient is a 71 y.o. year-old with ESRD on HD 5 years at [facility], also hx of MI x 2 in the past wihtout intervention.  Presented with acute CHF after missing HD due to weather.  Admitted at [facility] and improved with HD/UF.  Enzymes were + for NSTEMI and a Lexiscan showed anterior scar and + ischemia. Patient transferred to [facility] and underwent heart cath yesterday, which showed significant LAD disease and EF of 30% with anterior hypokinesis.  Plan is for possible PCI next week.  Needs HD today, last HD was 1/30.  Has mild/mod AS as well.  No complaints right now, no SOB or orthopnea.    Outpatient HD: TTS at [facility]    Neph:  Dr. Fausto Skillern    EDW: 92 kg   Duration: 3\' 15"     EPO: 5500u  Iron: Venofer 50mg /wk  VitD: Hectorol 2.5 ug     Heparin: 2000 bolus> 400u/hr  Past Medical History:  Past Medical History  Diagnosis Date  . Coronary artery disease     catheterization March 2011.(presentation wtih mild CHF and mild increased troponin))..70% proximal/80% mid LAD,50% circumflex.Marland KitchenLAD could be approached but with difficulty....medicall therapy recommended/NSTEMI 01/31/2010 with sepsis and anemia...EF 50%...medical Rx  . Diabetes mellitus   . Hyperlipidemia   . Aortic stenosis     mild to moderate...echo.Marland KitchenMarland KitchenSeptember,2006/catheterization valve area 1.9cm square/mod/severe....echo...10/2009/mod/severe....echo....01/31/2010  . 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 Hypertension   . Anemia   . CVA (cerebral vascular accident)   . Chronic kidney disease     ESRD...dialysis  . Carotid artery disease     doppler 07/06/09 /  doppler 05/08/2010 similar, <50% bilateral with some plaque  . Edema     Past Surgical History:  Past Surgical History  Procedure Date  . Appendectomy   . Oophorectomy    . Toe amputation     left great toe  . Retinal detachment surgery   . Cataract extraction     Family History:  Family History  Problem Relation Age of Onset  . Coronary artery disease Mother     died age 22  . Diabetes type II Mother   . Other Father     died in 07-07-2022 of accident  . Diabetes type II Daughter   . Hiatal hernia Son    Social History:  reports that she has never smoked. She has never used smokeless tobacco. She reports that she does not drink alcohol or use illicit drugs.  Allergies:  Allergies  Allergen Reactions  . Other Nausea And Vomiting    NARCOTICS    Home medications: Prior to Admission medications   Medication Sig Start Date End Date Taking? Authorizing Provider  amLODipine (NORVASC) 5 MG tablet Take 5 mg by mouth daily.   Yes Historical Provider, MD  aspirin EC 81 MG tablet Take 81 mg by mouth daily.   Yes Historical Provider, MD  carvedilol (COREG) 12.5 MG tablet Take 1 tablet (12.5 mg total) by mouth 2 (two) times daily. 02/19/11  Yes Luis Abed, MD  cinacalcet (SENSIPAR) 30 MG tablet Take 30 mg by mouth daily.    Yes Historical Provider, MD  cloNIDine (CATAPRES - DOSED IN MG/24 HR) 0.3 mg/24hr Place 1 patch onto the skin once a week.  Yes Historical Provider, MD  clopidogrel (PLAVIX) 75 MG tablet Take 75 mg by mouth daily.     Yes Historical Provider, MD  doxazosin (CARDURA) 8 MG tablet Take 8 mg by mouth at bedtime.   Yes Historical Provider, MD  folic acid-vitamin b complex-vitamin c-selenium-zinc (DIALYVITE) 3 MG TABS Take 1 tablet by mouth daily.     Yes Historical Provider, MD  furosemide (LASIX) 40 MG tablet Take 1 tablet (40 mg total) by mouth daily. 04/29/10 04/29/11 Yes Luis Abed, MD  hydrALAZINE (APRESOLINE) 50 MG tablet Take 50 mg by mouth 2 (two) times daily.     Yes Historical Provider, MD  insulin glargine (LANTUS) 100 UNIT/ML injection Inject 10 Units into the skin at bedtime. Patient does not take if blood sugar is 100 or  below.   Yes Historical Provider, MD  insulin glulisine (APIDRA) 100 UNIT/ML injection Inject 5-15 Units into the skin 3 (three) times daily before meals. Sliding scale   Yes Historical Provider, MD  olmesartan (BENICAR) 40 MG tablet Take 40 mg by mouth daily.     Yes Historical Provider, MD  omeprazole (PRILOSEC) 20 MG capsule Take 20 mg by mouth daily.     Yes Historical Provider, MD  polyethylene glycol powder (GLYCOLAX/MIRALAX) powder Take 68 g by mouth 3 (three) times a week. Tuesday, Thursday, Sunday   Yes Historical Provider, MD  sevelamer (RENAGEL) 800 MG tablet Take 1,600-3,200 mg by mouth See admin instructions. Takes 3200 mg with meals and 1600mg  with each snack   Yes Historical Provider, MD  simvastatin (ZOCOR) 40 MG tablet Take 40 mg by mouth at bedtime.     Yes Historical Provider, MD  sodium polystyrene (KAYEXALATE) 15 GM/60ML suspension Take 15 g by mouth 2 (two) times a week. Patient takes 15 g on Saturday and Sunday.   Yes Historical Provider, MD    Inpatient medications:    . amLODipine  5 mg Oral Daily  . aspirin EC  325 mg Oral Daily  . carvedilol  12.5 mg Oral BID  . cinacalcet  30 mg Oral Daily  . cloNIDine  0.3 mg Transdermal Weekly  . clopidogrel  75 mg Oral Q breakfast  . doxazosin  8 mg Oral QHS  . hydrALAZINE  50 mg Oral BID  . insulin aspart  0-15 Units Subcutaneous TID WC  . insulin aspart  0-5 Units Subcutaneous QHS  . insulin glargine  10 Units Subcutaneous QHS  . mulitivitamin with minerals  1 tablet Oral Daily  . olmesartan  40 mg Oral Daily  . pantoprazole  40 mg Oral Q1200  . sevelamer  3,200 mg Oral TID WC  . simvastatin  40 mg Oral QHS  . sodium chloride  3 mL Intravenous Q12H  . DISCONTD: aspirin EC  81 mg Oral Daily  . DISCONTD: clopidogrel  75 mg Oral Daily  . DISCONTD: clopidogrel  75 mg Oral Q breakfast  . DISCONTD: sevelamer  3,200 mg Oral TID WC  . DISCONTD: sodium chloride  3 mL Intravenous Q12H    Review of Systems Gen:  Denies  headache, fever, chills, sweats.  No weight loss. HEENT:  No visual change, sore throat, difficulty swallowing. Resp:  No difficulty breathing, DOE.  No cough or hemoptysis. Cardiac:  No chest pain, orthopnea, PND.  Denies edema. GI:   Denies abdominal pain.   No nausea, vomiting, diarrhea.  +constipation. GU:  Minimal voiding     MS:  Denies joint pain or swelling.  Derm:  Denies skin rash or itching.  No chronic skin conditions.  Neuro:   Denies focal weakness, memory problems, hx stroke or TIA.   Psych:  Denies symptoms of depression of anxiety.  No hallucination.    Physical Exam:  Blood pressure 133/72, pulse 68, temperature 98.5 F (36.9 C), temperature source Oral, resp. rate 18, weight 90.5 kg (199 lb 8.3 oz), SpO2 94.00%.  Gen: alert, elderly female, no distress Skin: no rash, cyanosis Neck: no JVD, bruits or LAN Chest: minimal crackles at the bases Heart: regular, no rub or gallop Abdomen: soft, nontender, no masses, no ascites, firm Ext: trace ankle edema only Neuro: alert, Ox3, no focal deficit Heme/Lymph: no bruising or LAN  Labs: Basic Metabolic Panel:  Lab 03/03/11 1610  NA 139  K 4.4  CL 98  CO2 26  GLUCOSE 95  BUN 53*  CREATININE 8.39*  ALB --  CALCIUM 9.9  PHOS --   Liver Function Tests: No results found for this basename: AST:3,ALT:3,ALKPHOS:3,BILITOT:3,PROT:3,ALBUMIN:3 in the last 168 hours No results found for this basename: LIPASE:3,AMYLASE:3 in the last 168 hours No results found for this basename: AMMONIA:3 in the last 168 hours CBC:  Lab 03/03/11 0625  WBC 6.4  NEUTROABS --  HGB 10.6*  HCT 33.0*  MCV 101.9*  PLT 114*   PT/INR: @labrcntip (inr:5) Cardiac Enzymes: No results found for this basename: CKTOTAL:5,CKMB:5,CKMBINDEX:5,TROPONINI:5 in the last 168 hours CBG:  Lab 03/03/11 1201 03/02/11 1811 03/02/11 1409  GLUCAP 119* 167* 109*    Iron Studies: No results found for this basename: IRON:30,TIBC:30,TRANSFERRIN:30,FERRITIN:30 in  the last 168 hours  Xrays/Other Studies: No results found.  Outpatient HD: TTS at [facility]    Neph:  Dr. Fausto Skillern    EDW: 92 kg   Duration: 3\' 15"     EPO: 5500u  Iron: Venofer 50mg /wk  VitD: Hectorol 2.5 ug     Heparin: 2000 bolus> 400u/hr   Assessment/Plan 1. ESRD- dialysis today, usual time, remove 2-3 kg as tolerated.  TTS from [facility], using L arm AVG. 2.  NSTEMI/CAD by cath- per cardiology, possible PCI next week 3.  VOlume - no sig excess.  EF 30% 4.  Anemia-  EPO with HD 5.  Sec HPTH-  Vit D with HD, renagel.  Will follow.   Vinson Moselle, MD [facility] [phone] pager   [phone] cell 03/03/2011, 2:10 PM

## 2011-03-03 NOTE — Progress Notes (Signed)
ANTICOAGULATION CONSULT NOTE - Initial Consult  Pharmacy Consult for heparin Indication: s/p cath for ACS/STEMI; potential PCI  Allergies  Allergen Reactions  . Other Nausea And Vomiting    NARCOTICS    Patient Measurements: Weight: 199 lb 8.3 oz (90.5 kg) Heparin Dosing Weight: 74.7kg  Vital Signs: Temp: 98.5 F (36.9 C) (02/02 0500) Temp src: Oral (02/02 0500) BP: 133/72 mmHg (02/02 0933) Pulse Rate: 68  (02/02 0934)  Labs:  Basename 03/03/11 0625  HGB 10.6*  HCT 33.0*  PLT 114*  APTT --  LABPROT --  INR --  HEPARINUNFRC <0.10*  CREATININE 8.39*  CKTOTAL --  CKMB --  TROPONINI --   The CrCl is unknown because both a height and weight (above a minimum accepted value) are required for this calculation.  Medical History: Past Medical History  Diagnosis Date  . Coronary artery disease     catheterization March 2011.(presentation wtih mild CHF and mild increased troponin))..70% proximal/80% mid LAD,50% circumflex.Marland KitchenLAD could be approached but with difficulty....medicall therapy recommended/NSTEMI 01/31/2010 with sepsis and anemia...EF 50%...medical Rx  . Diabetes mellitus   . Hyperlipidemia   . Aortic stenosis     mild to moderate...echo.Marland KitchenMarland KitchenSeptember,2006/catheterization valve area 1.9cm square/mod/severe....echo...10/2009/mod/severe....echo....01/31/2010  . 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 Hypertension   . Anemia   . CVA (cerebral vascular accident)   . Chronic kidney disease     ESRD...dialysis  . Carotid artery disease     doppler 2011 /  doppler 05/08/2010 similar, <50% bilateral with some plaque  . Edema     Medications:  Scheduled:     . amLODipine  5 mg Oral Daily  . aspirin  324 mg Oral Pre-Cath  . aspirin EC  325 mg Oral Daily  . carvedilol  12.5 mg Oral BID  . cinacalcet  30 mg Oral Daily  . cloNIDine  0.3 mg Transdermal Weekly  . clopidogrel  75 mg Oral Q breakfast  . doxazosin  8 mg Oral QHS  .  heparin      . hydrALAZINE  50 mg Oral BID  . insulin aspart  0-15 Units Subcutaneous TID WC  . insulin aspart  0-5 Units Subcutaneous QHS  . insulin glargine  10 Units Subcutaneous QHS  . lidocaine      . mulitivitamin with minerals  1 tablet Oral Daily  . nitroGLYCERIN      . olmesartan  40 mg Oral Daily  . pantoprazole  40 mg Oral Q1200  . sevelamer  3,200 mg Oral TID WC  . simvastatin  40 mg Oral QHS  . sodium chloride  3 mL Intravenous Q12H  . DISCONTD: aspirin EC  81 mg Oral Daily  . DISCONTD: clopidogrel  75 mg Oral Daily  . DISCONTD: folic acid-vitamin b complex-vitamin c-selenium-zinc  1 tablet Oral Daily  . DISCONTD: insulin glulisine  5-15 Units Subcutaneous TID AC  . DISCONTD: sevelamer  3,200 mg Oral TID WC  . DISCONTD: sodium chloride  3 mL Intravenous Q12H   Infusions:     . heparin 900 Units/hr (03/02/11 2353)    Assessment: 71 yo female s/p cath for STEMI (Potential PCI LAD per MD's note) on heparin drip 900 units/hr.  Heparin level <0.1 this AM. Goal HL = 0.3-0.7 units/ml. No bleeding or hematoma reported.   Goal of Therapy:  Heparin level 0.3-0.7 units/ml   Plan:  1) Increase heparin to 1200 units/hr. 2) Check an 8 hour heparin level  3) Daily  heparin level and CBC  Arman Filter 03/03/2011,11:02 AM

## 2011-03-03 NOTE — Progress Notes (Signed)
ANTICOAGULATION CONSULT NOTE - Follow Up Consult  Pharmacy Consult for Heparin Indication: s/p cath for ACS/STEMI; PCI planned for next week  Allergies  Allergen Reactions  . Other Nausea And Vomiting    NARCOTICS    Patient Measurements: Weight: 193 lb 5.5 oz (87.7 kg) Heparin Dosing Weight: 74.7kg  Vital Signs: Temp: 97.8 F (36.6 C) (02/02 1915) Temp src: Oral (02/02 1915) BP: 128/56 mmHg (02/02 1933) Pulse Rate: 68  (02/02 2140)  Labs:  Basename 03/03/11 2010 03/03/11 0625  HGB -- 10.6*  HCT -- 33.0*  PLT -- 114*  APTT -- --  LABPROT -- --  INR -- --  HEPARINUNFRC 0.14* <0.10*  CREATININE -- 8.39*  CKTOTAL -- --  CKMB -- --  TROPONINI -- --   The CrCl is unknown because both a height and weight (above a minimum accepted value) are required for this calculation.   Medications:  Heparin 1200 units/hr  Assessment: 71 yo female s/p cath for STEMI on heparin awaiting PCI next week. Heparin level (0.14) is subtherapeutic. RN reports no problems with IV line/infusion.  - H/H and Plts low - No significant bleeding reported  Goal of Therapy:  Heparin level 0.3-0.7 units/ml   Plan:  1. Increase heparin drip to 1450 units/hr (14.5 ml/hr) 2. Follow-up AM heparin level  Cleon Dew [phone] 03/03/2011,10:25 PM

## 2011-03-04 LAB — BASIC METABOLIC PANEL
CO2: 27 mEq/L (ref 19–32)
Chloride: 98 mEq/L (ref 96–112)
GFR calc Af Amer: 7 mL/min — ABNORMAL LOW (ref >90)
Potassium: 3.9 mEq/L (ref 3.5–5.1)
Sodium: 136 mEq/L (ref 135–145)

## 2011-03-04 LAB — HEPATITIS B SURFACE ANTIGEN: Hepatitis B Surface Ag: NEGATIVE

## 2011-03-04 LAB — CBC
HCT: 32.6 % — ABNORMAL LOW (ref 36.0–46.0)
Hemoglobin: 10.5 g/dL — ABNORMAL LOW (ref 12.0–15.0)
MCV: 102.2 fL — ABNORMAL HIGH (ref 78.0–100.0)
RBC: 3.19 MIL/uL — ABNORMAL LOW (ref 3.87–5.11)
WBC: 7 10*3/uL (ref 4.0–10.5)

## 2011-03-04 LAB — HEPARIN LEVEL (UNFRACTIONATED)
Heparin Unfractionated: 0.39 IU/mL (ref 0.30–0.70)
Heparin Unfractionated: 0.32 IU/mL (ref 0.30–0.70)

## 2011-03-04 LAB — GLUCOSE, CAPILLARY
Glucose-Capillary: 107 mg/dL — ABNORMAL HIGH (ref 70–99)
Glucose-Capillary: 131 mg/dL — ABNORMAL HIGH (ref 70–99)

## 2011-03-04 NOTE — Progress Notes (Signed)
ANTICOAGULATION CONSULT NOTE - Follow Up Consult  Pharmacy Consult for Heparin Indication: s/p cath for ACS/STEMI; PCI planned for next week  Allergies  Allergen Reactions  . Other Nausea And Vomiting    NARCOTICS    Patient Measurements: Weight: 195 lb 5.2 oz (88.6 kg) Heparin Dosing Weight: 74.7kg  Vital Signs: Temp: 98.3 F (36.8 C) (02/03 0500) BP: 121/62 mmHg (02/03 0942) Pulse Rate: 62  (02/03 0943)  Labs:  Basename 03/04/11 1023 03/04/11 0635 03/03/11 2010 03/03/11 0625  HGB -- 10.5* -- 10.6*  HCT -- 32.6* -- 33.0*  PLT -- 83* -- 114*  APTT -- -- -- --  LABPROT -- -- -- --  INR -- -- -- --  HEPARINUNFRC 0.39 -- 0.14* <0.10*  CREATININE -- 6.16* -- 8.39*  CKTOTAL -- -- -- --  CKMB -- -- -- --  TROPONINI -- -- -- --   The CrCl is unknown because both a height and weight (above a minimum accepted value) are required for this calculation.     Assessment: 71 yo female s/p cath for STEMI on heparin awaiting PCI next week. Heparin level 0.39 is therapeutic on IV heparin 1450 units/hr.   - H/H and Plts low - No significant bleeding reported  Goal of Therapy:  Heparin level 0.3-0.7 units/ml   Plan:  1. Continue heparin drip at 1450 units/hr (14.5 ml/hr) 2. Follow-up  6hr heparin level to confirm therapeutic, then daily AM heparin level and CBC.  Arman Filter 03/04/2011,11:05 AM

## 2011-03-04 NOTE — Progress Notes (Signed)
Subjective:  Dialyzed yesterday and feels good today.  No more SOB or chest pain.  Objective:  Vital Signs in the last 24 hours: BP 121/62  Pulse 62  Temp(Src) 98.3 F (36.8 C) (Oral)  Resp 18  Wt 88.6 kg (195 lb 5.2 oz)  SpO2 95%  Physical Exam: Obese WF, in NAD Lungs:  Clear to A&P Cardiac:  Regular rhythm, normal S1 and S2, no S3 harsh 3/6 sys murmur of AS Abdomen:  Soft, nontender, no masses Extremities:  Trace edema present  Intake/Output from previous day: 02/02 0701 - 02/03 0700 In: 475.3 [I.V.:475.3] Out: 2528  Filed Weights   03/03/11 1550 03/03/11 1915 03/04/11 0500  Weight: 90 kg (198 lb 6.6 oz) 87.7 kg (193 lb 5.5 oz) 88.6 kg (195 lb 5.2 oz)    Lab Results: Basic Metabolic Panel:  Basename 03/04/11 0635 03/03/11 0625  NA 136 139  K 3.9 4.4  CL 98 98  CO2 27 26  GLUCOSE 209* 95  BUN 31* 53*  CREATININE 6.16* 8.39*    CBC:  Basename 03/04/11 0635 03/03/11 0625  WBC 7.0 6.4  NEUTROABS -- --  HGB 10.5* 10.6*  HCT 32.6* 33.0*  MCV 102.2* 101.9*  PLT 83* 114*    Telemetry: Reviewed :   Sinus rhythm  Assessment/Plan:  1. CAD with LAD disease for PCI next week 2. ESRD on dialysis  3. Ischemic cardiomyopathy 4. Moderate AS  REC:  Keep NPO for poss PCI tomorrow.  Continue heparin.   Darden Palmer.  MD [facility] 03/04/2011, 9:44 AM

## 2011-03-04 NOTE — Progress Notes (Signed)
ANTICOAGULATION CONSULT NOTE - Follow Up Consult  Pharmacy Consult for Heparin Indication: s/p cath for ACS/STEMI; PCI planned for next week  Allergies  Allergen Reactions  . Other Nausea And Vomiting    NARCOTICS    Patient Measurements: Weight: 195 lb 5.2 oz (88.6 kg) Heparin Dosing Weight: 74.7kg  Vital Signs: Temp: 98.9 F (37.2 C) (02/03 1300) Temp src: Oral (02/03 1300) BP: 129/60 mmHg (02/03 1300) Pulse Rate: 65  (02/03 1300)  Labs:  Basename 03/04/11 1712 03/04/11 1023 03/04/11 0635 03/03/11 2010 03/03/11 0625  HGB -- -- 10.5* -- 10.6*  HCT -- -- 32.6* -- 33.0*  PLT -- -- 83* -- 114*  APTT -- -- -- -- --  LABPROT -- -- -- -- --  INR -- -- -- -- --  HEPARINUNFRC 0.32 0.39 -- 0.14* --  CREATININE -- -- 6.16* -- 8.39*  CKTOTAL -- -- -- -- --  CKMB -- -- -- -- --  TROPONINI -- -- -- -- --   The CrCl is unknown because both a height and weight (above a minimum accepted value) are required for this calculation.     Assessment: 71 yo female s/p cath for STEMI on heparin awaiting PCI next week. Heparin level 0.32 remains therapeutic on IV heparin 1450 units/hr.   - H/H and Plts low - No significant bleeding reported  Goal of Therapy:  Heparin level 0.3-0.7 units/ml   Plan:  1. Continue heparin drip at 1450 units/hr (14.5 ml/hr) 2. Daily AM heparin level and CBC.  Arman Filter 03/04/2011,11:05 AM

## 2011-03-04 NOTE — Progress Notes (Signed)
Subjective:  Feeling much better today; denies c/p, SOB, but hasn't been up OOB today; family @ bedside  Vital signs in last 24 hours: Filed Vitals:   03/04/11 0500 03/04/11 0942 03/04/11 0943 03/04/11 1300  BP: 138/67 121/62  129/60  Pulse: 62  62 65  Temp: 98.3 F (36.8 C)   98.9 F (37.2 C)  TempSrc:    Oral  Resp: 18   18  Weight: 88.6 kg (195 lb 5.2 oz)     SpO2: 95%   97%   Weight change: -0.5 kg (-1 lb 1.6 oz)  Intake/Output Summary (Last 24 hours) at 03/04/11 1444 Last data filed at 03/04/11 0700  Gross per 24 hour  Intake 475.32 ml  Output   2528 ml  Net -2052.68 ml   Labs: Basic Metabolic Panel:  Lab 03/04/11 1191 03/03/11 0625  NA 136 139  K 3.9 4.4  CL 98 98  CO2 27 26  GLUCOSE 209* 95  BUN 31* 53*  CREATININE 6.16* 8.39*  CALCIUM 9.2 9.9  ALB -- --  PHOS -- --   Liver Function Tests: No results found for this basename: AST:3,ALT:3,ALKPHOS:3,BILITOT:3,PROT:3,ALBUMIN:3 in the last 168 hours No results found for this basename: LIPASE:3,AMYLASE:3 in the last 168 hours No results found for this basename: AMMONIA:3 in the last 168 hours CBC:  Lab 03/04/11 0635 03/03/11 0625  WBC 7.0 6.4  NEUTROABS -- --  HGB 10.5* 10.6*  HCT 32.6* 33.0*  MCV 102.2* 101.9*  PLT 83* 114*   Cardiac Enzymes: No results found for this basename: CKTOTAL:5,CKMB:5,CKMBINDEX:5,TROPONINI:5 in the last 168 hours CBG:  Lab 03/04/11 1133 03/04/11 0803 03/03/11 2014 03/03/11 1201 03/02/11 1811  GLUCAP 116* 107* 104* 119* 167*    Iron Studies: No results found for this basename: IRON,TIBC,TRANSFERRIN,FERRITIN in the last 72 hours Studies/Results: No results found. Medications:    . heparin 1,450 Units/hr (03/04/11 0008)  . DISCONTD: heparin 1,200 Units/hr (03/03/11 1157)      . amLODipine  5 mg Oral Daily  . aspirin EC  325 mg Oral Daily  . carvedilol  12.5 mg Oral BID  . cinacalcet  30 mg Oral Daily  . cloNIDine  0.3 mg Transdermal Weekly  . clopidogrel  75 mg  Oral Q breakfast  . darbepoetin  25 mcg Intravenous Q Sat-HD  . doxazosin  8 mg Oral QHS  . doxercalciferol  2.5 mcg Intravenous Q T,Th,Sa-HD  . ferric gluconate (FERRLECIT/NULECIT) IV  62.5 mg Intravenous Once  . hydrALAZINE  50 mg Oral BID  . insulin aspart  0-15 Units Subcutaneous TID WC  . insulin aspart  0-5 Units Subcutaneous QHS  . insulin glargine  10 Units Subcutaneous QHS  . mulitivitamin with minerals  1 tablet Oral Daily  . olmesartan  40 mg Oral Daily  . pantoprazole  40 mg Oral Q1200  . rosuvastatin  10 mg Oral QHS  . senna-docusate  1 tablet Oral Daily  . sevelamer  3,200 mg Oral TID WC  . sodium chloride  3 mL Intravenous Q12H  . DISCONTD: darbepoetin  25 mcg Subcutaneous Q Sat-HD  . DISCONTD: simvastatin  40 mg Oral QHS    I  have reviewed scheduled and prn medications.  Physical Exam: General: alert, Ox3, pleasant; comfortable Heart: RRR; 3-4/6 SEM Lungs: diminished with few faint rales scattered bases Abdomen: obese, soft, NT/ND, +BS Extremities: trace BLE edema Dialysis Access: LFA AVG, +T/B  Problem/Plan: 1. NSTEMI/CAD/AS/ischemic cardiomyopathy- s/p heart cath on Friday; per cardiology with plans for  possible PCI tomorrow 2. ESRD - TTS [facility]; next HD Tu after PCI unless warranted sooner per cardiology (history CHFwith EF 30%).  No signs of volume overload.  3. Anemia - Hgb 10.5, on ESA; follow trend 4. Secondary hyperparathyroidism - phos; on binders (Renvela), Sensipar, and Vitamin D; follow ca and phos 5. HTN/volume - controlled; requiring multiple medication regimen (CCB, BB, ARB, Cardura, Clonidine and Hydralazine); Max UF with HD 6. Nutrition - appetite fair, NPO after MN; follow 7. Thrombocytopenia- plt 83; on heparin drip; no heparin with HD; follow 8. DM/HLD- CBG and SSI, Crestor 9. CVA/TIA- on Plavix, ASA 10. Disposition- per primary services  Samuel Germany, FNP-C [facility] Pager [phone]  03/04/2011,2:44 PM  LOS: 2  days   Patient seen and examined and agree with assessment and plan as above with additions in bold.   Vinson Moselle, MD [employer] [phone] cell   [phone] pgr 03/04/2011, 4:17 PM

## 2011-03-05 ENCOUNTER — Other Ambulatory Visit: Payer: Self-pay

## 2011-03-05 ENCOUNTER — Encounter (HOSPITAL_COMMUNITY)
Admission: AD | Disposition: A | Discharge status: Home / Self Care | Payer: Self-pay | Source: Other Acute Inpatient Hospital | Attending: Cardiology

## 2011-03-05 DIAGNOSIS — I214 Non-ST elevation (NSTEMI) myocardial infarction: Secondary | ICD-10-CM

## 2011-03-05 DIAGNOSIS — I2510 Atherosclerotic heart disease of native coronary artery without angina pectoris: Secondary | ICD-10-CM

## 2011-03-05 HISTORY — PX: PERCUTANEOUS CORONARY STENT INTERVENTION (PCI-S): SHX5485

## 2011-03-05 LAB — CBC
Hemoglobin: 10.8 g/dL — ABNORMAL LOW (ref 12.0–15.0)
MCH: 33 pg (ref 26.0–34.0)
MCHC: 32.5 g/dL (ref 30.0–36.0)
MCV: 101.5 fL — ABNORMAL HIGH (ref 78.0–100.0)
Platelets: 90 10*3/uL — ABNORMAL LOW (ref 150–400)

## 2011-03-05 LAB — BASIC METABOLIC PANEL
CO2: 26 mEq/L (ref 19–32)
Calcium: 10 mg/dL (ref 8.4–10.5)
Creatinine, Ser: 7.8 mg/dL — ABNORMAL HIGH (ref 0.50–1.10)
GFR calc non Af Amer: 5 mL/min — ABNORMAL LOW (ref >90)
Glucose, Bld: 149 mg/dL — ABNORMAL HIGH (ref 70–99)
Sodium: 133 mEq/L — ABNORMAL LOW (ref 135–145)

## 2011-03-05 LAB — GLUCOSE, CAPILLARY
Glucose-Capillary: 112 mg/dL — ABNORMAL HIGH (ref 70–99)
Glucose-Capillary: 103 mg/dL — ABNORMAL HIGH (ref 70–99)
Glucose-Capillary: 104 mg/dL — ABNORMAL HIGH (ref 70–99)

## 2011-03-05 LAB — HEPARIN LEVEL (UNFRACTIONATED): Heparin Unfractionated: 0.61 IU/mL (ref 0.30–0.70)

## 2011-03-05 SURGERY — PERCUTANEOUS CORONARY STENT INTERVENTION (PCI-S)
Anesthesia: LOCAL

## 2011-03-05 MED ORDER — ASPIRIN 81 MG PO CHEW
324.0000 mg | CHEWABLE_TABLET | ORAL | Status: AC
  Administered 2011-03-05: 324 mg via ORAL
  Filled 2011-03-05: qty 4

## 2011-03-05 MED ORDER — HEPARIN (PORCINE) IN NACL 2-0.9 UNIT/ML-% IJ SOLN
INTRAMUSCULAR | Status: AC
  Filled 2011-03-05: qty 2000

## 2011-03-05 MED ORDER — ONDANSETRON HCL 4 MG/2ML IJ SOLN: 4.0000 mg | Freq: Four times a day (QID) | INTRAMUSCULAR | Status: DC | PRN

## 2011-03-05 MED ORDER — MIDAZOLAM HCL 2 MG/2ML IJ SOLN
INTRAMUSCULAR | Status: AC
  Filled 2011-03-05: qty 2

## 2011-03-05 MED ORDER — SODIUM CHLORIDE 0.9 % IV SOLN: INTRAVENOUS | Status: DC

## 2011-03-05 MED ORDER — SODIUM CHLORIDE 0.9 % IJ SOLN: 3.0000 mL | INTRAMUSCULAR | Status: DC | PRN

## 2011-03-05 MED ORDER — SODIUM CHLORIDE 0.9 % IV SOLN: 250.0000 mL | INTRAVENOUS | Status: DC | PRN

## 2011-03-05 MED ORDER — LIDOCAINE HCL (PF) 1 % IJ SOLN
INTRAMUSCULAR | Status: AC
  Filled 2011-03-05: qty 30

## 2011-03-05 MED ORDER — SODIUM CHLORIDE 0.9 % IJ SOLN: 3.0000 mL | Freq: Two times a day (BID) | INTRAMUSCULAR | Status: DC

## 2011-03-05 MED ORDER — BIVALIRUDIN 250 MG IV SOLR
INTRAVENOUS | Status: AC
  Filled 2011-03-05: qty 250

## 2011-03-05 MED ORDER — NITROGLYCERIN 0.2 MG/ML ON CALL CATH LAB
INTRAVENOUS | Status: AC
  Filled 2011-03-05: qty 1

## 2011-03-05 NOTE — Progress Notes (Signed)
UR Completed. Simmons, Nivea Wojdyla F [phone]  

## 2011-03-05 NOTE — Progress Notes (Signed)
CARDIAC REHAB PHASE I   PRE:  Rate/Rhythm: 57 SB BP:  Supine:   Sitting: 132/62  Standing:    SaO2: 97% RA  MODE:  Ambulation: 120 ft   POST:  Rate/Rhythem: 59  BP:  Supine:   Sitting: 116/40  Standing:    SaO2: 100% RA  1025 - 1105 Pt ambulated with WC and 2 assist fairly steady, weak. No c/o cp or sob. Back to chair with feet up, call bell in reach.   Rosalie Doctor

## 2011-03-05 NOTE — Progress Notes (Signed)
Subjective: Just out of cath lab.  Had PCI x 2 with drug eluting stents  Objective: Vital signs in last 24 hours: Blood pressure 124/72, pulse 64, temperature 97.7 F (36.5 C), temperature source Oral, resp. rate 18, weight 88.1 kg (194 lb 3.6 oz), SpO2 98.00%.   Intake/Output from previous day: 02/03 0701 - 02/04 0700 In: 588 [I.V.:588] Out: -  Intake/Output this shift:   Wt Readings from Last 3 Encounters:  03/05/11 88.1 kg (194 lb 3.6 oz)  03/05/11 88.1 kg (194 lb 3.6 oz)  03/05/11 88.1 kg (194 lb 3.6 oz)     PHYSICAL EXAM General--awake, akert Chest--clear Heart--no rub Abd--nontender Extr--AVG patent in L forearm, no edema  Lab Results:   Lab 03/05/11 0555 03/04/11 0635 03/03/11 0625  NA 133* 136 139  K 4.2 3.9 4.4  CL 94* 98 98  CO2 26 27 26   BUN 42* 31* 53*  CREATININE 7.80* 6.16* 8.39*  EGFR -- -- --  GLUCOSE 149* -- --  CALCIUM 10.0 9.2 9.9  PHOS -- -- --      Basename 03/05/11 0555 03/04/11 0635  WBC 7.7 7.0  HGB 10.8* 10.5*  HCT 33.2* 32.6*  PLT 90* 83*    No results found for this basename: IRON,TIBC,FERRITIN in the last 72 hours   No results found for this basename: PTH in the last 72 hours   Scheduled:   . amLODipine  5 mg Oral Daily  . aspirin  324 mg Oral Pre-Cath  . aspirin EC  325 mg Oral Daily  . bivalirudin      . carvedilol  12.5 mg Oral BID  . cinacalcet  30 mg Oral Daily  . cloNIDine  0.3 mg Transdermal Weekly  . clopidogrel  75 mg Oral Q breakfast  . darbepoetin  25 mcg Intravenous Q Sat-HD  . doxazosin  8 mg Oral QHS  . doxercalciferol  2.5 mcg Intravenous Q T,Th,Sa-HD  . ferric gluconate (FERRLECIT/NULECIT) IV  62.5 mg Intravenous Once  . heparin      . hydrALAZINE  50 mg Oral BID  . insulin aspart  0-15 Units Subcutaneous TID WC  . insulin aspart  0-5 Units Subcutaneous QHS  . insulin glargine  10 Units Subcutaneous QHS  . lidocaine      . midazolam      . mulitivitamin with minerals  1 tablet Oral Daily  .  nitroGLYCERIN      . olmesartan  40 mg Oral Daily  . pantoprazole  40 mg Oral Q1200  . rosuvastatin  10 mg Oral QHS  . senna-docusate  1 tablet Oral Daily  . sevelamer  3,200 mg Oral TID WC  . sodium chloride  3 mL Intravenous Q12H  . DISCONTD: sodium chloride  3 mL Intravenous Q12H   Continuous:   . heparin 1,450 Units/hr (03/04/11 2003)    Assessment/Plan: 1. NSTEMI/CAD/AS/ischemic cardiomyopathy- PCI with 2 DES--per cardiol  2. ESRD - TTS [facility];  HD Tues. No signs of volume overload.  3. Anemia - Hgb 10.8, on ESA; follow trend  4. Secondary hyperparathyroidism - phos; on binders (Renvela), Sensipar, and Vitamin D; follow ca and phos  5. HTN/volume - controlled; requiring multiple medication regimen (CCB, BB, ARB, Cardura, Clonidine and Hydralazine); Max UF with HD  6. Nutrition - appetite fair, NPO after MN; follow  7. Thrombocytopenia- plt 83; on heparin drip; no heparin with HD; follow  8. DM/HLD- CBG and SSI, Crestor  9. CVA/TIA- on Plavix, ASA  10.  Disposition- per primary services    LOS: 3 days   Rip Hawes F 03/05/2011,4:07 PM

## 2011-03-05 NOTE — Progress Notes (Signed)
Notified Christain Sacramento NP of 2 IV team nurses unable to get new IV or 2nd site for PCI.  No new orders given. Continue to monitor

## 2011-03-05 NOTE — Op Note (Signed)
Cardiac Catheterization Operative Report  Melissa [name] [id number] 2/4/20132:58 PM Donzetta Sprung, MD, MD  Procedure Performed:  1. PTCA/DES x 2 mid LAD  Operator: Verne Carrow, MD  Indication: Pt with severe CAD discovered by diagnostic cath last week.  Transfer from [place] last week with NSTEMI. Moderate AS.                                    Procedure Details: The risks, benefits, complications, treatment options, and expected outcomes were discussed with the patient. The patient and/or family concurred with the proposed plan, giving informed consent. The patient was brought to the cath lab after oral premedication was given. The patient was further sedated with Versed. The right groin was prepped and draped in the usual manner. Using the modified Seldinger access technique, a 6 French sheath was placed in the right femoral artery. We elected to use the right femoral artery because the Allens test was negative on the right radial and she has a dialysis graft in the left arm. A bolus of Angiomax was given and a drip was started. She had been pre-loaded with Plavix. I then engaged the left main with a XB LAD 3.5 guiding catheter. When the ACT was greater than 200, I began to attempt to cross the calcified, 99% mid LAD stenosis. The stenosis was just after a large septal perforator so my wire preferentially selected the septal branch. I then advanced an over the wire balloon to the stenosis and then used a long Whisper wire to cross the stenosis. I could not cross the stenosis with the 2.5 mm balloon. I then used a 1.20 x 12 mm balloon to cross the stenosis and dilated at 14 atm. I then used a 2.5 x 15 mm balloon to pre-dilate the stenosis. A 3.0 x 16 mm Promus Element DES was deployed in the mid vessel. I post-dilated the stent with a 3.25 x 12 mm [place] balloon. After this inflation, there appeared to be a dissection off of the distal edge of the stent. I then placed a 2.75 x 12 mm Promus Element  DES on the distal edge of the stent in the mid vessel. This was post-dilated with a 2.75 x 12 mm [place] balloon which also crossed the overlap. There was excellent flow down the vessel. An Angioseal femoral artery closure device was placed in the right femoral artery. Angiomax was stopped in the cath lab.   There were no immediate complications. The patient was taken to the recovery area in stable condition.    Hemodynamic Findings: Central aortic pressure: 107/40  Impression: 1. Successful PTCA/DES x 2 mid LAD   Recommendations: ASA and Plavix for at least on year. Continue other meds.        Complications:  None; patient tolerated the procedure well.

## 2011-03-05 NOTE — Progress Notes (Signed)
ANTICOAGULATION CONSULT NOTE - Follow Up Consult  Pharmacy Consult for Heparin Indication: s/p cath for ACS/STEMI; PCI planned for today  Allergies  Allergen Reactions  . Other Nausea And Vomiting    NARCOTICS    Patient Measurements: Weight: 194 lb 3.6 oz (88.1 kg) Heparin Dosing Weight: 74.7kg  Vital Signs: Temp: 97.8 F (36.6 C) (02/04 0500) BP: 124/67 mmHg (02/04 0500) Pulse Rate: 63  (02/04 0500)  Labs:  Basename 03/05/11 0555 03/04/11 1712 03/04/11 1023 03/04/11 0635 03/03/11 0625  HGB 10.8* -- -- 10.5* --  HCT 33.2* -- -- 32.6* 33.0*  PLT 90* -- -- 83* 114*  APTT -- -- -- -- --  LABPROT -- -- -- -- --  INR -- -- -- -- --  HEPARINUNFRC 0.61 0.32 0.39 -- --  CREATININE 7.80* -- -- 6.16* 8.39*  CKTOTAL -- -- -- -- --  CKMB -- -- -- -- --  TROPONINI -- -- -- -- --   The CrCl is unknown because both a height and weight (above a minimum accepted value) are required for this calculation.     Assessment: 71 yo female s/p cath for STEMI on heparin awaiting PCI. Heparin level at 0.61 remains therapeutic on IV heparin 1450 units/hr.   - H/H and Plts low - No significant bleeding reported  Goal of Therapy:  Heparin level 0.3-0.7 units/ml   Plan:  1. Continue heparin drip at 1450 units/hr (14.5 ml/hr) 2.  Will follow post cath  Harland German, Pharm D 03/05/2011 8:04 AM

## 2011-03-05 NOTE — Progress Notes (Signed)
Called to start 2nd peripheral iv site; pt going to cath lab later today; current site in right hand is 13 days old with old blood at the site;  Limited to right arm only for access; no other veins seen/felt; no attempts made; will have another IV Team RN assess;  Pt with very poor peripheral access;

## 2011-03-05 NOTE — H&P (View-Only) (Signed)
     Cardiology Progress Note Patient Name: Melissa [name] Date of Encounter: 03/05/2011, 10:33 AM     Subjective  No overnight events. Patient reports feeling well. Denies chest pain or shortness of breath. Had a BM and denies any signs of blood.   Objective   Telemetry: Sinus rhythm vs. 1st degree AV block, 60s, PVCs  Medications: . amLODipine  5 mg Oral Daily  . aspirin  324 mg Oral Pre-Cath  . aspirin EC  325 mg Oral Daily  . carvedilol  12.5 mg Oral BID  . cinacalcet  30 mg Oral Daily  . cloNIDine  0.3 mg Transdermal Weekly  . clopidogrel  75 mg Oral Q breakfast  . darbepoetin  25 mcg Intravenous Q Sat-HD  . doxazosin  8 mg Oral QHS  . doxercalciferol  2.5 mcg Intravenous Q T,Th,Sa-HD  . ferric gluconate (FERRLECIT/NULECIT) IV  62.5 mg Intravenous Once  . hydrALAZINE  50 mg Oral BID  . insulin aspart  0-15 Units Subcutaneous TID WC  . insulin aspart  0-5 Units Subcutaneous QHS  . insulin glargine  10 Units Subcutaneous QHS  . mulitivitamin with minerals  1 tablet Oral Daily  . olmesartan  40 mg Oral Daily  . pantoprazole  40 mg Oral Q1200  . rosuvastatin  10 mg Oral QHS  . senna-docusate  1 tablet Oral Daily  . sevelamer  3,200 mg Oral TID WC   . heparin 1,450 Units/hr (03/04/11 2003)    Physical Exam: Temp:  [97.8 F (36.6 C)-98.9 F (37.2 C)] 97.8 F (36.6 C) (02/04 0500) Pulse Rate:  [61-65] 63  (02/04 0500) Resp:  [18] 18  (02/04 0500) BP: (115-129)/(60-67) 124/67 mmHg (02/04 0500) SpO2:  [96 %-98 %] 98 % (02/04 0500) Weight:  [194 lb 3.6 oz (88.1 kg)] 194 lb 3.6 oz (88.1 kg) (02/04 0500)  General: Overweight, elderly white female in no acute distress. Head: Normocephalic, atraumatic, sclera non-icteric, nares are without discharge.  Neck: Supple. Negative for carotid bruits Lungs: Fine bibasilar rales No wheezes or rhonchi. Breathing is unlabored. Heart: RRR S1 S2. Harsh 3/6 SEM RUSB. No rubs or gallops.  Abdomen: Soft, non-tender, non-distended with  normoactive bowel sounds. No rebound/guarding. No obvious abdominal masses. Msk:  Strength and tone appear normal for age. Extremities: Trace bilat LE edema. No clubbing or cyanosis. Distal pedal pulses are 2+ and equal bilaterally. Neuro: Alert and oriented X 3. Moves all extremities spontaneously. Psych:  Responds to questions appropriately with a normal affect.   Intake/Output Summary (Last 24 hours) at 03/05/11 1033 Last data filed at 03/05/11 0700  Gross per 24 hour  Intake    588 ml  Output      0 ml  Net    588 ml    Labs:  Basename 03/05/11 0555 03/04/11 0635  NA 133* 136  K 4.2 3.9  CL 94* 98  CO2 26 27  GLUCOSE 149* 209*  BUN 42* 31*  CREATININE 7.80* 6.16*  CALCIUM 10.0 9.2   Basename 03/05/11 0555 03/04/11 0635  WBC 7.7 7.0  HGB 10.8* 10.5*  HCT 33.2* 32.6*  MCV 101.5* 102.2*  PLT 90* 83*   Basename 03/03/11 0615  CHOL 127  HDL 27*  LDLCALC 63  TRIG 184*  CHOLHDL 4.7   Radiology/Studies:  No results found.   Assessment and Plan  71 y.o. female who was transferred from [facility] on 03/02/11 after being admitted for acute on chronic diastolic chf and ruling in   for NSTEMI. Lexiscan myoview revealed anterolateral scar, peri-infarct ischemia, and EF 35%. She underwent diagnostic cardiac cath on 03/02/11 with plans for PCI of the LAD this am.  1. NSTEMI with known CAD: No chest pain. Plan for cardiac cath today. Cont ASA, Plavix, Heparin, BB, statin.  2. 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 No signs of volume overload. Next HD tomorrow. 3. Moderate Aortic Stenosis 4. ESRD on HD:  Last dialysis on Saturday (2/2) with plans for next dialysis Tues (2/5). Nephrology following. 5. Anemia/Thrombocytopenia: H&H stable at 10.8/33.2 & Plts stable at 90,000 this am with no signs of active bleeding.  6. Diabetes mellitus: Glucose stable. Cont SSI 7. HLD: LDL 63, cont statin 8. HTN: BP stable on multiple antihypertensives.      Signed, HOPE, JESSICA PA-C  I have seen and examined this pt with Jessica HOpe and agree with assessment and plan. PCI LAD today. cdm  Melissa [name] 1:31 PM 03/05/2011    

## 2011-03-05 NOTE — Progress Notes (Signed)
Cardiology Progress Note Patient Name: Melissa [name] Date of Encounter: 03/05/2011, 10:33 AM     Subjective  No overnight events. Patient reports feeling well. Denies chest pain or shortness of breath. Had a BM and denies any signs of blood.   Objective   Telemetry: Sinus rhythm vs. 1st degree AV block, 60s, PVCs  Medications: . amLODipine  5 mg Oral Daily  . aspirin  324 mg Oral Pre-Cath  . aspirin EC  325 mg Oral Daily  . carvedilol  12.5 mg Oral BID  . cinacalcet  30 mg Oral Daily  . cloNIDine  0.3 mg Transdermal Weekly  . clopidogrel  75 mg Oral Q breakfast  . darbepoetin  25 mcg Intravenous Q Sat-HD  . doxazosin  8 mg Oral QHS  . doxercalciferol  2.5 mcg Intravenous Q T,Th,Sa-HD  . ferric gluconate (FERRLECIT/NULECIT) IV  62.5 mg Intravenous Once  . hydrALAZINE  50 mg Oral BID  . insulin aspart  0-15 Units Subcutaneous TID WC  . insulin aspart  0-5 Units Subcutaneous QHS  . insulin glargine  10 Units Subcutaneous QHS  . mulitivitamin with minerals  1 tablet Oral Daily  . olmesartan  40 mg Oral Daily  . pantoprazole  40 mg Oral Q1200  . rosuvastatin  10 mg Oral QHS  . senna-docusate  1 tablet Oral Daily  . sevelamer  3,200 mg Oral TID WC   . heparin 1,450 Units/hr (03/04/11 2003)    Physical Exam: Temp:  [97.8 F (36.6 C)-98.9 F (37.2 C)] 97.8 F (36.6 C) (02/04 0500) Pulse Rate:  [61-65] 63  (02/04 0500) Resp:  [18] 18  (02/04 0500) BP: (115-129)/(60-67) 124/67 mmHg (02/04 0500) SpO2:  [96 %-98 %] 98 % (02/04 0500) Weight:  [194 lb 3.6 oz (88.1 kg)] 194 lb 3.6 oz (88.1 kg) (02/04 0500)  General: Overweight, elderly white female in no acute distress. Head: Normocephalic, atraumatic, sclera non-icteric, nares are without discharge.  Neck: Supple. Negative for carotid bruits Lungs: Fine bibasilar rales No wheezes or rhonchi. Breathing is unlabored. Heart: RRR S1 S2. Harsh 3/6 SEM RUSB. No rubs or gallops.  Abdomen: Soft, non-tender, non-distended with  normoactive bowel sounds. No rebound/guarding. No obvious abdominal masses. Msk:  Strength and tone appear normal for age. Extremities: Trace bilat LE edema. No clubbing or cyanosis. Distal pedal pulses are 2+ and equal bilaterally. Neuro: Alert and oriented X 3. Moves all extremities spontaneously. Psych:  Responds to questions appropriately with a normal affect.   Intake/Output Summary (Last 24 hours) at 03/05/11 1033 Last data filed at 03/05/11 0700  Gross per 24 hour  Intake    588 ml  Output      0 ml  Net    588 ml    Labs:  [facility] 03/05/11 0555 03/04/11 0635  NA 133* 136  K 4.2 3.9  CL 94* 98  CO2 26 27  GLUCOSE 149* 209*  BUN 42* 31*  CREATININE 7.80* 6.16*  CALCIUM 10.0 9.2   Basename 03/05/11 0555 03/04/11 0635  WBC 7.7 7.0  HGB 10.8* 10.5*  HCT 33.2* 32.6*  MCV 101.5* 102.2*  PLT 90* 83*   Basename 03/03/11 0615  CHOL 127  HDL 27*  LDLCALC 63  TRIG 161*  CHOLHDL 4.7   Radiology/Studies:  No results found.   Assessment and Plan  71 y.o. female who was transferred from [facility] on 03/02/11 after being admitted for acute on chronic diastolic chf and ruling in  for NSTEMI. Lexiscan myoview revealed anterolateral scar, peri-infarct ischemia, and EF 35%. She underwent diagnostic cardiac cath on 03/02/11 with plans for PCI of the LAD this am.  1. NSTEMI with known CAD: No chest pain. Plan for cardiac cath today. Cont ASA, Plavix, Heparin, BB, statin.  2. 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 No signs of volume overload. Next HD tomorrow. 3. Moderate Aortic Stenosis 4. ESRD on HD:  Last dialysis on Saturday (2/2) with plans for next dialysis Tues (2/5). Nephrology following. 5. Anemia/Thrombocytopenia: H&H stable at 10.8/33.2 & Plts stable at 90,000 this am with no signs of active bleeding.  6. Diabetes mellitus: Glucose stable. Cont SSI 7. HLD: LDL 63, cont statin 8. HTN: BP stable on multiple antihypertensives.      Signed, HOPE, JESSICA PA-C  I have seen and examined this pt with Shanda Bumps HOpe and agree with assessment and plan. PCI LAD today. cdm  MCALHANY,CHRISTOPHER 1:31 PM 03/05/2011

## 2011-03-05 NOTE — Interval H&P Note (Signed)
History and Physical Interval Note:  03/05/2011 1:32 PM  Melissa [name]  has presented today for PCI LAD with the diagnosis of CAD  The various methods of treatment have been discussed with the patient and family. After consideration of risks, benefits and other options for treatment, the patient has consented to  Procedure(s): PERCUTANEOUS CORONARY STENT INTERVENTION (PCI-S) as a surgical intervention .  The patients' history has been reviewed, patient examined, no change in status, stable for surgery.  I have reviewed the patients' chart and labs.  Questions were answered to the patient's satisfaction.     [name]

## 2011-03-06 ENCOUNTER — Other Ambulatory Visit: Payer: Self-pay

## 2011-03-06 ENCOUNTER — Inpatient Hospital Stay (HOSPITAL_COMMUNITY): Payer: Medicare Other

## 2011-03-06 DIAGNOSIS — I2510 Atherosclerotic heart disease of native coronary artery without angina pectoris: Secondary | ICD-10-CM

## 2011-03-06 LAB — RENAL FUNCTION PANEL
Albumin: 3.1 g/dL — ABNORMAL LOW (ref 3.5–5.2)
BUN: 58 mg/dL — ABNORMAL HIGH (ref 6–23)
Phosphorus: 6 mg/dL — ABNORMAL HIGH (ref 2.3–4.6)
Potassium: 4.9 mEq/L (ref 3.5–5.1)
Sodium: 132 mEq/L — ABNORMAL LOW (ref 135–145)

## 2011-03-06 LAB — GLUCOSE, CAPILLARY

## 2011-03-06 LAB — CBC
HCT: 31 % — ABNORMAL LOW (ref 36.0–46.0)
Hemoglobin: 10.2 g/dL — ABNORMAL LOW (ref 12.0–15.0)
MCHC: 32.9 g/dL (ref 30.0–36.0)
RBC: 3.04 MIL/uL — ABNORMAL LOW (ref 3.87–5.11)

## 2011-03-06 LAB — POCT ACTIVATED CLOTTING TIME: Activated Clotting Time: 870 seconds

## 2011-03-06 MED ORDER — ASPIRIN EC 81 MG PO TBEC
81.0000 mg | DELAYED_RELEASE_TABLET | Freq: Every day | ORAL | Status: DC
  Administered 2011-03-07: 81 mg via ORAL
  Filled 2011-03-06: qty 1

## 2011-03-06 MED ORDER — DOXERCALCIFEROL 4 MCG/2ML IV SOLN
INTRAVENOUS | Status: AC
  Administered 2011-03-06: 2.5 ug via INTRAVENOUS
  Filled 2011-03-06: qty 2

## 2011-03-06 MED ORDER — ACETAMINOPHEN 325 MG PO TABS
ORAL_TABLET | ORAL | Status: AC
  Administered 2011-03-06: 650 mg via ORAL
  Filled 2011-03-06: qty 2

## 2011-03-06 NOTE — Progress Notes (Signed)
On HD via L forearm AVG BP 107/55 Goal 3L.  Hgb10.2 K pending, renal profile pending Wt Readings from Last 3 Encounters:  03/06/11 89.2 kg (196 lb 10.4 oz)  03/06/11 89.2 kg (196 lb 10.4 oz)  03/06/11 89.2 kg (196 lb 10.4 oz)  She'll go back to [facility] in [place] under the care of Dr. Kristian Covey when discharged

## 2011-03-06 NOTE — Progress Notes (Signed)
CARDIAC REHAB PHASE I   PRE:  Rate/Rhythm: 67 SR    BP: sitting 100/37    SaO2:   MODE:  Ambulation: 66 ft   POST:  Rate/Rhythm: 73 SR    BP: sitting 107/47     SaO2: 95 RA  Pt used RW and assist x1. After 66 ft, pt became weak and c/o sev pain/tightness across back/shoulders. Had to sit. Sts tightness began going away after resting but still felt weak. Rolled pt back to room. Able to walk to chair from bed. Still weak and feeling tightness in back/shoulder but resolving. Left pt in recliner. Son in room. Will f/u am. [id number]  Harriet Masson CES, ACSM

## 2011-03-06 NOTE — Progress Notes (Addendum)
Patient Name: Melissa [name] Date of Encounter: 03/06/2011     Principal Problem:  *NSTEMI (non-ST elevated myocardial infarction) Active Problems:  Coronary artery disease  Hyperlipidemia  Hypertension  Chronic kidney disease  CHF (congestive heart failure)    SUBJECTIVE  No chest pain or sob.  Finished dialysis this am without difficulty.   CURRENT MEDS    . amLODipine  5 mg Oral Daily  . aspirin EC  325 mg Oral Daily  . bivalirudin      . carvedilol  12.5 mg Oral BID  . cinacalcet  30 mg Oral Daily  . cloNIDine  0.3 mg Transdermal Weekly  . clopidogrel  75 mg Oral Q breakfast  . darbepoetin  25 mcg Intravenous Q Sat-HD  . doxazosin  8 mg Oral QHS  . doxercalciferol  2.5 mcg Intravenous Q T,Th,Sa-HD  . ferric gluconate (FERRLECIT/NULECIT) IV  62.5 mg Intravenous Once  . heparin      . hydrALAZINE  50 mg Oral BID  . insulin aspart  0-15 Units Subcutaneous TID WC  . insulin aspart  0-5 Units Subcutaneous QHS  . insulin glargine  10 Units Subcutaneous QHS  . lidocaine      . midazolam      . mulitivitamin with minerals  1 tablet Oral Daily  . nitroGLYCERIN      . olmesartan  40 mg Oral Daily  . pantoprazole  40 mg Oral Q1200  . rosuvastatin  10 mg Oral QHS  . senna-docusate  1 tablet Oral Daily  . sevelamer  3,200 mg Oral TID WC    OBJECTIVE  Filed Vitals:   03/06/11 0930 03/06/11 0956 03/06/11 1020 03/06/11 1048  BP: 123/52 106/33 106/43 113/41  Pulse: 72 61 63 70  Temp:   97.2 F (36.2 C) 98.4 F (36.9 C)  TempSrc:   Oral Oral  Resp: 18 18 16 16   Weight:   194 lb 3.6 oz (88.1 kg)   SpO2:   96% 95%    Intake/Output Summary (Last 24 hours) at 03/06/11 1134 Last data filed at 03/06/11 1020  Gross per 24 hour  Intake    240 ml  Output   1044 ml  Net   -804 ml   Filed Weights   03/06/11 0622 03/06/11 0716 03/06/11 1020  Weight: 197 lb 5 oz (89.5 kg) 196 lb 10.4 oz (89.2 kg) 194 lb 3.6 oz (88.1 kg)    PHYSICAL EXAM  General: Pleasant,  NAD. Neuro: Alert and oriented X 3. Moves all extremities spontaneously. Psych: Normal affect. HEENT:  Normal  Neck: Supple without bruits or JVD. Lungs:  Resp regular and unlabored, few basilar crackles. Heart: RRR no s3, s4.  3/6 SEM heard throughout - loudest @ the RUSB. Abdomen: Soft, non-tender, non-distended, BS + x 4.  Extremities: No clubbing, cyanosis.  Trace bilat LEE. DP/PT/Radials 2+ and equal bilaterally.  Right groin w/o bleeding/bruit/hematoma.  DP 1+.  Accessory Clinical Findings  CBC  Basename 03/06/11 0718 03/05/11 0555  WBC 5.6 7.7  NEUTROABS -- --  HGB 10.2* 10.8*  HCT 31.0* 33.2*  MCV 102.0* 101.5*  PLT 90* 90*   Basic Metabolic Panel  Basename 03/06/11 0718 03/05/11 0555  NA 132* 133*  K 4.9 4.2  CL 94* 94*  CO2 24 26  GLUCOSE 96 149*  BUN 58* 42*  CREATININE 9.54* 7.80*  CALCIUM 9.8 10.0  MG -- --  PHOS 6.0* --   Liver Function Tests  [facility] 03/06/11 1610  AST --  ALT --  ALKPHOS --  BILITOT --  PROT --  ALBUMIN 3.1*    TELE  rsr  Radiology/Studies  No results found.  ASSESSMENT AND PLAN  1.  NSTEMI/CAD:  S/p cath and subsequent PCI/DES to LAD on 2/4.  No further chest pain.  Seen by rehab yesterday and was able to ambulate with some assistance.  Cont asa (decreased to 81mg ), bb, statin, plavix.  2.  ESRD:  S/p dialysis.  Appreciate renal assistance.  Will go back to [facility] in [place] under Dr. Kristian Covey for Uland[facility] Edison, Sat sessions.  3.  ICM/Acute Systolic CHF:  Pt had EF of 50-55% on echo @ [facility] this admission.  EF of 35% by SPECT and 30% by V-gram here.  S/p dialysis.  Volume difficult to assess 2/2 obesity, though weights have been trending down.  Cont bb, acei.  Need to stress importance of not missing dialysis.  4.  Deconditioning:  Pt reports feeling weak.  Says she has trouble getting out of be at home.  Lives by herself.  Will ask PT to see while she is here.  Likely would benefit from HHPT/outpt PT.  5.  Anemia of Chronic  Dzs:  S/p 2 units prbc's @ [facility].  H/H down slightly but overall stable.  6.  Mod. AS:  F/u as outpt.  7.  DM:  Cont current regimen.  8.  HL:  LDL 63.  Cont statin.  Signed, Nicolasa Ducking NP Patient seen and examined. I agree with the assessment and plan as detailed above. See also my additional thoughts below.  I've examined the patient today. Have also spoken with the patient's son in the room. I reviewed all the information with Mr. Brion Aliment. Patient is doing well. We will get PT to evaluate her and arrange for home PT in addition. It should be ready to go home tomorrow   Willa Rough, MD, [facility] 03/06/2011 12:04 PM

## 2011-03-07 DIAGNOSIS — I200 Unstable angina: Secondary | ICD-10-CM

## 2011-03-07 LAB — RENAL FUNCTION PANEL
BUN: 27 mg/dL — ABNORMAL HIGH (ref 6–23)
Calcium: 9.6 mg/dL (ref 8.4–10.5)
GFR calc Af Amer: 8 mL/min — ABNORMAL LOW (ref >90)
Glucose, Bld: 103 mg/dL — ABNORMAL HIGH (ref 70–99)
Phosphorus: 4.3 mg/dL (ref 2.3–4.6)
Sodium: 150 mEq/L — ABNORMAL HIGH (ref 135–145)

## 2011-03-07 LAB — GLUCOSE, CAPILLARY: Glucose-Capillary: 113 mg/dL — ABNORMAL HIGH (ref 70–99)

## 2011-03-07 MED ORDER — FUROSEMIDE 40 MG PO TABS: 40.0000 mg | ORAL_TABLET | Freq: Every day | ORAL | Status: DC

## 2011-03-07 MED ORDER — NITROGLYCERIN 0.4 MG SL SUBL: 0.4000 mg | SUBLINGUAL_TABLET | SUBLINGUAL | Status: DC | PRN

## 2011-03-07 NOTE — Progress Notes (Signed)
I have seen and examined the patient. I agree with the above note: No further chest pain. Can discharge home. Cardiac rehab at [facility] if possible. DAPT for at least 1 year. Monitor aortic stenosis with echo.   Lorine Bears 03/07/2011 2:12 PM

## 2011-03-07 NOTE — Progress Notes (Signed)
Physical Therapy Evaluation Patient Details Name: Melissa [name] MRN: [id number] DOB: Jun 08, 1940 Today's Date: 03/07/2011  Problem List:  Patient Active Problem List  Diagnoses  . DIAB W/NEURO MANIFESTS TYPE II/UNS NOT UNCNTRL  . ANEMIA IN CHRONIC KIDNEY DISEASE  . ACUTE RESPIRATORY FAILURE  . PERSONAL HX TIA & CI W/O RESIDUAL DEFICITS  . Coronary artery disease  . Hyperlipidemia  . Aortic stenosis  . Dysfunction parathyroid  . Hypertension  . Anemia  . CVA (cerebral vascular accident)  . Chronic kidney disease  . Carotid artery disease  . Edema  . CHF (congestive heart failure)  . NSTEMI (non-ST elevated myocardial infarction)    Past Medical History:  Past Medical History  Diagnosis Date  . Coronary artery disease     catheterization March 2011.(presentation wtih mild CHF and mild increased troponin))..70% proximal/80% mid LAD,50% circumflex.Marland KitchenLAD could be approached but with difficulty....medicall therapy recommended/NSTEMI 01/31/2010 with sepsis and anemia...EF 50%...medical Rx  . Diabetes mellitus   . Hyperlipidemia   . Aortic stenosis     mild to moderate...echo.Marland KitchenMarland KitchenSeptember,2006/catheterization valve area 1.9cm square/mod/severe....echo...10/2009/mod/severe....echo....01/31/2010  . 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 Hypertension   . Anemia   . CVA (cerebral vascular accident)   . Chronic kidney disease     ESRD...dialysis  . Carotid artery disease     doppler 2011 /  doppler 05/08/2010 similar, <50% bilateral with some plaque  . Edema    Past Surgical History:  Past Surgical History  Procedure Date  . Appendectomy   . Oophorectomy   . Toe amputation     left great toe  . Retinal detachment surgery   . Cataract extraction     PT Assessment/Plan/Recommendation PT Assessment Clinical Impression Statement: pt presents with NSTEMI.  pt needs encouragement for mobility and seems like this may be near her baseline.  pt  notes she does as little walking as she can at home.   PT Recommendation/Assessment: Patent does not need any further PT services No Skilled PT: Patient at baseline level of functioning;Patient will have necessary level of assist by caregiver at discharge PT Recommendation Follow Up Recommendations: Home health PT Equipment Recommended: None recommended by PT PT Goals     PT Evaluation Precautions/Restrictions  Precautions Precautions: Fall Required Braces or Orthoses: No Restrictions Weight Bearing Restrictions: No Prior Functioning  Home Living Lives With: Alone Receives Help From: Other (Comment) (H[facility] daily 1-5 for cleaning/housework.  ) Type of Home: House Home Layout: One level Home Access: Ramped entrance Bathroom Shower/Tub: Health visitor: Handicapped height Bathroom Accessibility: No How Accessible: Other (comment) (Not able to fit RW) Home Adaptive Equipment: Shower chair with back;Walker - rolling;Straight cane;Bedside commode/3-in-1 Prior Function Level of Independence: Independent with basic ADLs;Independent with gait;Independent with transfers;Needs assistance with homemaking Laundry: Total Vacuuming: Total Light Housekeeping: Total Able to Take Stairs?: No Driving: Yes Vocation: Retired Comments: Son and daughter will be staying with pt. at night for the next few days Cognition   Sensation/Coordination   Extremity Assessment RUE Assessment RUE Assessment: Within Functional Limits (~90degrees shoulder) LUE Assessment LUE Assessment: Within Functional Limits RLE Assessment RLE Assessment: Within Functional Limits LLE Assessment LLE Assessment: Within Functional Limits Mobility (including Balance) Bed Mobility Bed Mobility: Yes Supine to Sit: 5: Supervision;With rails Supine to Sit Details (indicate cue type and reason): cues for sequencing.   Transfers Transfers: Yes Sit to Stand: 4: Min assist;With upper extremity assist;From  bed Sit to Stand Details (indicate cue  type and reason): cues for use of UEs Stand to Sit: 5: Supervision;With upper extremity assist;With armrests;To chair/3-in-1 Stand to Sit Details: cues to get closer to recliner Ambulation/Gait Ambulation/Gait: Yes Ambulation/Gait Assistance: 5: Supervision Ambulation/Gait Assistance Details (indicate cue type and reason): cues for upright posture Ambulation Distance (Feet): 200 Feet Assistive device: Rolling walker Gait Pattern: Step-through pattern;Decreased stride length;Trunk flexed Stairs: No Wheelchair Mobility Wheelchair Mobility: No  Posture/Postural Control Posture/Postural Control: No significant limitations Balance Balance Assessed: No Exercise    End of Session PT - End of Session Equipment Utilized During Treatment: Gait belt Activity Tolerance: Patient tolerated treatment well Patient left: in chair;with call bell in reach Nurse Communication: Mobility status for transfers;Mobility status for ambulation General Behavior During Session: [facility] for tasks performed Cognition: [facility] for tasks performed  Sunny Schlein, [place] [phone] 03/07/2011, 10:51 AM

## 2011-03-07 NOTE — Discharge Summary (Signed)
CARDIOLOGY DISCHARGE SUMMARY   Patient ID: [name] MRN: [id number] DOB/AGE: 1940/10/16 71 y.o.  Admit date: 03/02/2011 Discharge date: 03/07/2011  Primary Discharge Diagnosis:  USAP Secondary Discharge Diagnosis:  Patient Active Problem List  Diagnoses  . DIAB W/NEURO MANIFESTS TYPE II/UNS NOT UNCNTRL  . ANEMIA IN CHRONIC KIDNEY DISEASE  . ACUTE RESPIRATORY FAILURE  . PERSONAL HX TIA & CI W/O RESIDUAL DEFICITS  . Coronary artery disease  . Hyperlipidemia  . Aortic stenosis  . Dysfunction parathyroid  . Hypertension  . Anemia  . CVA (cerebral vascular accident)  . Chronic kidney disease  . Carotid artery disease  . Edema  . CHF (congestive heart failure)  . NSTEMI (non-ST elevated myocardial infarction)   Procedures: PTCA/DES x 2 mid LAD, cardiac catheterization, right heart catheterization, left ventriculogram   Hospital Course: [name] is a 71 year old female who went to [facility] hospital after missing dialysis because of the weather. She had shortness of breath but ruled in for non-ST segment elevation MI. Her EF was down on echocardiogram and a Myoview showed peri-infarct ischemia with a scar. She was transferred to [facility] time for cath. She had an occlusion of the LAD as well as other nonobstructive disease. Dr. addition recommended staged percutaneous intervention. The renal team was consulted to manage her dialysis.    On 03/06/2011, she had a recatheterization with a 3.0 x 16 mm Promus Element DES deployed in the mid LAD, since there appeared to be a dissection off of the distal edge of the stent, a 2.75 x 12 mm Promus Element DES crossed the overlap. A good result was obtained. She was seen by cardiac rehabilitation. An echocardiogram done at [facility] showed moderate aortic stenosis and this will be followed in [place]. On 03/07/2011, Ms. [name] was seen by Dr. Renato Gails. She ambulated with cardiac rehabilitation and was seen by physical therapy and occupational  therapy as well. Home health PT is recommended. Ms. [name] is otherwise considered stable for discharge, to followup as an outpatient.   Labs:   Lab Results  Component Value Date   WBC 5.6 03/06/2011   HGB 10.2* 03/06/2011   HCT 31.0* 03/06/2011   MCV 102.0* 03/06/2011   PLT 90* 03/06/2011    Lab 03/07/11 0407  NA 150*  K 4.3  CL 91*  CO2 30  BUN 27*  CREATININE 5.76*  CALCIUM 9.6  PROT --  BILITOT --  ALKPHOS --  ALT --  AST --  GLUCOSE 103*   No results found for this basename: CKTOTAL:3,CKMB:3,CKMBINDEX:3,TROPONINI:3 in the last 72 hours Lipid Panel     Component Value Date/Time   CHOL 127 03/03/2011 0615   TRIG 184* 03/03/2011 0615   HDL 27* 03/03/2011 0615   CHOLHDL 4.7 03/03/2011 0615   VLDL 37 03/03/2011 0615   LDLCALC 63 03/03/2011 0615    No results found for this basename: probnp   No results found for this basename: INR in the last 72 hours     EKG: 06-Mar-2011 05:51:49 Sinus bradycardia with 1st degree A-V block Septal infarct , age undetermined T wave abnormality, consider inferolateral ischemia Abnormal ECG No significant change since last tracing  FOLLOW UP PLANS AND APPOINTMENTS Discharge Orders    Future Appointments: Provider: Department: Dept Phone: Center:   03/19/2011 9:45 AM Luis Abed, MD [facility] [phone] [facility]   10/19/2011 9:15 AM Sherrie George, MD [facility] [phone] None     Future Orders Please Complete By Expires  Diet Carb Modified      Diet - low sodium heart healthy      Increase activity slowly      (HEART FAILURE PATIENTS) Call MD:  Anytime you have any of the following symptoms: 1) 3 pound weight gain in 24 hours or 5 pounds in 1 week 2) shortness of breath, with or without a dry hacking cough 3) swelling in the hands, feet or stomach 4) if you have to sleep on extra pillows at night in order to breathe.        Medication List  As of 03/07/2011  2:24 PM   Take this medication        furosemide 40 MG  tablet         TAKE these medications         amLODipine 5 MG tablet   Commonly known as: NORVASC   Take 5 mg by mouth daily.      aspirin EC 81 MG tablet   Take 81 mg by mouth daily.      carvedilol 12.5 MG tablet   Commonly known as: COREG   Take 1 tablet (12.5 mg total) by mouth 2 (two) times daily.      cinacalcet 30 MG tablet   Commonly known as: SENSIPAR   Take 30 mg by mouth daily.      cloNIDine 0.3 mg/24hr   Commonly known as: CATAPRES - Dosed in mg/24 hr   Place 1 patch onto the skin once a week.      clopidogrel 75 MG tablet   Commonly known as: PLAVIX   Take 75 mg by mouth daily.      doxazosin 8 MG tablet   Commonly known as: CARDURA   Take 8 mg by mouth at bedtime.      folic acid-vitamin b complex-vitamin c-selenium-zinc 3 MG Tabs   Take 1 tablet by mouth daily.      hydrALAZINE 50 MG tablet   Commonly known as: APRESOLINE   Take 50 mg by mouth 2 (two) times daily.      insulin glargine 100 UNIT/ML injection   Commonly known as: LANTUS   Inject 10 Units into the skin at bedtime. Patient does not take if blood sugar is 100 or below.      insulin glulisine 100 UNIT/ML injection   Commonly known as: APIDRA   Inject 5-15 Units into the skin 3 (three) times daily before meals. Sliding scale      olmesartan 40 MG tablet   Commonly known as: BENICAR   Take 40 mg by mouth daily.      omeprazole 20 MG capsule   Commonly known as: PRILOSEC   Take 20 mg by mouth daily.      polyethylene glycol powder powder   Commonly known as: GLYCOLAX/MIRALAX   Take 68 g by mouth 3 (three) times a week. Tuesday, Thursday, Sunday      sevelamer 800 MG tablet   Commonly known as: RENAGEL   Take 1,600-3,200 mg by mouth See admin instructions. Takes 3200 mg with meals and 1600mg  with each snack      simvastatin 40 MG tablet   Commonly known as: ZOCOR   Take 40 mg by mouth at bedtime.      sodium polystyrene 15 GM/60ML suspension   Commonly known as: KAYEXALATE    Take 15 g by mouth 2 (two) times a week. Patient takes 15 g on Saturday and Sunday.    NEW - Nitro  0.4 mg SL, PRN       Follow-up Information    Follow up with [facility]. (Dr Willa Rough, February 18th, 9:45 am)    Contact information:   [address] [place] [address]          BRING ALL MEDICATIONS WITH YOU TO FOLLOW UP APPOINTMENTS  Time spent with patient to include physician time: 42 min Signed: Theodore Demark 03/07/2011, 2:24 PM Co-Sign MD

## 2011-03-07 NOTE — Evaluation (Signed)
Occupational Therapy Evaluation Patient Details Name: Melissa [name] MRN: [id number] DOB: [birth year] Today's Date: 03/07/2011  Problem List:  Patient Active Problem List  Diagnoses  . DIAB W/NEURO MANIFESTS TYPE II/UNS NOT UNCNTRL  . ANEMIA IN CHRONIC KIDNEY DISEASE  . ACUTE RESPIRATORY FAILURE  . PERSONAL HX TIA & CI W/O RESIDUAL DEFICITS  . Coronary artery disease  . Hyperlipidemia  . Aortic stenosis  . Dysfunction parathyroid  . Hypertension  . Anemia  . CVA (cerebral vascular accident)  . Chronic kidney disease  . Carotid artery disease  . Edema  . CHF (congestive heart failure)  . NSTEMI (non-ST elevated myocardial infarction)    Past Medical History:  Past Medical History  Diagnosis Date  . Coronary artery disease     catheterization March 2011.(presentation wtih mild CHF and mild increased troponin))..70% proximal/80% mid LAD,50% circumflex.Marland KitchenLAD could be approached but with difficulty....medicall therapy recommended/NSTEMI 01/31/2010 with sepsis and anemia...EF 50%...medical Rx  . Diabetes mellitus   . Hyperlipidemia   . Aortic stenosis     mild to moderate...echo.Marland KitchenMarland KitchenSeptember,2006/catheterization valve area 1.9cm square/mod/severe....echo...10/2009/mod/severe....echo....01/31/2010  . 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 Hypertension   . Anemia   . CVA (cerebral vascular accident)   . Chronic kidney disease     ESRD...dialysis  . Carotid artery disease     doppler 2011 /  doppler 05/08/2010 similar, <50% bilateral with some plaque  . Edema    Past Surgical History:  Past Surgical History  Procedure Date  . Appendectomy   . Oophorectomy   . Toe amputation     left great toe  . Retinal detachment surgery   . Cataract extraction     OT Assessment/Plan/Recommendation OT Assessment Clinical Impression Statement: Pt. presents with NSTEMI and all education completed. Recommend D/C home with family support and HH. OT  Recommendation/Assessment: Patient does not need any further OT services OT Recommendation Follow Up Recommendations: Home health OT;Supervision - Intermittent Equipment Recommended: None recommended by OT OT Goals  None  OT Evaluation Precautions/Restrictions  Precautions Precautions: Fall Required Braces or Orthoses: No Restrictions Weight Bearing Restrictions: No Prior Functioning Home Living Lives With: Alone Receives Help From: Other (Comment) ([facility] daily 1-5 for cleaning/housework.  ) Type of Home: House Home Layout: One level Home Access: Ramped entrance Bathroom Shower/Tub: Health visitor: Handicapped height Bathroom Accessibility: No How Accessible: Other (comment) (Not able to fit RW) Home Adaptive Equipment: Shower chair with back;Walker - rolling;Straight cane;Bedside commode/3-in-1 Prior Function Level of Independence: Independent with basic ADLs;Independent with gait;Independent with transfers;Needs assistance with homemaking Laundry: Total Vacuuming: Total Light Housekeeping: Total Able to Take Stairs?: No Driving: Yes Vocation: Retired Comments: Son and daughter will be staying with pt. at night for the next few days ADL ADL Eating/Feeding: Performed;Independent Where Assessed - Eating/Feeding: Chair Grooming: Performed;Wash/dry hands;Wash/dry face;Set up;Minimal assistance Grooming Details (indicate cue type and reason): Min verbal cues for safety with RW and min assist due to decreased balance. Pt. with 1 LOB posteriorly while using bil UE to wash face Where Assessed - Grooming: Standing at sink Upper Body Bathing: Simulated;Chest;Right arm;Left arm;Use of adaptive equipment;Set up Where Assessed - Upper Body Bathing: Sitting, bed Lower Body Bathing: Simulated;Set up Where Assessed - Lower Body Bathing: Sit to stand from bed Upper Body Dressing: Performed;Set up Upper Body Dressing Details (indicate cue type and reason): With donning  gown Where Assessed - Upper Body Dressing: Sitting, bed Lower Body Dressing: Set up;Performed Lower Body Dressing  Details (indicate cue type and reason): With crossing foot over opposite knee to don socks Toilet Transfer: Buyer, retail Details (indicate cue type and reason): Min verbal cues for hand placement and RW safety Toilet Transfer Method: Ambulating Toileting - Clothing Manipulation: Simulated;Set up Where Assessed - Toileting Clothing Manipulation: Standing Toileting - Hygiene: Simulated;Set up Where Assessed - Toileting Hygiene: Standing Tub/Shower Transfer: Not assessed Tub/Shower Transfer Method: Not assessed Equipment Used: Rolling walker Ambulation Related to ADLs: Pt. provided min guard assist-min assist with RW ~150 ADL Comments: Pt. educated on ADL techniques with energy conservation tactics to increase activity tolerance  Vision/Perception  Vision - History Baseline Vision: Wears glasses all the time Patient Visual Report: No change from baseline Vision - Assessment Eye Alignment: Within Functional Limits Vision Assessment: Vision not tested    Extremity Assessment RUE Assessment RUE Assessment: Within Functional Limits (~90degrees shoulder) LUE Assessment LUE Assessment: Within Functional Limits Mobility  Bed Mobility Bed Mobility: Yes Supine to Sit: 5: Supervision;With rails Supine to Sit Details (indicate cue type and reason): cues for sequencing.   Transfers Transfers: Yes Sit to Stand: 4: Min assist;With upper extremity assist;From bed Sit to Stand Details (indicate cue type and reason): cues for use of UEs Stand to Sit: 5: Supervision;With upper extremity assist;With armrests;To chair/3-in-1 Stand to Sit Details: cues to get closer to recliner    End of Session OT - End of Session Equipment Utilized During Treatment: Gait belt Activity Tolerance: Patient tolerated treatment well Patient left: in chair;with call bell  in reach Nurse Communication: Mobility status for transfers General Behavior During Session: [facility] for tasks performed Cognition: [facility] for tasks performed   Josemaria Brining, OTR/L Pager [phone] 03/07/2011, 11:27 AM

## 2011-03-07 NOTE — Progress Notes (Signed)
Subjective: Cath/PCI on 4 Feb.  HD yesterday.  She says she's to go home today.  Dialysis in [facility] tomorrow (Dr. Kristian Covey).   EDW 92 kg as outpatient.  EDW now probably 89-90 kg with clothes on Objective: Vital signs in last 24 hours: Blood pressure 119/92, pulse 60, temperature 98.1 F (36.7 C), temperature source Oral, resp. rate 16, weight 89.3 kg (196 lb 13.9 oz), SpO2 91.00%.   Intake/Output from previous day: 02/05 0701 - 02/06 0700 In: 240 [P.O.:240] Out: 1044  Intake/Output this shift:   Wt Readings from Last 3 Encounters:  03/07/11 89.3 kg (196 lb 13.9 oz)  03/07/11 89.3 kg (196 lb 13.9 oz)  03/07/11 89.3 kg (196 lb 13.9 oz)     PHYSICAL EXAM General--awake, alert, friendly Chest--clear Heart--no rub Abd--nontender Extr--no edema, AVG patent L forearm  Lab Results:   Lab 03/07/11 0407 03/06/11 0718 03/05/11 0555  NA 150* 132* 133*  K 4.3 4.9 4.2  CL 91* 94* 94*  CO2 30 24 26   BUN 27* 58* 42*  CREATININE 5.76* 9.54* 7.80*  EGFR -- -- --  GLUCOSE 103* -- --  CALCIUM 9.6 9.8 10.0  PHOS 4.3 6.0* --         Scheduled:   . amLODipine  5 mg Oral Daily  . aspirin EC  81 mg Oral Daily  . carvedilol  12.5 mg Oral BID  . cinacalcet  30 mg Oral Daily  . cloNIDine  0.3 mg Transdermal Weekly  . clopidogrel  75 mg Oral Q breakfast  . darbepoetin  25 mcg Intravenous Q Sat-HD  . doxazosin  8 mg Oral QHS  . doxercalciferol  2.5 mcg Intravenous Q T,Th,Sa-HD  . hydrALAZINE  50 mg Oral BID  . insulin aspart  0-15 Units Subcutaneous TID WC  . insulin aspart  0-5 Units Subcutaneous QHS  . insulin glargine  10 Units Subcutaneous QHS  . mulitivitamin with minerals  1 tablet Oral Daily  . olmesartan  40 mg Oral Daily  . pantoprazole  40 mg Oral Q1200  . rosuvastatin  10 mg Oral QHS  . senna-docusate  1 tablet Oral Daily  . sevelamer  3,200 mg Oral TID WC  . sodium chloride  3 mL Intravenous Q12H  . DISCONTD: aspirin EC  325 mg Oral Daily   Continuous:   .  sodium chloride      Assessment/Plan:  1. NSTEMI/CAD/AS/ischemic cardiomyopathy- PCI with 2 DES--per cardiol  2. ESRD - TTS [facility]; HD Tues. No signs of volume overload. New EDW ~90 kg 3. Anemia - Hgb 10.8, on ESA; follow trend  4. Secondary hyperparathyroidism - phos; on binders (Renvela), Sensipar, and Vitamin D; follow ca and phos  5. HTN/volume - controlled; requiring multiple medication regimen (CCB, BB, ARB, Cardura, Clonidine and Hydralazine); Max UF with HD  6. Nutrition - appetite fair,  On renal diet  7. Thrombocytopenia- plt 83; on heparin drip; no heparin with HD; follow  8. DM/HLD- CBG and SSI, Crestor  9. CVA/TIA- on Plavix, ASA  10. Disposition- per primary services   Supposed to be d/c today.  I'll ask our PA to communicate with [facility] dialysis unit and to make sure that Zenaida Niece picks her up for HD tomorrow.     LOS: 5 days   Kessie Croston F 03/07/2011,10:36 AM

## 2011-03-07 NOTE — Progress Notes (Signed)
Patient Name: Melissa [name] Date of Encounter: 03/07/2011  Principal Problem:  *NSTEMI (non-ST elevated myocardial infarction) Active Problems:  Coronary artery disease  Hyperlipidemia  Hypertension  Chronic kidney disease  CHF (congestive heart failure)    SUBJECTIVE:Pt feels well. No CP/SOB overnight. Had problems with ambulation yesterday because walker was too short and she ambulated after HD. In general, she is too tired to do anything after that.  OBJECTIVE  Filed Vitals:   03/06/11 2214 03/06/11 2215 03/07/11 0033 03/07/11 0426  BP: 124/43  130/40 117/92  Pulse:  62 58 61  Temp:   98.2 F (36.8 C) 98.3 F (36.8 C)  TempSrc:   Oral Oral  Resp:   14 16  Weight:   196 lb 13.9 oz (89.3 kg)   SpO2:   93% 91%    Intake/Output Summary (Last 24 hours) at 03/07/11 0653 Last data filed at 03/07/11 0400  Gross per 24 hour  Intake    240 ml  Output   1044 ml  Net   -804 ml   Weight change: -10.6 oz (-0.3 kg) Wt Readings from Last 3 Encounters:  03/07/11 196 lb 13.9 oz (89.3 kg)  03/07/11 196 lb 13.9 oz (89.3 kg)  03/07/11 196 lb 13.9 oz (89.3 kg)     PHYSICAL EXAM  General: Well developed, well nourished, female in no acute distress. Head: Normocephalic, atraumatic.  Neck: Supple without bruits, JVD at 10 cm. Lungs:  Resp regular and unlabored, bibasilar crackles. Heart: RRR, S1, S2, no S3, S4, 2/6 murmur. Abdomen: Soft, non-tender, non-distended, BS + x 4.  Extremities: No clubbing, cyanosis, trace edema.  Neuro: Alert and oriented X 3. Moves all extremities spontaneously. Psych: Normal affect.  LABS:  CBC: Basename 03/06/11 0718 03/05/11 0555  WBC 5.6 7.7  NEUTROABS -- --  HGB 10.2* 10.8*  HCT 31.0* 33.2*  MCV 102.0* 101.5*  PLT 90* 90*   Basic Metabolic Panel: Basename 03/07/11 0407 03/06/11 0718  NA 150* 132*  K 4.3 4.9  CL 91* 94*  CO2 30 24  GLUCOSE 103* 96  BUN 27* 58*  CREATININE 5.76* 9.54*  CALCIUM 9.6 9.8  MG -- --  PHOS 4.3 6.0*     Liver Function Tests: Basename 03/07/11 0407 03/06/11 0718  AST -- --  ALT -- --  ALKPHOS -- --  BILITOT -- --  PROT -- --  ALBUMIN 3.1* 3.1*   TELE:  SR, occ PVCs  Radiology/Studies: No results found.  Current Medications:    . amLODipine  5 mg Oral Daily  . aspirin EC  81 mg Oral Daily  . carvedilol  12.5 mg Oral BID  . cinacalcet  30 mg Oral Daily  . cloNIDine  0.3 mg Transdermal Weekly  . clopidogrel  75 mg Oral Q breakfast  . darbepoetin  25 mcg Intravenous Q Sat-HD  . doxazosin  8 mg Oral QHS  . doxercalciferol  2.5 mcg Intravenous Q T,Th,Sa-HD  . ferric gluconate (FERRLECIT/NULECIT) IV  62.5 mg Intravenous Once  . hydrALAZINE  50 mg Oral BID  . insulin aspart  0-15 Units Subcutaneous TID WC  . insulin aspart  0-5 Units Subcutaneous QHS  . insulin glargine  10 Units Subcutaneous QHS  . mulitivitamin with minerals  1 tablet Oral Daily  . olmesartan  40 mg Oral Daily  . pantoprazole  40 mg Oral Q1200  . rosuvastatin  10 mg Oral QHS  . senna-docusate  1 tablet Oral Daily  . sevelamer  3,200 mg Oral TID WC  . sodium chloride  3 mL Intravenous Q12H    ASSESSMENT AND PLAN: Principal Problem:  *NSTEMI (non-ST elevated myocardial infarction) - S/P 3.0 x 16 mm Promus Element DES was deployed in the mid LAD, there appeared to be a dissection off of the distal edge of the stent so a 2.75 x 12 mm Promus Element DES crossed the overlap. On ASA/Plavix, BB, statin.  Active Problems:  Hyperlipidemia - cont statin   Hypertension - SBP 90s - 130 on current Rx   Chronic kidney disease/ESRD on HD - had HD yesterday  Chronic Systolic CHF (congestive heart failure) - ICM - Ventriculography: EF: 30% % at cath, diffuse hypokinesis worse in the anterior wall and apex - volume management with HD, had HD yesterday, still with some extra volume on board, may be at her baseline.   Plan - continue ambulation, possible d/c today if pt a little stronger.       Melida Quitter , PA-C 6:53 AM 03/07/2011

## 2011-03-07 NOTE — Progress Notes (Signed)
   CARE MANAGEMENT NOTE 03/07/2011  Patient:  Melissa [name], Melissa [name]   Account Number:  [id number]  Date Initiated:  03/07/2011  Documentation initiated by:  GRAVES-BIGELOW,Trystian Crisanto  Subjective/Objective Assessment:   Pt admitted with NStemi. Plan for home with hh services. Pt lives alone and has support of son and 2 daughters. Pt has HD , T,TH,SAT. Has dme [place], rw, cane and toilet riser.     Action/Plan:   CM spoke to pt and she chose [facility] for services. CM made referral for serivces. SOC to begin within 24-48 hours post d/c. CM placed orders and MD will need to sign face to face forms.   Anticipated DC Date:  03/07/2011   Anticipated DC Plan:  HOME W HOME HEALTH SERVICES      DC Planning Services  CM consult      [facility] Choice  HOME HEALTH   Choice offered to / List presented to:  [name]        HH arranged  HH-1 RN  HH-10 DISEASE MANAGEMENT  HH-2 PT  HH-3 OT      [facility] agency  [facility]   Status of service:  Completed, signed off Medicare Important Message given?   (If response is "NO", the following Medicare IM given date fields will be blank) Date Medicare IM given:   Date Additional Medicare IM given:    Discharge Disposition:  HOME W HOME HEALTH SERVICES  Per UR Regulation:    Comments:

## 2011-03-07 NOTE — Progress Notes (Signed)
Pt just walked with PT/OT. See their note. Found pts stent card so gave it to her. Encouraged increase walking around house and discussed NTG. [id number] Ethelda Chick CES, ACSM

## 2011-03-08 MED FILL — Dextrose Inj 5%: INTRAVENOUS | Qty: 50 | Status: AC

## 2011-03-18 ENCOUNTER — Encounter: Payer: Self-pay | Admitting: Cardiology

## 2011-03-19 ENCOUNTER — Encounter: Payer: Self-pay | Admitting: Cardiology

## 2011-03-19 ENCOUNTER — Ambulatory Visit (INDEPENDENT_AMBULATORY_CARE_PROVIDER_SITE_OTHER): Payer: Medicare Other | Admitting: Cardiology

## 2011-03-19 DIAGNOSIS — I2510 Atherosclerotic heart disease of native coronary artery without angina pectoris: Secondary | ICD-10-CM

## 2011-03-19 DIAGNOSIS — I509 Heart failure, unspecified: Secondary | ICD-10-CM

## 2011-03-19 DIAGNOSIS — N189 Chronic kidney disease, unspecified: Secondary | ICD-10-CM

## 2011-03-19 DIAGNOSIS — I10 Essential (primary) hypertension: Secondary | ICD-10-CM

## 2011-03-19 DIAGNOSIS — I359 Nonrheumatic aortic valve disorder, unspecified: Secondary | ICD-10-CM

## 2011-03-19 DIAGNOSIS — I779 Disorder of arteries and arterioles, unspecified: Secondary | ICD-10-CM

## 2011-03-19 DIAGNOSIS — I350 Nonrheumatic aortic (valve) stenosis: Secondary | ICD-10-CM

## 2011-03-19 NOTE — Assessment & Plan Note (Signed)
Her blood pressure is controlled.  

## 2011-03-19 NOTE — Patient Instructions (Signed)
Follow up as scheduled. Your physician recommends that you continue on your current medications as directed. Please refer to the Current Medication list given to you today. 

## 2011-03-19 NOTE — Assessment & Plan Note (Signed)
She has no significant signs of CHF at this time when her dialysis is under good control.

## 2011-03-19 NOTE — Assessment & Plan Note (Signed)
Historically it was known that the patient had significant coronary disease. She had some mild enzyme elevation in January, 2012 when she had sepsis and anemia. Her EF at that time was 50% and she was treated medically. She then had this recent event with a non-STEMI when she was volume overloaded. This led to 2 drug-eluting stents in the LAD in February, 2013. Her exertional shortness of breath is much better. She will be on aspirin and Plavix for at least one year. She is stable today. No further workup.

## 2011-03-19 NOTE — Assessment & Plan Note (Signed)
Her carotid disease is stable. Later this year she'll need a followup Doppler.

## 2011-03-19 NOTE — Assessment & Plan Note (Signed)
Very useful information about the aortic valve was obtained at the time of her catheterization. She has only moderate left ear that has remained stable. This will be watched over time.

## 2011-03-19 NOTE — Assessment & Plan Note (Signed)
She continues on dialysis and is stable with this.

## 2011-03-19 NOTE — Progress Notes (Signed)
HPI Patient is seen today to followup coronary artery disease. I had seen her last in June, 2012. She was stable at that time. The patient missed dialysis on one occasion. She had worsening shortness of breath and was admitted to the hospital. There was some enzyme elevation and she was transferred to [facility] for catheterization. Her aortic valve was carefully assessed. Her aortic stenosis remains only moderate. She had significant LAD disease and received 2 drug-eluting stents. As part of today's evaluation I have reviewed all of my older records. I have also reviewed the hospital H&P and discharge summary. I have reviewed the cath report and the PCI report.  The patient relates that in the weeks before her presentation to the hospital she did have more exertional shortness of breath. I suspect that this was her symptom of ischemia. This is now gone and she is feeling much better.   Allergies  Allergen Reactions  . Other Nausea And Vomiting    NARCOTICS    Current Outpatient Prescriptions  Medication Sig Dispense Refill  . amLODipine (NORVASC) 5 MG tablet Take 5 mg by mouth daily.      Marland Kitchen aspirin EC 81 MG tablet Take 81 mg by mouth daily.      . carvedilol (COREG) 12.5 MG tablet Take 1 tablet (12.5 mg total) by mouth 2 (two) times daily.  60 tablet  6  . cinacalcet (SENSIPAR) 30 MG tablet Take 30 mg by mouth daily.       . cloNIDine (CATAPRES - DOSED IN MG/24 HR) 0.3 mg/24hr Place 1 patch onto the skin once a week.       . clopidogrel (PLAVIX) 75 MG tablet Take 75 mg by mouth daily.        Marland Kitchen doxazosin (CARDURA) 8 MG tablet Take 8 mg by mouth at bedtime.      . folic acid-vitamin b complex-vitamin c-selenium-zinc (DIALYVITE) 3 MG TABS Take 1 tablet by mouth daily.        . furosemide (LASIX) 40 MG tablet Take 1 tablet (40 mg total) by mouth daily.  30 tablet  11  . hydrALAZINE (APRESOLINE) 50 MG tablet Take 50 mg by mouth 2 (two) times daily.        . insulin glargine (LANTUS) 100 UNIT/ML  injection Inject 10 Units into the skin at bedtime. Patient does not take if blood sugar is 100 or below.      . insulin glulisine (APIDRA) 100 UNIT/ML injection Inject 5-15 Units into the skin 3 (three) times daily before meals. Sliding scale      . nitroGLYCERIN (NITROSTAT) 0.4 MG SL tablet Place 1 tablet (0.4 mg total) under the tongue every 5 (five) minutes as needed for chest pain.  25 tablet  3  . olmesartan (BENICAR) 40 MG tablet Take 40 mg by mouth daily.        Marland Kitchen omeprazole (PRILOSEC) 20 MG capsule Take 20 mg by mouth daily.        . polyethylene glycol powder (GLYCOLAX/MIRALAX) powder Take 68 g by mouth 3 (three) times a week. Tuesday, Thursday, Sunday      . sevelamer (RENAGEL) 800 MG tablet Take 1,600-3,200 mg by mouth See admin instructions. Takes 3200 mg with meals and 1600mg  with each snack      . simvastatin (ZOCOR) 40 MG tablet Take 40 mg by mouth at bedtime.        . sodium polystyrene (KAYEXALATE) 15 GM/60ML suspension Take 15 g by mouth 2 (two) times a  week. Patient takes 15 g on Saturday and Sunday.        History   Social History  . Marital Status: Divorced    Spouse Name: N/A    Number of Children: N/A  . Years of Education: N/A   Occupational History  . Not on file.   Social History Main Topics  . Smoking status: Never Smoker   . Smokeless tobacco: Never Used  . Alcohol Use: No  . Drug Use: No  . Sexually Active: No   Other Topics Concern  . Not on file   Social History Narrative   Lives in [place] alone. Divorced and has four children.Has home health aide 5 days/week    Family History  Problem Relation Age of Onset  . Coronary artery disease Mother     died age 59  . Diabetes type II Mother   . Other Father     died in Jul 19, 2022 of accident  . Diabetes type II Daughter   . Hiatal hernia Son     Past Medical History  Diagnosis Date  . Coronary artery disease     catheterization March 2011.(presentation wtih mild CHF and mild increased troponin))..70%  proximal/80% mid LAD,50% circumflex.Marland KitchenLAD could be approached but with difficulty....medicall therapy recommended/NSTEMI 01/31/2010 with sepsis and anemia...EF 50%...medical Rx  . Diabetes mellitus   . Hyperlipidemia   . Aortic stenosis     mild to moderate...echo.Marland KitchenMarland KitchenSeptember,2006/catheterization valve area 1.9cm square/mod/severe....echo...10/2009/mod/severe....echo....01/31/2010  . 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 Hypertension   . Anemia   . CVA (cerebral vascular accident)   . Chronic kidney disease     ESRD...dialysis  . Carotid artery disease     doppler 07-18-2009 /  doppler 05/08/2010 similar, <50% bilateral with some plaque  . Edema     Past Surgical History  Procedure Date  . Appendectomy   . Oophorectomy   . Toe amputation     left great toe  . Retinal detachment surgery   . Cataract extraction     ROS  Patient denies fever, chills, headache, sweats, rash, change in vision, change in hearing, chest pain, cough, nausea vomiting, urinary symptoms. All other systems are reviewed and are negative.  PHYSICAL EXAM Patient is oriented to person time and place. Affect is normal. She is overweight. She walks with a cane. There is no jugulovenous distention. There are radiated murmurs to the neck. Lungs are clear. Respiratory effort is nonlabored. Cardiac exam reveals S1 and S2. There is a 3/6 crescendo decrescendo systolic murmur consistent with her aortic stenosis. The abdomen is soft. There is no significant peripheral edema. Her cath site in her right groin is nicely healed. She has no skin rashes. Filed Vitals:   03/19/11 0945  BP: 127/69  Pulse: 67  Height: 5\' 4"  (1.626 m)  Weight: 200 lb (90.719 kg)  EKG is done today and reviewed by me. I have compared to older tracings. Her QRS duration is 112 ms which is unchanged. She has diffuse ST changes. She has inverted T waves from V2 to V6. This is slightly more marked in the  past.  ASSESSMENT & PLAN

## 2011-05-14 ENCOUNTER — Encounter: Payer: Self-pay | Admitting: Cardiology

## 2011-05-14 ENCOUNTER — Ambulatory Visit: Payer: Medicare Other | Admitting: Cardiology

## 2011-05-14 ENCOUNTER — Ambulatory Visit (INDEPENDENT_AMBULATORY_CARE_PROVIDER_SITE_OTHER): Payer: Medicare Other | Admitting: Cardiology

## 2011-05-14 VITALS — BP 165/81 | HR 63 | Ht 64.0 in | Wt 203.0 lb

## 2011-05-14 DIAGNOSIS — R9430 Abnormal result of cardiovascular function study, unspecified: Secondary | ICD-10-CM

## 2011-05-14 DIAGNOSIS — I509 Heart failure, unspecified: Secondary | ICD-10-CM

## 2011-05-14 DIAGNOSIS — I359 Nonrheumatic aortic valve disorder, unspecified: Secondary | ICD-10-CM

## 2011-05-14 DIAGNOSIS — I2510 Atherosclerotic heart disease of native coronary artery without angina pectoris: Secondary | ICD-10-CM

## 2011-05-14 DIAGNOSIS — I779 Disorder of arteries and arterioles, unspecified: Secondary | ICD-10-CM

## 2011-05-14 DIAGNOSIS — R0989 Other specified symptoms and signs involving the circulatory and respiratory systems: Secondary | ICD-10-CM

## 2011-05-14 DIAGNOSIS — I350 Nonrheumatic aortic (valve) stenosis: Secondary | ICD-10-CM

## 2011-05-14 DIAGNOSIS — I10 Essential (primary) hypertension: Secondary | ICD-10-CM

## 2011-05-14 NOTE — Assessment & Plan Note (Signed)
Her volume is well controlled with dialysis. No change in therapy.

## 2011-05-14 NOTE — Patient Instructions (Signed)
Your physician wants you to follow-up in: 6 months. You will receive a reminder letter in the mail one-two months in advance. If you don't receive a letter, please call our office to schedule the follow-up appointment. Your physician recommends that you continue on your current medications as directed. Please refer to the Current Medication list given to you today. 

## 2011-05-14 NOTE — Assessment & Plan Note (Signed)
We know from careful assessment in the cath lab that her aortic stenosis is only moderate. She has high output from her dialysis shunt. This will be followed.

## 2011-05-14 NOTE — Assessment & Plan Note (Signed)
Even though her systolic pressure is elevated today to be no change in her meds. She has marked fluctuations related to her dialysis. In general her pressure  Goes up at times in the afternoon especially when she has to be up a lot and traveling physician offices etc.

## 2011-05-14 NOTE — Assessment & Plan Note (Signed)
Coronary disease is stable. I'll see her back in 6 months. She'll remain on aspirin and Plavix.

## 2011-05-14 NOTE — Progress Notes (Signed)
HPI Patient returns today to followup coronary artery disease. I saw her last March 19, 2011. She had undergone catheterization receiving 2 drug-eluting stents just prior to that. She also had very careful assessment of aortic valve. Her aortic stenosis remains only mild to moderate. She has high output from her dialysis shunt. When I saw her in February she was stable post hospital.  She's doing well. Her ejection fraction was 50% at the time of her catheterization. She had exertional shortness of breath before she had her coronary intervention. She is not having this shortness of breath at this time. She will be on aspirin and Plavix for a year. Her aortic stenosis will be followed.  Allergies  Allergen Reactions  . Other Nausea And Vomiting    NARCOTICS    Current Outpatient Prescriptions  Medication Sig Dispense Refill  . amLODipine (NORVASC) 5 MG tablet Take 5 mg by mouth daily.      Marland Kitchen aspirin EC 81 MG tablet Take 81 mg by mouth daily.      . carvedilol (COREG) 12.5 MG tablet Take 1 tablet (12.5 mg total) by mouth 2 (two) times daily.  60 tablet  6  . cinacalcet (SENSIPAR) 30 MG tablet Take 30 mg by mouth daily.       . cloNIDine (CATAPRES - DOSED IN MG/24 HR) 0.3 mg/24hr Place 1 patch onto the skin once a week.       . clopidogrel (PLAVIX) 75 MG tablet Take 75 mg by mouth daily.        Marland Kitchen doxazosin (CARDURA) 8 MG tablet Take 8 mg by mouth at bedtime.      . folic acid-vitamin b complex-vitamin c-selenium-zinc (DIALYVITE) 3 MG TABS Take 1 tablet by mouth daily.        . furosemide (LASIX) 40 MG tablet Take 1 tablet (40 mg total) by mouth daily.  30 tablet  11  . hydrALAZINE (APRESOLINE) 50 MG tablet Take 50 mg by mouth 2 (two) times daily.        . insulin glargine (LANTUS) 100 UNIT/ML injection Inject 10 Units into the skin at bedtime. Patient does not take if blood sugar is 100 or below.      . insulin glulisine (APIDRA) 100 UNIT/ML injection Inject 5-15 Units into the skin 3  (three) times daily before meals. Sliding scale      . nitroGLYCERIN (NITROSTAT) 0.4 MG SL tablet Place 1 tablet (0.4 mg total) under the tongue every 5 (five) minutes as needed for chest pain.  25 tablet  3  . olmesartan (BENICAR) 40 MG tablet Take 40 mg by mouth daily.        Marland Kitchen omeprazole (PRILOSEC) 20 MG capsule Take 20 mg by mouth daily.        . polyethylene glycol powder (GLYCOLAX/MIRALAX) powder Take 68 g by mouth 3 (three) times a week. Tuesday, Thursday, Sunday      . sevelamer (RENAGEL) 800 MG tablet Take 1,600-3,200 mg by mouth See admin instructions. Takes 3200 mg with meals and 1600mg  with each snack      . simvastatin (ZOCOR) 40 MG tablet Take 40 mg by mouth at bedtime.        . sodium polystyrene (KAYEXALATE) 15 GM/60ML suspension Take 15 g by mouth 2 (two) times a week. Patient takes 15 g on Saturday and Sunday.        History   Social History  . Marital Status: Divorced    Spouse Name: N/A  Number of Children: N/A  . Years of Education: N/A   Occupational History  . Not on file.   Social History Main Topics  . Smoking status: Never Smoker   . Smokeless tobacco: Never Used  . Alcohol Use: No  . Drug Use: No  . Sexually Active: No   Other Topics Concern  . Not on file   Social History Narrative   Lives in [place] alone. Divorced and has four children.Has home health aide 5 days/week    Family History  Problem Relation Age of Onset  . Coronary artery disease Mother     died age 84  . Diabetes type II Mother   . Other Father     died in Jul 08, 2022 of accident  . Diabetes type II Daughter   . Hiatal hernia Son     Past Medical History  Diagnosis Date  . Coronary artery disease     catheterization March 2011.(presentation wtih mild CHF and mild increased troponin))..70% proximal/80% mid LAD,50% circumflex.Marland KitchenLAD could be approached but with difficulty....medicall therapy recommended/NSTEMI 01/31/2010 with sepsis and anemia...EF 50%...medical Rx  . Diabetes mellitus     . Hyperlipidemia   . Aortic stenosis     mild to moderate...echo.Marland KitchenMarland KitchenSeptember,2006/catheterization valve area 1.9cm square/mod/severe....echo...10/2009/mod/severe....echo....01/31/2010  . 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 Hypertension   . Anemia   . CVA (cerebral vascular accident)   . Chronic kidney disease     ESRD...dialysis  . Carotid artery disease     doppler 2009/07/07 /  doppler 05/08/2010 similar, <50% bilateral with some plaque  . Edema     Past Surgical History  Procedure Date  . Appendectomy   . Oophorectomy   . Toe amputation     left great toe  . Retinal detachment surgery   . Cataract extraction     ROS  Patient denies fever, chills, headache, sweats, rash, change in vision, change in hearing, chest pain, cough, shortness of breath, nausea vomiting, urinary symptoms. All other systems are reviewed and are negative.  PHYSICAL EXAM  She is stable and in good spirits today. There is no jugular venous distention. Lungs are clear. Respiratory effort is nonlabored. She has a high-pitched murmur of aortic stenosis. The abdomen is soft. There is no peripheral edema.  Filed Vitals:   05/14/11 1423  BP: 165/81  Pulse: 63  Height: 5\' 4"  (1.626 m)  Weight: 203 lb (92.08 kg)     ASSESSMENT & PLAN

## 2011-05-14 NOTE — Assessment & Plan Note (Signed)
When I see her back at the next visit we will look into arrange for a followup Doppler.

## 2011-06-15 ENCOUNTER — Inpatient Hospital Stay (HOSPITAL_COMMUNITY)
Admission: AD | Admit: 2011-06-15 | Discharge: 2011-06-19 | DRG: 246 | Disposition: A | Discharge status: Home / Self Care | Payer: Medicare Other | Source: Other Acute Inpatient Hospital | Attending: Internal Medicine | Admitting: Internal Medicine

## 2011-06-15 DIAGNOSIS — Z8673 Personal history of transient ischemic attack (TIA), and cerebral infarction without residual deficits: Secondary | ICD-10-CM

## 2011-06-15 DIAGNOSIS — I214 Non-ST elevation (NSTEMI) myocardial infarction: Principal | ICD-10-CM

## 2011-06-15 DIAGNOSIS — E119 Type 2 diabetes mellitus without complications: Secondary | ICD-10-CM | POA: Diagnosis present

## 2011-06-15 DIAGNOSIS — Z794 Long term (current) use of insulin: Secondary | ICD-10-CM

## 2011-06-15 DIAGNOSIS — D631 Anemia in chronic kidney disease: Secondary | ICD-10-CM | POA: Diagnosis present

## 2011-06-15 DIAGNOSIS — Z7982 Long term (current) use of aspirin: Secondary | ICD-10-CM

## 2011-06-15 DIAGNOSIS — I509 Heart failure, unspecified: Secondary | ICD-10-CM

## 2011-06-15 DIAGNOSIS — I6529 Occlusion and stenosis of unspecified carotid artery: Secondary | ICD-10-CM | POA: Diagnosis present

## 2011-06-15 DIAGNOSIS — I359 Nonrheumatic aortic valve disorder, unspecified: Secondary | ICD-10-CM | POA: Diagnosis present

## 2011-06-15 DIAGNOSIS — N039 Chronic nephritic syndrome with unspecified morphologic changes: Secondary | ICD-10-CM | POA: Diagnosis present

## 2011-06-15 DIAGNOSIS — I252 Old myocardial infarction: Secondary | ICD-10-CM

## 2011-06-15 DIAGNOSIS — I120 Hypertensive chronic kidney disease with stage 5 chronic kidney disease or end stage renal disease: Secondary | ICD-10-CM | POA: Diagnosis present

## 2011-06-15 DIAGNOSIS — E875 Hyperkalemia: Secondary | ICD-10-CM | POA: Diagnosis present

## 2011-06-15 DIAGNOSIS — Z888 Allergy status to other drugs, medicaments and biological substances status: Secondary | ICD-10-CM

## 2011-06-15 DIAGNOSIS — E209 Hypoparathyroidism, unspecified: Secondary | ICD-10-CM | POA: Diagnosis present

## 2011-06-15 DIAGNOSIS — D696 Thrombocytopenia, unspecified: Secondary | ICD-10-CM

## 2011-06-15 DIAGNOSIS — I350 Nonrheumatic aortic (valve) stenosis: Secondary | ICD-10-CM

## 2011-06-15 DIAGNOSIS — N189 Chronic kidney disease, unspecified: Secondary | ICD-10-CM

## 2011-06-15 DIAGNOSIS — N186 End stage renal disease: Secondary | ICD-10-CM | POA: Diagnosis present

## 2011-06-15 DIAGNOSIS — E785 Hyperlipidemia, unspecified: Secondary | ICD-10-CM

## 2011-06-15 DIAGNOSIS — I10 Essential (primary) hypertension: Secondary | ICD-10-CM

## 2011-06-15 DIAGNOSIS — E1149 Type 2 diabetes mellitus with other diabetic neurological complication: Secondary | ICD-10-CM

## 2011-06-15 DIAGNOSIS — I2510 Atherosclerotic heart disease of native coronary artery without angina pectoris: Secondary | ICD-10-CM

## 2011-06-15 DIAGNOSIS — I658 Occlusion and stenosis of other precerebral arteries: Secondary | ICD-10-CM | POA: Diagnosis present

## 2011-06-15 DIAGNOSIS — Z79899 Other long term (current) drug therapy: Secondary | ICD-10-CM

## 2011-06-15 LAB — GLUCOSE, CAPILLARY

## 2011-06-16 ENCOUNTER — Inpatient Hospital Stay (HOSPITAL_COMMUNITY): Payer: Medicare Other

## 2011-06-16 ENCOUNTER — Encounter (HOSPITAL_COMMUNITY): Payer: Self-pay | Admitting: Internal Medicine

## 2011-06-16 DIAGNOSIS — I200 Unstable angina: Secondary | ICD-10-CM

## 2011-06-16 LAB — CBC
HCT: 30 % — ABNORMAL LOW (ref 36.0–46.0)
Hemoglobin: 9.6 g/dL — ABNORMAL LOW (ref 12.0–15.0)
MCH: 32.5 pg (ref 26.0–34.0)
MCHC: 31.9 g/dL (ref 30.0–36.0)
MCV: 102.1 fL — ABNORMAL HIGH (ref 78.0–100.0)
Platelets: 91 10*3/uL — ABNORMAL LOW (ref 150–400)
RBC: 2.89 MIL/uL — ABNORMAL LOW (ref 3.87–5.11)
WBC: 5.3 10*3/uL (ref 4.0–10.5)

## 2011-06-16 LAB — COMPREHENSIVE METABOLIC PANEL
ALT: 10 U/L (ref 0–35)
AST: 19 U/L (ref 0–37)
AST: 21 U/L (ref 0–37)
Albumin: 3.5 g/dL (ref 3.5–5.2)
Albumin: 3.4 g/dL — ABNORMAL LOW (ref 3.5–5.2)
Alkaline Phosphatase: 97 U/L (ref 39–117)
CO2: 22 mEq/L (ref 19–32)
Chloride: 104 mEq/L (ref 96–112)
Chloride: 103 mEq/L (ref 96–112)
Creatinine, Ser: 6.58 mg/dL — ABNORMAL HIGH (ref 0.50–1.10)
Creatinine, Ser: 6.81 mg/dL — ABNORMAL HIGH (ref 0.50–1.10)
GFR calc non Af Amer: 6 mL/min — ABNORMAL LOW (ref >90)
Potassium: 5.9 mEq/L — ABNORMAL HIGH (ref 3.5–5.1)
Potassium: 6.2 mEq/L — ABNORMAL HIGH (ref 3.5–5.1)
Sodium: 139 mEq/L (ref 135–145)
Sodium: 138 mEq/L (ref 135–145)
Total Bilirubin: 0.2 mg/dL — ABNORMAL LOW (ref 0.3–1.2)
Total Bilirubin: 0.2 mg/dL — ABNORMAL LOW (ref 0.3–1.2)

## 2011-06-16 LAB — DIFFERENTIAL
Basophils Relative: 1 % (ref 0–1)
Eosinophils Absolute: 0.2 10*3/uL (ref 0.0–0.7)
Eosinophils Relative: 4 % (ref 0–5)
Lymphs Abs: 1.1 10*3/uL (ref 0.7–4.0)

## 2011-06-16 LAB — CARDIAC PANEL(CRET KIN+CKTOT+MB+TROPI)
CK, MB: 9.6 ng/mL (ref 0.3–4.0)
CK, MB: 12.9 ng/mL (ref 0.3–4.0)
Total CK: 111 U/L (ref 7–177)
Total CK: 110 U/L (ref 7–177)
Troponin I: 2.33 ng/mL (ref <0.30)
Troponin I: 1.65 ng/mL (ref <0.30)

## 2011-06-16 LAB — GLUCOSE, CAPILLARY: Glucose-Capillary: 116 mg/dL — ABNORMAL HIGH (ref 70–99)

## 2011-06-16 LAB — HEPARIN LEVEL (UNFRACTIONATED): Heparin Unfractionated: 0.68 IU/mL (ref 0.30–0.70)

## 2011-06-16 LAB — APTT: aPTT: 135 seconds — ABNORMAL HIGH (ref 24–37)

## 2011-06-16 LAB — HEMOGLOBIN A1C: Mean Plasma Glucose: 111 mg/dL (ref <117)

## 2011-06-16 MED ORDER — SODIUM POLYSTYRENE SULFONATE 15 GM/60ML PO SUSP
15.0000 g | ORAL | Status: DC
  Administered 2011-06-16 – 2011-06-17 (×2): 15 g via ORAL
  Filled 2011-06-16 (×3): qty 60

## 2011-06-16 MED ORDER — CINACALCET HCL 30 MG PO TABS
30.0000 mg | ORAL_TABLET | Freq: Every day | ORAL | Status: DC
  Administered 2011-06-16 – 2011-06-19 (×3): 30 mg via ORAL
  Filled 2011-06-16 (×5): qty 1

## 2011-06-16 MED ORDER — HYDRALAZINE HCL 50 MG PO TABS
50.0000 mg | ORAL_TABLET | Freq: Two times a day (BID) | ORAL | Status: DC
  Administered 2011-06-16 – 2011-06-19 (×6): 50 mg via ORAL
  Filled 2011-06-16 (×10): qty 1

## 2011-06-16 MED ORDER — INSULIN ASPART 100 UNIT/ML ~~LOC~~ SOLN
0.0000 [IU] | Freq: Three times a day (TID) | SUBCUTANEOUS | Status: DC
  Administered 2011-06-17: 1 [IU] via SUBCUTANEOUS

## 2011-06-16 MED ORDER — LIDOCAINE-PRILOCAINE 2.5-2.5 % EX CREA: 1.0000 "application " | TOPICAL_CREAM | CUTANEOUS | Status: DC | PRN

## 2011-06-16 MED ORDER — PENTAFLUOROPROP-TETRAFLUOROETH EX AERO: 1.0000 "application " | INHALATION_SPRAY | CUTANEOUS | Status: DC | PRN

## 2011-06-16 MED ORDER — NITROGLYCERIN 0.4 MG SL SUBL: 0.4000 mg | SUBLINGUAL_TABLET | SUBLINGUAL | Status: DC | PRN

## 2011-06-16 MED ORDER — CLOPIDOGREL BISULFATE 75 MG PO TABS
75.0000 mg | ORAL_TABLET | Freq: Every day | ORAL | Status: DC
  Administered 2011-06-17 – 2011-06-19 (×2): 75 mg via ORAL
  Filled 2011-06-16 (×2): qty 1

## 2011-06-16 MED ORDER — LIDOCAINE HCL (PF) 1 % IJ SOLN
5.0000 mL | INTRAMUSCULAR | Status: DC | PRN
  Filled 2011-06-16: qty 5

## 2011-06-16 MED ORDER — DEXTROSE-NACL 5-0.45 % IV SOLN
INTRAVENOUS | Status: DC
  Administered 2011-06-16: 03:00:00 via INTRAVENOUS

## 2011-06-16 MED ORDER — ONDANSETRON HCL 4 MG/2ML IJ SOLN: 4.0000 mg | Freq: Four times a day (QID) | INTRAMUSCULAR | Status: DC | PRN

## 2011-06-16 MED ORDER — ATORVASTATIN CALCIUM 20 MG PO TABS
20.0000 mg | ORAL_TABLET | Freq: Every day | ORAL | Status: DC
  Administered 2011-06-16 – 2011-06-18 (×3): 20 mg via ORAL
  Filled 2011-06-16 (×6): qty 1

## 2011-06-16 MED ORDER — INSULIN GLARGINE 100 UNIT/ML ~~LOC~~ SOLN
10.0000 [IU] | Freq: Every day | SUBCUTANEOUS | Status: DC
  Administered 2011-06-16 – 2011-06-18 (×3): 10 [IU] via SUBCUTANEOUS

## 2011-06-16 MED ORDER — SEVELAMER HCL 800 MG PO TABS
3200.0000 mg | ORAL_TABLET | Freq: Three times a day (TID) | ORAL | Status: DC
  Filled 2011-06-16 (×7): qty 4

## 2011-06-16 MED ORDER — DEXTROSE 10 % IV SOLN: INTRAVENOUS | Status: DC

## 2011-06-16 MED ORDER — NEPRO/CARBSTEADY PO LIQD
237.0000 mL | ORAL | Status: DC | PRN
  Filled 2011-06-16: qty 237

## 2011-06-16 MED ORDER — HEPARIN (PORCINE) IN NACL 100-0.45 UNIT/ML-% IJ SOLN
1100.0000 [IU]/h | INTRAMUSCULAR | Status: DC
  Administered 2011-06-17: 1250 [IU]/h via INTRAVENOUS
  Filled 2011-06-16 (×4): qty 250

## 2011-06-16 MED ORDER — FUROSEMIDE 40 MG PO TABS
40.0000 mg | ORAL_TABLET | Freq: Every day | ORAL | Status: DC
  Administered 2011-06-17: 40 mg via ORAL
  Filled 2011-06-16 (×3): qty 1

## 2011-06-16 MED ORDER — POLYETHYLENE GLYCOL 3350 17 GM/SCOOP PO POWD: 68.0000 g | ORAL | Status: DC

## 2011-06-16 MED ORDER — HEPARIN SODIUM (PORCINE) 1000 UNIT/ML DIALYSIS
1000.0000 [IU] | INTRAMUSCULAR | Status: DC | PRN
  Filled 2011-06-16: qty 1

## 2011-06-16 MED ORDER — AMLODIPINE BESYLATE 5 MG PO TABS
5.0000 mg | ORAL_TABLET | Freq: Every day | ORAL | Status: DC
  Administered 2011-06-17 – 2011-06-19 (×3): 5 mg via ORAL
  Filled 2011-06-16 (×4): qty 1

## 2011-06-16 MED ORDER — SODIUM CHLORIDE 0.9 % IV SOLN: 100.0000 mL | INTRAVENOUS | Status: DC | PRN

## 2011-06-16 MED ORDER — ACETAMINOPHEN 325 MG PO TABS
650.0000 mg | ORAL_TABLET | ORAL | Status: DC | PRN
  Administered 2011-06-17 – 2011-06-19 (×3): 650 mg via ORAL
  Filled 2011-06-16 (×3): qty 2

## 2011-06-16 MED ORDER — SODIUM POLYSTYRENE SULFONATE 15 GM/60ML PO SUSP: 15.0000 g | ORAL | Status: DC

## 2011-06-16 MED ORDER — ALTEPLASE 2 MG IJ SOLR
2.0000 mg | Freq: Once | INTRAMUSCULAR | Status: AC | PRN
  Filled 2011-06-16: qty 2

## 2011-06-16 MED ORDER — CARVEDILOL 12.5 MG PO TABS
12.5000 mg | ORAL_TABLET | Freq: Two times a day (BID) | ORAL | Status: DC
  Administered 2011-06-16 – 2011-06-19 (×5): 12.5 mg via ORAL
  Filled 2011-06-16 (×10): qty 1

## 2011-06-16 MED ORDER — PANTOPRAZOLE SODIUM 40 MG PO TBEC
40.0000 mg | DELAYED_RELEASE_TABLET | Freq: Every day | ORAL | Status: DC
  Administered 2011-06-17: 40 mg via ORAL
  Filled 2011-06-16: qty 1

## 2011-06-16 MED ORDER — POLYETHYLENE GLYCOL 3350 17 G PO PACK
68.0000 g | PACK | ORAL | Status: DC
  Filled 2011-06-16: qty 4

## 2011-06-16 MED ORDER — SEVELAMER CARBONATE 800 MG PO TABS
1600.0000 mg | ORAL_TABLET | ORAL | Status: DC | PRN
  Administered 2011-06-16 (×2): 1600 mg via ORAL
  Filled 2011-06-16: qty 2

## 2011-06-16 MED ORDER — ASPIRIN EC 325 MG PO TBEC
325.0000 mg | DELAYED_RELEASE_TABLET | Freq: Every day | ORAL | Status: DC
  Administered 2011-06-17 – 2011-06-19 (×2): 325 mg via ORAL
  Filled 2011-06-16 (×2): qty 1

## 2011-06-16 MED ORDER — SIMVASTATIN 40 MG PO TABS
40.0000 mg | ORAL_TABLET | Freq: Every day | ORAL | Status: DC
  Administered 2011-06-16: 40 mg via ORAL
  Filled 2011-06-16 (×2): qty 1

## 2011-06-16 MED ORDER — CLONIDINE HCL 0.3 MG/24HR TD PTWK
0.3000 mg | MEDICATED_PATCH | TRANSDERMAL | Status: DC
  Administered 2011-06-17: 0.3 mg via TRANSDERMAL
  Filled 2011-06-16: qty 1

## 2011-06-16 MED ORDER — DOXAZOSIN MESYLATE 8 MG PO TABS
8.0000 mg | ORAL_TABLET | Freq: Every day | ORAL | Status: DC
  Administered 2011-06-16 – 2011-06-18 (×3): 8 mg via ORAL
  Filled 2011-06-16 (×5): qty 1

## 2011-06-16 NOTE — Progress Notes (Signed)
Please see Dr Silvana Newness H&P for full details. Pt with known CAD and ESRD now presents with increasing chest pain.  She has chronic TW inversions which are more prominent.  She is presently chest pan free and clinically stable.  Given hyperkalemia, will proceed with HD as ordered by Nephrology this am. We will follow closely.  If she has worsening chest pain despite medical therapy then we will consider urgent cath.  Otherwise, we will plan for cath on Monday.  Fayrene Fearing Curly Mackowski,MD

## 2011-06-16 NOTE — Progress Notes (Signed)
PHARMACIST - PHYSICIAN COMMUNICATION DR:  Charm Barges and colleagues  CONCERNING:  Simvastatin/Amlodipine interaction Melissa [name] is prescribed Amlodipine and Simvastatin 40mg .  Amlodipine can significantly increase Simvastatin concentrations.  Simvastatin doses of >20mg  should not be used with Amlodipine.  RECOMMENDATION: Per [employer] policy I have changed her Simvastatin to Atorvastatin.  Please consider a different statin for her at discharge to decrease the likelihood of adverse events.  Melissa [name], Pharm.D., BCPS Clinical Pharmacist  Phone [phone] Pager [phone] 06/16/2011, 8:53 AM

## 2011-06-16 NOTE — Progress Notes (Signed)
  ANTICOAGULATION CONSULT NOTE   Pharmacy Consult for Heparin Indication: chest pain/ACS  Allergies  Allergen Reactions  . Codeine Nausea And Vomiting   Vital Signs: Temp: 98 F (36.7 C) (05/18 0606) Temp src: Oral (05/18 0606) BP: 146/83 mmHg (05/18 0606) Pulse Rate: 57  (05/18 0606)  Labs:  Basename 06/16/11 0615 06/16/11 0019 06/16/11 0018  HGB 9.6* 9.4* --  HCT 30.0* 29.5* --  PLT 83* 91* --  APTT -- 135* --  LABPROT -- 13.9 --  INR -- 1.05 --  HEPARINUNFRC 0.68 -- --  CREATININE -- 6.58* --  CKTOTAL -- -- 88  CKMB -- -- 9.6*  TROPONINI -- -- 2.33*    Estimated Creatinine Clearance: 8.5 ml/min (by C-G formula based on Cr of 6.58).  Assessment: 71 yo female with chest pain for Heparin  Goal of Therapy:  Heparin level 0.3-0.7 units/ml Monitor platelets by anticoagulation protocol: Yes   Plan:  Continue Heparin at current rate  Kerryann Allaire, [employer] 06/16/2011,7:27 AM

## 2011-06-16 NOTE — Progress Notes (Signed)
Had c/o of chest pressure. 1 NTG sl given with relief. BP 137/84/ HR : 79. EKG done. Dr. Johney Frame updated. HD aware of pt Chest pressure and for cardiology request to have HD done this afternoon pending cardiology eval. Thanks Ancil Linsey RN

## 2011-06-16 NOTE — H&P (Signed)
Cardiology H&P   Primary Care Povider: Donzetta Sprung, MD, MD Primary Cardiologist: Lovena Neighbours   HPI: Melissa [name] is a 71 y.o.female with ESRD, CAD with recent PCI to LAD, and moderate AS transferred from [facility] with acute coronary syndrome.  She reports 3 days of intermittent short episodes of chest pain.  Most of these have occurred at rest but only last seconds to minutes.  Today she began to have episodes that would last up to 15 minutes.  She has not had SOB, n/v, or diaphoresis.  She has not noted dyspnea on exertion.  She has no other symptoms.  She presented in early February with NSTEMI in setting of volume overload and was found to have mixed ischemia/scar in anterolateral distribution on cardiolite.  She underwent LHC showing obstructive dz in the LCx system and proximal/mid LAD.  She underwent PCI x 2 to the mid LAD with DES at that time. Since then she had been doing well, tolerating all of her meds, etc.  She is currently pain free without any symptoms.      PMH: 1. CAD:  - NSTEMI in 01/2010 in setting of sepsis and anemia - NSTEMI in 03/2011 in setting of volume overload, SPECT with anterolateral scar mixed ischemia, LHC with obstructive dz in ostial and mid circ, proximal and mid LAD, underwent PCI with DES x 2 to LAD 2. DM2 3. HLD 4. Aortic Stenosis: moderate by most recent LHC with mean gradient <20, peak to peak gradient , AVA by [place] 1.5cm2 5. Hypoparathyroidism 6. Anemia 7. HTN 8. CKD: ESRD x 5 years, HD via left arm fistula on T,Th,S 9. CVA 10. Carotid Artery Disease: bilateral <50% stenosis   Past Surgical History  Procedure Date  . Appendectomy   . Oophorectomy   . Toe amputation     left great toe  . Retinal detachment surgery   . Cataract extraction     Family History  Problem Relation Age of Onset  . Coronary artery disease Mother     died age 12  . Diabetes type II Mother   . Other Father     died in 07-01-2022 of accident  . Diabetes type II  Daughter   . Hiatal hernia Son     Social History:  reports that she has never smoked. She has never used smokeless tobacco. She reports that she does not drink alcohol or use illicit drugs.  Allergies:  Allergies  Allergen Reactions  . Codeine Nausea And Vomiting    Current Facility-Administered Medications  Medication Dose Route Frequency Provider Last Rate Last Dose  . acetaminophen (TYLENOL) tablet 650 mg  650 mg Oral Q4H PRN Meridee Score, MD      . amLODipine (NORVASC) tablet 5 mg  5 mg Oral Daily Meridee Score, MD      . aspirin EC tablet 325 mg  325 mg Oral Daily Meridee Score, MD      . carvedilol (COREG) tablet 12.5 mg  12.5 mg Oral BID WC Meridee Score, MD      . cinacalcet [facility]) tablet 30 mg  30 mg Oral Q breakfast Meridee Score, MD      . cloNIDine (CATAPRES - Dosed in mg/24 hr) patch 0.3 mg  0.3 mg Transdermal Weekly Meridee Score, MD      . clopidogrel (PLAVIX) tablet 75 mg  75 mg Oral Q breakfast Meridee Score, MD      . dextrose 5 %-0.45 % sodium chloride infusion   Intravenous Continuous  Meridee Score, MD      . doxazosin (CARDURA) tablet 8 mg  8 mg Oral QHS Meridee Score, MD      . furosemide (LASIX) tablet 40 mg  40 mg Oral Daily Meridee Score, MD      . heparin ADULT infusion 100 units/mL (25000 units/250 mL)  1,250 Units/hr Intravenous Continuous Meridee Score, MD 12.5 mL/hr at 06/16/11 0103 1,250 Units/hr at 06/16/11 0103  . hydrALAZINE (APRESOLINE) tablet 50 mg  50 mg Oral BID Meridee Score, MD      . insulin aspart (novoLOG) injection 0-9 Units  0-9 Units Subcutaneous TID WC Meridee Score, MD      . insulin glargine (LANTUS) injection 10 Units  10 Units Subcutaneous QHS Meridee Score, MD      . nitroGLYCERIN (NITROSTAT) SL tablet 0.4 mg  0.4 mg Sublingual Q5 Min x 3 PRN Meridee Score, MD      . ondansetron [facility]) injection 4 mg  4 mg Intravenous Q6H PRN Meridee Score, MD      . pantoprazole (PROTONIX) EC tablet 40 mg  40 mg Oral Q1200  Meridee Score, MD      . polyethylene glycol powder (GLYCOLAX/MIRALAX) container 68 g  68 g Oral 3 times weekly Meridee Score, MD      . sevelamer (RENAGEL) tablet 3,200 mg  3,200 mg Oral TID WC Meridee Score, MD      . sevelamer (RENVELA) tablet 1,600 mg  1,600 mg Oral PRN Meridee Score, MD      . simvastatin (ZOCOR) tablet 40 mg  40 mg Oral QHS Meridee Score, MD      . sodium polystyrene (KAYEXALATE) 15 GM/60ML suspension 15 g  15 g Oral 2 times weekly Meridee Score, MD      . DISCONTD: dextrose 10 % infusion   Intravenous Continuous Meridee Score, MD      . DISCONTD: sodium polystyrene (KAYEXALATE) 15 GM/60ML suspension 15 g  15 g Oral 2 times weekly Meridee Score, MD        ROS: A full review of systems is obtained and is negative except as noted in the HPI.  Physical Exam: Blood pressure 166/82, pulse 65, temperature 98.9 F (37.2 C), temperature source Oral, resp. rate 18, height 5\' 4"  (1.626 m), weight 91.763 kg (202 lb 4.8 oz), SpO2 100.00%.  GENERAL: no acute distress.  EYES: Extra ocular movements are intact. There is no lid lag. Sclera is anicteric.  ENT: Oropharynx is clear. Dentition is within normal limits.  NECK: Supple. The thyroid is not enlarged.  LYMPH: There are no masses or lymphadenopathy present.  HEART: Regular rate and rhythm with 2/6 early peaking SEM, preserved S2 LUNGS: Clear to auscultation There are no rales, rhonchi, or wheezes.  ABDOMEN: Soft, non-tender, and non-distended with normoactive bowel sounds. There is no hepatosplenomegaly.  EXTREMITIES: No clubbing, cyanosis, or edema.  PULSES: Femoral pulses were +2 and equal bilaterally. DP/PT pulses were +2 and equal bilaterally.  SKIN: Warm, dry, and intact.  NEUROLOGIC: The patient was oriented to person, place, and time. No overt neurologic deficits were detected.  PSYCH: Normal judgment and insight, mood is appropriate.   Results: Results for orders placed during the hospital encounter of  06/15/11 (from the past 24 hour(s))  GLUCOSE, CAPILLARY     Status: Abnormal   Collection Time   06/15/11 11:40 PM      Component Value Range   Glucose-Capillary 110 (*) 70 - 99 (mg/dL)   Comment 1 Notify RN  CARDIAC PANEL(CRET KIN+CKTOT+MB+TROPI)     Status: Abnormal   Collection Time   06/16/11 12:18 AM      Component Value Range   Total CK 88  7 - 177 (U/L)   CK, MB 9.6 (*) 0.3 - 4.0 (ng/mL)   Troponin I 2.33 (*) <0.30 (ng/mL)   Relative Index RELATIVE INDEX IS INVALID  0.0 - 2.5   COMPREHENSIVE METABOLIC PANEL     Status: Abnormal   Collection Time   06/16/11 12:19 AM      Component Value Range   Sodium 139  135 - 145 (mEq/L)   Potassium 5.9 (*) 3.5 - 5.1 (mEq/L)   Chloride 104  96 - 112 (mEq/L)   CO2 22  19 - 32 (mEq/L)   Glucose, Bld 98  70 - 99 (mg/dL)   BUN 50 (*) 6 - 23 (mg/dL)   Creatinine, Ser 4.54 (*) 0.50 - 1.10 (mg/dL)   Calcium 9.5  8.4 - 09.8 (mg/dL)   Total Protein 5.9 (*) 6.0 - 8.3 (g/dL)   Albumin 3.5  3.5 - 5.2 (g/dL)   AST 19  0 - 37 (U/L)   ALT 10  0 - 35 (U/L)   Alkaline Phosphatase 97  39 - 117 (U/L)   Total Bilirubin 0.2 (*) 0.3 - 1.2 (mg/dL)   GFR calc non Af Amer 6 (*) >90 (mL/min)   GFR calc Af Amer 7 (*) >90 (mL/min)  CBC     Status: Abnormal   Collection Time   06/16/11 12:19 AM      Component Value Range   WBC 5.1  4.0 - 10.5 (K/uL)   RBC 2.89 (*) 3.87 - 5.11 (MIL/uL)   Hemoglobin 9.4 (*) 12.0 - 15.0 (g/dL)   HCT 11.9 (*) 14.7 - 46.0 (%)   MCV 102.1 (*) 78.0 - 100.0 (fL)   MCH 32.5  26.0 - 34.0 (pg)   MCHC 31.9  30.0 - 36.0 (g/dL)   RDW 82.9  56.2 - 13.0 (%)   Platelets 91 (*) 150 - 400 (K/uL)  DIFFERENTIAL     Status: Normal   Collection Time   06/16/11 12:19 AM      Component Value Range   Neutrophils Relative 65  43 - 77 (%)   Neutro Abs 3.3  1.7 - 7.7 (K/uL)   Lymphocytes Relative 21  12 - 46 (%)   Lymphs Abs 1.1  0.7 - 4.0 (K/uL)   Monocytes Relative 9  3 - 12 (%)   Monocytes Absolute 0.5  0.1 - 1.0 (K/uL)   Eosinophils  Relative 4  0 - 5 (%)   Eosinophils Absolute 0.2  0.0 - 0.7 (K/uL)   Basophils Relative 1  0 - 1 (%)   Basophils Absolute 0.0  0.0 - 0.1 (K/uL)  APTT     Status: Abnormal   Collection Time   06/16/11 12:19 AM      Component Value Range   aPTT 135 (*) 24 - 37 (seconds)  PROTIME-INR     Status: Normal   Collection Time   06/16/11 12:19 AM      Component Value Range   Prothrombin Time 13.9  11.6 - 15.2 (seconds)   INR 1.05  0.00 - 1.49     EKG: NSR with anterior q waves and new anterolateral TWI CXR: pending  Assessment/Plan: 71 yo WF with CAD and recent LAD PCI, ESRD, and DM here with NSTEMI.  Pt is now pain free but with EKG changes as noted  above. 1. NSTEMI: low threshold to move to higher level of care if pain returns - ASA 325, plavix 75, heparin ggt - continue coreg and statin - continue other home BP meds - plan for LHC, suspect this could be related to known circumflex disease  2. ESRD and Hyperkalemia: - due for HD tomorrow AM, followed outpt by Dr. Fausto Skillern, will consult nephro - will give one dose of home kayexalate to temporize K, hold benicar until after HD - D51/2NS at 30cc/hr while NPO 3. HTN: adequately controlled currently - continue meds 4. DM2:  - lantus 10 daily - D5 as above while NPO to prevent worsening hyperkalemia 5. NPO for now, but will probably be able to hold off cath until Monday if her pain does not return  Angelee Bahr 06/16/2011, 3:05 AM

## 2011-06-16 NOTE — Progress Notes (Signed)
ANTICOAGULATION CONSULT NOTE - Initial Consult  Pharmacy Consult for Heparin  Indication: chest pain/ACS  Allergies  Allergen Reactions  . Other Nausea And Vomiting    NARCOTICS    Patient Measurements: Height: 5\' 4"  (162.6 cm) Weight: 202 lb 4.8 oz (91.763 kg) IBW/kg (Calculated) : 54.7  Heparin Dosing Weight: 75  Vital Signs: Temp: 98.9 F (37.2 C) (05/17 2315) Temp src: Oral (05/17 2315) BP: 166/82 mmHg (05/17 2315) Pulse Rate: 65  (05/17 2315)  Labs(at [facility]):  WBC 5.1 Hgb 10.0 Hct 31.0 Plt 85  SCr 6.47  INR 1.0 PTT 32.7   Medical History: Past Medical History  Diagnosis Date  . Coronary artery disease     catheterization March 2011.(presentation wtih mild CHF and mild increased troponin))..70% proximal/80% mid LAD,50% circumflex.Marland KitchenLAD could be approached but with difficulty....medicall therapy recommended/NSTEMI 01/31/2010 with sepsis and anemia...EF 50%...medical Rx  . Diabetes mellitus   . Hyperlipidemia   . Aortic stenosis     mild to moderate...echo.Marland KitchenMarland KitchenSeptember,2006/catheterization valve area 1.9cm square/mod/severe....echo...10/2009/mod/severe....echo....01/31/2010  . 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 Hypertension   . Anemia   . CVA (cerebral vascular accident)   . Chronic kidney disease     ESRD...dialysis  . Carotid artery disease     doppler 2011 /  doppler 05/08/2010 similar, <50% bilateral with some plaque  . Edema   . Ejection fraction     EF 50%, cardiac catheterization, January, 2012  Hemodialysis TTSat  Medications:  Norvasc  ASA  Benicar  Coreg  Clonidine  MVI  Cardura  Lasix  Hydralazine  Lantus  Prilosec  Aprida  Zinc  Plavix  Renagel  Kayexalate  Sensipar  Zocor  Assessment: 71 yo female with chest pain for Heparin.  Heparin 5000 units IV bolus, 1100 units/hr started at [facility] at 9 pm 5/17  Goal of Therapy:  Heparin level 0.3-0.7 units/ml Monitor platelets by  anticoagulation protocol: Yes   Plan:  Increase Heparin 1250 units/hr F/U am labs  Eddie Candle 06/16/2011,12:43 AM

## 2011-06-16 NOTE — Progress Notes (Signed)
Requesting for a diet . Pt is currently chest pain free and has not eaten since last hs. Pt is not currently having chest pain . Thanks Ancil Linsey RN

## 2011-06-16 NOTE — Progress Notes (Signed)
CRITICAL VALUE ALERT  Critical value received:  Troponin 2.33, and CKMB 9.6  Date of notification:  06/16/11  Time of notification:  1218 AM  Critical value read back: yes  Nurse who received alert:  Lenna Gilford  MD notified (1st page):  Dr. Charm Barges  Time of first page:  1218 AM  MD notified (2nd page):  Time of second page:  Responding MD:  Dr. Charm Barges  Time MD responded:  1218 AM

## 2011-06-16 NOTE — Consult Note (Signed)
Melissa [name] 06/16/2011 Myrene Bougher D Requesting Physician:  Dr. Meridee Score  Reason for Consult:  Need for dialysis and related services in pt with chest pain HPI: The patient is a 71 y.o. year-old with HTN, DM and ESRD on HD 5 yrs in [place] with [facility] on TTS schedule. She has CAD with MI x 3 in the past and recently had LAD PCI here in  Jan 2013. She presented with chest pain and EKG changes last night. Today she is currently pain-free on IV heparin drip. Cardiology is considering LHC due to known circumflex disease that was not previously treated.   Patient lives at home alone, has 2 children living nearby. Ambulates with walker or cane, has an aide for 4 hrs M-F. She denies any recent problems with SOB, F/C/S, wt loss, problems with access or dialysis. She goes to all her HD treatments.     Past Medical History:  Past Medical History  Diagnosis Date  . Coronary artery disease     catheterization March 2011.(presentation wtih mild CHF and mild increased troponin))..70% proximal/80% mid LAD,50% circumflex.Marland KitchenLAD could be approached but with difficulty....medicall therapy recommended/NSTEMI 01/31/2010 with sepsis and anemia...EF 50%...medical Rx,  . Diabetes mellitus   . Hyperlipidemia   . Aortic stenosis     mild to moderate...echo.Marland KitchenMarland KitchenSeptember,2006/catheterization valve area 1.9cm square/mod/severe....echo...10/2009/mod/severe....echo....01/31/2010,  . 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 Hypertension   . Anemia   . CVA (cerebral vascular accident)   . Chronic kidney disease     ESRD...dialysis  . Carotid artery disease     doppler 2009/07/21 /  doppler 05/08/2010 similar, <50% bilateral with some plaque  . Edema   . Ejection fraction     EF 50%, cardiac catheterization, January, 2012    Past Surgical History:  Past Surgical History  Procedure Date  . Appendectomy   . Oophorectomy   . Toe amputation     left great toe  . Retinal  detachment surgery   . Cataract extraction     Family History:  Family History  Problem Relation Age of Onset  . Coronary artery disease Mother     died age 48  . Diabetes type II Mother   . Other Father     died in 07/22/22 of accident  . Diabetes type II Daughter   . Hiatal hernia Son    Social History:  reports that she has never smoked. She has never used smokeless tobacco. She reports that she does not drink alcohol or use illicit drugs.  Allergies:  Allergies  Allergen Reactions  . Codeine Nausea And Vomiting    Home medications: Prior to Admission medications   Medication Sig Start Date End Date Taking? Authorizing Provider  amLODipine (NORVASC) 5 MG tablet Take 5 mg by mouth daily.   Yes Historical Provider, MD  aspirin EC 81 MG tablet Take 81 mg by mouth daily.   Yes Historical Provider, MD  carvedilol (COREG) 12.5 MG tablet Take 1 tablet (12.5 mg total) by mouth 2 (two) times daily. 02/19/11  Yes Luis Abed, MD  cinacalcet (SENSIPAR) 30 MG tablet Take 30 mg by mouth daily.    Yes Historical Provider, MD  cloNIDine (CATAPRES - DOSED IN MG/24 HR) 0.3 mg/24hr Place 1 patch onto the skin once a week.    Yes Historical Provider, MD  clopidogrel (PLAVIX) 75 MG tablet Take 75 mg by mouth daily.     Yes Historical Provider, MD  doxazosin (CARDURA)  8 MG tablet Take 8 mg by mouth at bedtime.   Yes Historical Provider, MD  furosemide (LASIX) 40 MG tablet Take 1 tablet (40 mg total) by mouth daily. 03/07/11 03/06/12 Yes Rhonda G Barrett, PA  hydrALAZINE (APRESOLINE) 50 MG tablet Take 50 mg by mouth 2 (two) times daily.     Yes Historical Provider, MD  insulin glargine (LANTUS) 100 UNIT/ML injection Inject 10 Units into the skin at bedtime. Patient does not take if blood sugar is 100 or below.   Yes Historical Provider, MD  insulin glulisine (APIDRA) 100 UNIT/ML injection Inject 5-15 Units into the skin 3 (three) times daily before meals. Sliding scale   Yes Historical Provider, MD    nitroGLYCERIN (NITROSTAT) 0.4 MG SL tablet Place 1 tablet (0.4 mg total) under the tongue every 5 (five) minutes as needed for chest pain. 03/07/11 03/06/12 Yes Rhonda G Barrett, PA  olmesartan (BENICAR) 40 MG tablet Take 40 mg by mouth daily.     Yes Historical Provider, MD  omeprazole (PRILOSEC) 20 MG capsule Take 20 mg by mouth daily.     Yes Historical Provider, MD  polyethylene glycol powder (GLYCOLAX/MIRALAX) powder Take 68 g by mouth 3 (three) times a week. Tuesday, Thursday, Sunday   Yes Historical Provider, MD  sevelamer (RENAGEL) 800 MG tablet Take 1,600-3,200 mg by mouth See admin instructions. Takes 3200 mg with meals and 1600mg  with each snack   Yes Historical Provider, MD  simvastatin (ZOCOR) 40 MG tablet Take 40 mg by mouth at bedtime.     Yes Historical Provider, MD  sodium polystyrene (KAYEXALATE) 15 GM/60ML suspension Take 15 g by mouth 2 (two) times a week. Patient takes 15 g on Saturday and Sunday.   Yes Historical Provider, MD    Inpatient medications:    . amLODipine  5 mg Oral Daily  . aspirin EC  325 mg Oral Daily  . carvedilol  12.5 mg Oral BID WC  . cinacalcet  30 mg Oral Q breakfast  . cloNIDine  0.3 mg Transdermal Weekly  . clopidogrel  75 mg Oral Q breakfast  . doxazosin  8 mg Oral QHS  . furosemide  40 mg Oral Daily  . hydrALAZINE  50 mg Oral BID  . insulin aspart  0-9 Units Subcutaneous TID WC  . insulin glargine  10 Units Subcutaneous QHS  . pantoprazole  40 mg Oral Q1200  . polyethylene glycol  68 g Oral Q M,W,F  . sevelamer  3,200 mg Oral TID WC  . simvastatin  40 mg Oral QHS  . sodium polystyrene  15 g Oral Custom  . DISCONTD: polyethylene glycol powder  68 g Oral 3 times weekly  . DISCONTD: sodium polystyrene  15 g Oral 2 times weekly    Review of Systems Gen:  Denies headache, fever, chills, sweats.  No weight loss. HEENT:  No visual change, sore throat, difficulty swallowing. Resp:  No difficulty breathing, DOE.  No cough or hemoptysis. Cardiac:   +chest pain as above.  Denies edema. GI:   Denies abdominal pain.   No nausea, vomiting, diarrhea.  No constipation. GU:  Denies difficulty or change in voiding.  No change in urine color.     MS:  Denies joint pain or swelling.   Derm:  Denies skin rash or itching.  No chronic skin conditions.  Neuro:   Denies focal weakness, memory problems, hx stroke or TIA.   Psych:  Denies symptoms of depression of anxiety.  No hallucination.  Physical Exam:  Blood pressure 146/83, pulse 57, temperature 98 F (36.7 C), temperature source Oral, resp. rate 18, height 5\' 4"  (1.626 m), weight 90.992 kg (200 lb 9.6 oz), SpO2 100.00%.  Gen: alert, obese, no distress, calm Skin: no rash, cyanosis Neck: no JVD, no bruits or LAN Chest: clear bilat Heart: regular, no rub or gallop, loud SEM 3/6 Abdomen: soft, nontender, obese, no HSM Ext: trace ankle edema bilat Neuro: alert, Ox3, no focal deficit Heme/Lymph: no bruising or LAN  Labs: Basic Metabolic Panel:  Lab 06/16/11 1610 06/16/11 0019  NA 138 139  K 6.2* 5.9*  CL 103 104  CO2 21 22  GLUCOSE 99 98  BUN 52* 50*  CREATININE 6.81* 6.58*  ALB -- --  CALCIUM 9.7 9.5  PHOS -- --   Liver Function Tests:  Lab 06/16/11 0615 06/16/11 0019  AST 21 19  ALT 11 10  ALKPHOS 99 97  BILITOT 0.2* 0.2*  PROT 6.0 5.9*  ALBUMIN 3.4* 3.5   No results found for this basename: LIPASE:3,AMYLASE:3 in the last 168 hours No results found for this basename: AMMONIA:3 in the last 168 hours CBC:  Lab 06/16/11 0615 06/16/11 0019  WBC 5.3 5.1  NEUTROABS -- 3.3  HGB 9.6* 9.4*  HCT 30.0* 29.5*  MCV 102.7* 102.1*  PLT 83* 91*   PT/INR: @labrcntip (inr:5) Cardiac Enzymes:  Lab 06/16/11 0615 06/16/11 0018  CKTOTAL 111 88  CKMB 12.9* 9.6*  CKMBINDEX -- --  TROPONINI 1.65* 2.33*   OUtpatient HD:  [facility], 3 hr 15 min, EDW 91 kg, 300/600, heparin 2000u then 400/hr, EPO 2200.  No vit D. L FA AVGG.   Assessment/Recommendations 1. Chest pain,  recent PCI to LAD- per primary. On hep gtt, probably LHC. 2. ESRD- due for HD today. She is at dry weight with good BP, will UF 2-3 kg as tolerated. No vol excess. D/C ivf's at 30.  3. DM 2- per primary 4. 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 Continue renagel 3ac and 2 w/ snacks, Sensipar 30/d.  6. Hx CVA 7. HTN- continue home BP meds (x 5)  Vinson Moselle  MD [facility] [phone] pgr    [phone] cell 06/16/2011, 8:11 AM

## 2011-06-17 ENCOUNTER — Encounter (HOSPITAL_COMMUNITY): Payer: Self-pay | Admitting: *Deleted

## 2011-06-17 DIAGNOSIS — I10 Essential (primary) hypertension: Secondary | ICD-10-CM

## 2011-06-17 DIAGNOSIS — N189 Chronic kidney disease, unspecified: Secondary | ICD-10-CM

## 2011-06-17 DIAGNOSIS — I359 Nonrheumatic aortic valve disorder, unspecified: Secondary | ICD-10-CM

## 2011-06-17 DIAGNOSIS — I214 Non-ST elevation (NSTEMI) myocardial infarction: Secondary | ICD-10-CM

## 2011-06-17 DIAGNOSIS — I509 Heart failure, unspecified: Secondary | ICD-10-CM

## 2011-06-17 LAB — HEPARIN LEVEL (UNFRACTIONATED)
Heparin Unfractionated: 0.66 IU/mL (ref 0.30–0.70)
Heparin Unfractionated: 0.6 IU/mL (ref 0.30–0.70)

## 2011-06-17 LAB — CBC
HCT: 30.6 % — ABNORMAL LOW (ref 36.0–46.0)
Hemoglobin: 9.6 g/dL — ABNORMAL LOW (ref 12.0–15.0)
RBC: 2.94 MIL/uL — ABNORMAL LOW (ref 3.87–5.11)
WBC: 5.9 10*3/uL (ref 4.0–10.5)

## 2011-06-17 LAB — BASIC METABOLIC PANEL
BUN: 33 mg/dL — ABNORMAL HIGH (ref 6–23)
Chloride: 100 mEq/L (ref 96–112)
Glucose, Bld: 68 mg/dL — ABNORMAL LOW (ref 70–99)
Potassium: 4.6 mEq/L (ref 3.5–5.1)
Sodium: 139 mEq/L (ref 135–145)

## 2011-06-17 LAB — GLUCOSE, CAPILLARY
Glucose-Capillary: 74 mg/dL (ref 70–99)
Glucose-Capillary: 116 mg/dL — ABNORMAL HIGH (ref 70–99)

## 2011-06-17 MED ORDER — SODIUM CHLORIDE 0.9 % IJ SOLN
3.0000 mL | Freq: Two times a day (BID) | INTRAMUSCULAR | Status: DC
  Administered 2011-06-17: 3 mL via INTRAVENOUS

## 2011-06-17 MED ORDER — SEVELAMER CARBONATE 800 MG PO TABS
3200.0000 mg | ORAL_TABLET | Freq: Three times a day (TID) | ORAL | Status: DC
  Administered 2011-06-17 – 2011-06-19 (×5): 3200 mg via ORAL
  Filled 2011-06-17 (×11): qty 4

## 2011-06-17 MED ORDER — SODIUM CHLORIDE 0.9 % IV SOLN: 250.0000 mL | INTRAVENOUS | Status: DC | PRN

## 2011-06-17 MED ORDER — SODIUM CHLORIDE 0.9 % IJ SOLN: 3.0000 mL | INTRAMUSCULAR | Status: DC | PRN

## 2011-06-17 NOTE — Progress Notes (Addendum)
 SUBJECTIVE: The patient is doing reasonably today.  She reports mild intermittent chest pain early this am, but is presently without chest pain.     . amLODipine  5 mg Oral Daily  . aspirin EC  325 mg Oral Daily  . atorvastatin  20 mg Oral QHS  . carvedilol  12.5 mg Oral BID WC  . cinacalcet  30 mg Oral Q breakfast  . cloNIDine  0.3 mg Transdermal Weekly  . clopidogrel  75 mg Oral Q breakfast  . doxazosin  8 mg Oral QHS  . furosemide  40 mg Oral Daily  . hydrALAZINE  50 mg Oral BID  . insulin aspart  0-9 Units Subcutaneous TID WC  . insulin glargine  10 Units Subcutaneous QHS  . pantoprazole  40 mg Oral Q1200  . polyethylene glycol  68 g Oral Q M,W,F  . sevelamer  3,200 mg Oral TID WC  . sodium polystyrene  15 g Oral Custom  . DISCONTD: sevelamer  3,200 mg Oral TID WC      . heparin 1,250 Units/hr (06/16/11 0103)    OBJECTIVE: Physical Exam: Filed Vitals:   06/16/11 1410 06/16/11 1739 06/16/11 2100 06/17/11 0500  BP: 132/59 133/49 154/70 162/74  Pulse: 70 72 65 62  Temp: 98.1 F (36.7 C)  99.5 F (37.5 C) 98.1 F (36.7 C)  TempSrc: Tympanic     Resp: 17  18 16  Height:      Weight: 199 lb 11.8 oz (90.6 kg)     SpO2: 100%  99% 97%    Intake/Output Summary (Last 24 hours) at 06/17/11 1008 Last data filed at 06/17/11 0800  Gross per 24 hour  Intake 554.38 ml  Output   1895 ml  Net -1340.62 ml    Telemetry reveals sinus rhythm  GEN- The patient is overweight appearing, alert and oriented x 3 today.   Head- normocephalic, atraumatic Eyes-  Sclera clear, conjunctiva pink Ears- hearing intact Oropharynx- clear Neck- supple, no JVP Lymph- no cervical lymphadenopathy Lungs- Clear to ausculation bilaterally, normal work of breathing Heart- Regular rate and rhythm, 2/6 SEM LUSB which is mid to late peaking GI- soft, NT, ND, + BS Extremities- no clubbing, cyanosis, or edema   LABS: Basic Metabolic Panel:  Basename 06/17/11 0510 06/16/11 0615  NA 139 138   K 4.6 6.2*  CL 100 103  CO2 27 21  GLUCOSE 68* 99  BUN 33* 52*  CREATININE 4.70* 6.81*  CALCIUM 9.4 9.7  MG -- --  PHOS -- --   Liver Function Tests:  Basename 06/16/11 0615 06/16/11 0019  AST 21 19  ALT 11 10  ALKPHOS 99 97  BILITOT 0.2* 0.2*  PROT 6.0 5.9*  ALBUMIN 3.4* 3.5   No results found for this basename: LIPASE:2,AMYLASE:2 in the last 72 hours CBC:  Basename 06/17/11 0510 06/16/11 0615 06/16/11 0019  WBC 5.9 5.3 --  NEUTROABS -- -- 3.3  HGB 9.6* 9.6* --  HCT 30.6* 30.0* --  MCV 104.1* 102.7* --  PLT 76* 83* --   Cardiac Enzymes:  Basename 06/16/11 1206 06/16/11 0615 06/16/11 0018  CKTOTAL 110 111 88  CKMB 11.7* 12.9* 9.6*  CKMBINDEX -- -- --  TROPONINI 3.34* 1.65* 2.33*   BNP: No components found with this basename: POCBNP:3 D-Dimer: No results found for this basename: DDIMER:2 in the last 72 hours Hemoglobin A1C:  Basename 06/16/11 0615  HGBA1C 5.5   Fasting Lipid Panel: No results found for this basename: CHOL,HDL,LDLCALC,TRIG,CHOLHDL,LDLDIRECT in   the last 72 hours Thyroid Function Tests: No results found for this basename: TSH,T4TOTAL,FREET3,T3FREE,THYROIDAB in the last 72 hours Anemia Panel: No results found for this basename: VITAMINB12,FOLATE,FERRITIN,TIBC,IRON,RETICCTPCT in the last 72 hours  RADIOLOGY: Dg Chest Port 1 View  06/16/2011  *RADIOLOGY REPORT*  Clinical Data: Effusions or infiltrates.  Shortness of breath. Chest pain.  PORTABLE CHEST - 1 VIEW  Comparison: Two-view chest 04/04/2009.  Findings: Mild cardiomegaly is again noted.  Mild pulmonary vascular congestion is now evident.  The lung volumes are low.  No focal airspace disease is evident.  The visualized soft tissues and bony thorax are unremarkable.  IMPRESSION:  1.  Mild cardiomegaly and pulmonary vascular congestion. 2.  No focal airspace disease.  Original Report Authenticated By: CHRISTOPHER W. MATTERN, M.D.   Echo from 1/13 reviewed  ASSESSMENT AND PLAN:  Principal  Problem:  *NSTEMI (non-ST elevated myocardial infarction)  70 yo WF with CAD and recent LAD PCI, ESRD, and DM here with NSTEMI. Pt is now pain free but has been intermittent since admission  1. NSTEMI: low threshold to move to higher level of care if pain returns  If she remains stable, then we will proceed with catheterization tomorrow. Risks, benefits, and alternatives to [facility] with possible PCI were discussed at length with the patient today who wishes to  Proceed. - ASA 325, plavix 75, heparin ggt  - continue coreg and statin  - continue other home BP meds   2. ESRD and Hyperkalemia:  - s/p HD yesterday K is improved  3. HTN: adequately controlled currently  - continue meds   4. DM2:  - lantus 10 daily   5. Aortic stenosis- at least moderate by exam,  Echo 1/13 reveals moderate AS  6. CHF- stable, fluid to be managed with HD.   Willona Phariss, MD 06/17/2011 10:08 AM  

## 2011-06-17 NOTE — Progress Notes (Signed)
ANTICOAGULATION CONSULT NOTE - Follow Up Consult  Pharmacy Consult for Heparin Indication: chest pain/ACS  Allergies  Allergen Reactions  . Codeine Nausea And Vomiting    Patient Measurements: Height: 5\' 4"  (162.6 cm) Weight: 199 lb 11.8 oz (90.6 kg) IBW/kg (Calculated) : 54.7  Heparin Dosing Weight: 75 kg  Vital Signs: Temp: 98.1 F (36.7 C) (05/19 0500) BP: 162/74 mmHg (05/19 0500) Pulse Rate: 62  (05/19 0500)  Labs:  Basename 06/17/11 0510 06/16/11 1206 06/16/11 0615 06/16/11 0019 06/16/11 0018  HGB 9.6* -- 9.6* -- --  HCT 30.6* -- 30.0* 29.5* --  PLT 76* -- 83* 91* --  APTT -- -- -- 135* --  LABPROT -- -- -- 13.9 --  INR -- -- -- 1.05 --  HEPARINUNFRC 0.66 -- 0.68 -- --  CREATININE 4.70* -- 6.81* 6.58* --  CKTOTAL -- 110 111 -- 88  CKMB -- 11.7* 12.9* -- 9.6*  TROPONINI -- 3.34* 1.65* -- 2.33*    Estimated Creatinine Clearance: 11.8 ml/min (by C-G formula based on Cr of 4.7).   Medications:  Infusions:    . heparin 1,250 Units/hr (06/16/11 0103)    Assessment: Chest Pain:  She remains therapeutic on Heparin.  Her platelet count has declined slightly from her baseline of ~90K.  Goal of Therapy:  Heparin level 0.3-0.7 units/ml Monitor platelets by anticoagulation protocol: Yes   Plan:  Continue Heparin at 1250 units/hr Continue to monitor platelet count with daily CBC Check Heparin level with AM Labs.  Estella Husk, Pharm.D., BCPS Clinical Pharmacist  Phone [phone] Pager [phone] 06/17/2011, 9:59 AM

## 2011-06-17 NOTE — Progress Notes (Signed)
Subjective:  Alert, up in chair, minimal CP. To prob have LHC soon tomorrow  Objective:    Vital signs in last 24 hours: Filed Vitals:   06/16/11 1739 06/16/11 2100 06/17/11 0500 06/17/11 1400  BP: 133/49 154/70 162/74 155/49  Pulse: 72 65 62 65  Temp:  99.5 F (37.5 C) 98.1 F (36.7 C) 97.9 F (36.6 C)  TempSrc:    Oral  Resp:  18 16 18   Height:      Weight:      SpO2:  99% 97% 99%   Weight change: -0.771 kg (-1 lb 11.2 oz)  Intake/Output Summary (Last 24 hours) at 06/17/11 1520 Last data filed at 06/17/11 0800  Gross per 24 hour  Intake 554.38 ml  Output      0 ml  Net 554.38 ml   Labs: Basic Metabolic Panel:  Lab 06/17/11 1914 06/16/11 0615 06/16/11 0019  NA 139 138 139  K 4.6 6.2* 5.9*  CL 100 103 104  CO2 27 21 22   GLUCOSE 68* 99 98  BUN 33* 52* 50*  CREATININE 4.70* 6.81* 6.58*  ALB -- -- --  CALCIUM 9.4 9.7 9.5  PHOS -- -- --   Liver Function Tests:  Lab 06/16/11 0615 06/16/11 0019  AST 21 19  ALT 11 10  ALKPHOS 99 97  BILITOT 0.2* 0.2*  PROT 6.0 5.9*  ALBUMIN 3.4* 3.5   No results found for this basename: LIPASE:3,AMYLASE:3 in the last 168 hours No results found for this basename: AMMONIA:3 in the last 168 hours CBC:  Lab 06/17/11 0510 06/16/11 0615 06/16/11 0019  WBC 5.9 5.3 5.1  NEUTROABS -- -- 3.3  HGB 9.6* 9.6* 9.4*  HCT 30.6* 30.0* 29.5*  MCV 104.1* 102.7* 102.1*  PLT 76* 83* 91*   Cardiac Enzymes:  Lab 06/16/11 1206 06/16/11 0615 06/16/11 0018  CKTOTAL 110 111 88  CKMB 11.7* 12.9* 9.6*  CKMBINDEX -- -- --  TROPONINI 3.34* 1.65* 2.33*   CBG:  Lab 06/17/11 1125 06/17/11 0730 06/16/11 2052 06/16/11 1657 06/16/11 0743  GLUCAP 116* 74 116* 128* 107*    Physical Exam:  Blood pressure 155/49, pulse 65, temperature 97.9 F (36.6 C), temperature source Oral, resp. rate 18, height 5\' 4"  (1.626 m), weight 90.6 kg (199 lb 11.8 oz), SpO2 99.00%.  Gen: alert, obese, no distress, calm  Skin: no rash, cyanosis  Neck: no JVD, no bruits  or LAN  Chest: clear bilat  Heart: regular, no rub or gallop, loud SEM 3/6  Abdomen: soft, nontender, obese, no HSM  Ext: trace ankle edema bilat  Neuro: alert, Ox3, no focal deficit  Heme/Lymph: no bruising or LAN   OUtpatient HD: [facility], 3 hr 15 min, EDW 91 kg, 300/600, heparin 2000u then 400/hr, EPO 2200. No vit D. L FA AVGG.    Assessment/Recommendations  1. Chest pain, recent PCI to LAD- per primary. On hep gtt, probable LHC tomorrow. 2. ESRD / [facility]- She is at dry weight with good BP.  No vol excess.  3. DM 2- per primary 4. 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 Continue renagel 3ac and 2 w/ snacks, Sensipar 30/d.  6. Hx CVA 7. HTN- continue home BP meds (x 5)   Melissa Moselle  MD [facility] [phone] pgr    [phone] cell 06/17/2011, 3:20 PM

## 2011-06-17 NOTE — Progress Notes (Signed)
ANTICOAGULATION CONSULT NOTE - Follow Up Consult  Pharmacy Consult for Heparin Indication: chest pain/ACS  Allergies  Allergen Reactions  . Codeine Nausea And Vomiting    Patient Measurements: Height: 5\' 4"  (162.6 cm) Weight: 199 lb 11.8 oz (90.6 kg) IBW/kg (Calculated) : 54.7  Heparin Dosing Weight: 75 kg  Vital Signs: Temp: 97.9 F (36.6 C) (05/19 1400) Temp src: Oral (05/19 1400) BP: 155/49 mmHg (05/19 1400) Pulse Rate: 65  (05/19 1400)  Labs:  Alvira Philips 06/17/11 2049 06/17/11 0510 06/16/11 1206 06/16/11 0615 06/16/11 0019 06/16/11 0018  HGB -- 9.6* -- 9.6* -- --  HCT -- 30.6* -- 30.0* 29.5* --  PLT -- 76* -- 83* 91* --  APTT -- -- -- -- 135* --  LABPROT -- -- -- -- 13.9 --  INR -- -- -- -- 1.05 --  HEPARINUNFRC 0.60 0.66 -- 0.68 -- --  CREATININE -- 4.70* -- 6.81* 6.58* --  CKTOTAL -- -- 110 111 -- 88  CKMB -- -- 11.7* 12.9* -- 9.6*  TROPONINI -- -- 3.34* 1.65* -- 2.33*    Estimated Creatinine Clearance: 11.8 ml/min (by C-G formula based on Cr of 4.7).   Medications:  Infusions:     . heparin 1,250 Units/hr (06/17/11 1734)    Assessment: Earlier this evening RN reported to PA and pharmacist that patient has had some oozing at the LUE fistula site today. RN reports soaking of two 4X4 dressing over ~4 hrs recently. PA notifed and ordered STAT heparin level and request pharmacy to adjust heparin down.  STAT heparin level = 0.6 on 1250 units/hr. We will reduce goal for heparin level to low end of therapeutic range (0.3-0.5).    Her platelet count is 76K has declined slightly from her baseline of ~90K.  Goal of Therapy:  Reduce Heparin level goal to 0.3-5 units/ml Monitor platelets by anticoagulation protocol: Yes   Plan:  Decrease Heparin to 1100 units/hr Continue to monitor platelet count with daily CBC Check Heparin level with AM Labs.  Noah Delaine, RPh Clinical Pharmacist 06/17/2011, 9:37 PM

## 2011-06-18 ENCOUNTER — Encounter (HOSPITAL_COMMUNITY)
Admission: AD | Disposition: A | Discharge status: Home / Self Care | Payer: Self-pay | Source: Other Acute Inpatient Hospital | Attending: Internal Medicine

## 2011-06-18 DIAGNOSIS — I2510 Atherosclerotic heart disease of native coronary artery without angina pectoris: Secondary | ICD-10-CM

## 2011-06-18 DIAGNOSIS — I214 Non-ST elevation (NSTEMI) myocardial infarction: Secondary | ICD-10-CM

## 2011-06-18 HISTORY — PX: PERCUTANEOUS CORONARY STENT INTERVENTION (PCI-S): SHX5485

## 2011-06-18 HISTORY — PX: LEFT HEART CATHETERIZATION WITH CORONARY ANGIOGRAM: SHX5451

## 2011-06-18 LAB — GLUCOSE, CAPILLARY: Glucose-Capillary: 149 mg/dL — ABNORMAL HIGH (ref 70–99)

## 2011-06-18 LAB — HEPARIN LEVEL (UNFRACTIONATED): Heparin Unfractionated: 0.45 IU/mL (ref 0.30–0.70)

## 2011-06-18 LAB — BASIC METABOLIC PANEL
BUN: 49 mg/dL — ABNORMAL HIGH (ref 6–23)
Calcium: 9.3 mg/dL (ref 8.4–10.5)
Creatinine, Ser: 6.38 mg/dL — ABNORMAL HIGH (ref 0.50–1.10)
GFR calc Af Amer: 7 mL/min — ABNORMAL LOW (ref >90)
GFR calc non Af Amer: 6 mL/min — ABNORMAL LOW (ref >90)
Glucose, Bld: 97 mg/dL (ref 70–99)
Potassium: 4.4 mEq/L (ref 3.5–5.1)

## 2011-06-18 LAB — CBC
HCT: 29.4 % — ABNORMAL LOW (ref 36.0–46.0)
Hemoglobin: 9.4 g/dL — ABNORMAL LOW (ref 12.0–15.0)
MCHC: 32 g/dL (ref 30.0–36.0)
MCV: 101.7 fL — ABNORMAL HIGH (ref 78.0–100.0)
RDW: 14.9 % (ref 11.5–15.5)
WBC: 5.2 10*3/uL (ref 4.0–10.5)

## 2011-06-18 SURGERY — LEFT HEART CATHETERIZATION WITH CORONARY ANGIOGRAM
Anesthesia: LOCAL

## 2011-06-18 MED ORDER — AMLODIPINE BESYLATE 5 MG PO TABS
ORAL_TABLET | ORAL | Status: AC
  Filled 2011-06-18: qty 1

## 2011-06-18 MED ORDER — FENTANYL CITRATE 0.05 MG/ML IJ SOLN
INTRAMUSCULAR | Status: AC
  Filled 2011-06-18: qty 2

## 2011-06-18 MED ORDER — NITROGLYCERIN 0.2 MG/ML ON CALL CATH LAB
INTRAVENOUS | Status: AC
  Filled 2011-06-18: qty 1

## 2011-06-18 MED ORDER — LIDOCAINE HCL (PF) 1 % IJ SOLN
INTRAMUSCULAR | Status: AC
  Filled 2011-06-18: qty 30

## 2011-06-18 MED ORDER — SODIUM CHLORIDE 0.9 % IV SOLN: INTRAVENOUS | Status: DC

## 2011-06-18 MED ORDER — HEPARIN (PORCINE) IN NACL 2-0.9 UNIT/ML-% IJ SOLN
INTRAMUSCULAR | Status: AC
  Filled 2011-06-18: qty 2000

## 2011-06-18 MED ORDER — ACETAMINOPHEN 325 MG PO TABS
ORAL_TABLET | ORAL | Status: AC
  Filled 2011-06-18: qty 2

## 2011-06-18 MED ORDER — MIDAZOLAM HCL 2 MG/2ML IJ SOLN
INTRAMUSCULAR | Status: AC
  Filled 2011-06-18: qty 2

## 2011-06-18 MED ORDER — HEPARIN (PORCINE) IN NACL 2-0.9 UNIT/ML-% IJ SOLN
INTRAMUSCULAR | Status: AC
  Filled 2011-06-18: qty 1000

## 2011-06-18 NOTE — Progress Notes (Signed)
Pt with continued oozing to fistula site but oozing not increasing from earlier observation.  Pt states she "feels fine." outer ABD pad dry and intact but 4x4 stained.  Will continue to monitor.

## 2011-06-18 NOTE — H&P (View-Only) (Signed)
SUBJECTIVE: The patient is doing reasonably today.  She reports mild intermittent chest pain early this am, but is presently without chest pain.     Marland Kitchen amLODipine  5 mg Oral Daily  . aspirin EC  325 mg Oral Daily  . atorvastatin  20 mg Oral QHS  . carvedilol  12.5 mg Oral BID WC  . cinacalcet  30 mg Oral Q breakfast  . cloNIDine  0.3 mg Transdermal Weekly  . clopidogrel  75 mg Oral Q breakfast  . doxazosin  8 mg Oral QHS  . furosemide  40 mg Oral Daily  . hydrALAZINE  50 mg Oral BID  . insulin aspart  0-9 Units Subcutaneous TID WC  . insulin glargine  10 Units Subcutaneous QHS  . pantoprazole  40 mg Oral Q1200  . polyethylene glycol  68 g Oral Q M,W,F  . sevelamer  3,200 mg Oral TID WC  . sodium polystyrene  15 g Oral Custom  . DISCONTD: sevelamer  3,200 mg Oral TID WC      . heparin 1,250 Units/hr (06/16/11 0103)    OBJECTIVE: Physical Exam: Filed Vitals:   06/16/11 1410 06/16/11 1739 06/16/11 2100 06/17/11 0500  BP: 132/59 133/49 154/70 162/74  Pulse: 70 72 65 62  Temp: 98.1 F (36.7 C)  99.5 F (37.5 C) 98.1 F (36.7 C)  TempSrc: Tympanic     Resp: 17  18 16   Height:      Weight: 199 lb 11.8 oz (90.6 kg)     SpO2: 100%  99% 97%    Intake/Output Summary (Last 24 hours) at 06/17/11 1008 Last data filed at 06/17/11 0800  Gross per 24 hour  Intake 554.38 ml  Output   1895 ml  Net -1340.62 ml    Telemetry reveals sinus rhythm  GEN- The patient is overweight appearing, alert and oriented x 3 today.   Head- normocephalic, atraumatic Eyes-  Sclera clear, conjunctiva pink Ears- hearing intact Oropharynx- clear Neck- supple, no JVP Lymph- no cervical lymphadenopathy Lungs- Clear to ausculation bilaterally, normal work of breathing Heart- Regular rate and rhythm, 2/6 SEM LUSB which is mid to late peaking GI- soft, NT, ND, + BS Extremities- no clubbing, cyanosis, or edema   LABS: Basic Metabolic Panel:  Basename 06/17/11 0510 06/16/11 0615  NA 139 138   K 4.6 6.2*  CL 100 103  CO2 27 21  GLUCOSE 68* 99  BUN 33* 52*  CREATININE 4.70* 6.81*  CALCIUM 9.4 9.7  MG -- --  PHOS -- --   Liver Function Tests:  Basename 06/16/11 0615 06/16/11 0019  AST 21 19  ALT 11 10  ALKPHOS 99 97  BILITOT 0.2* 0.2*  PROT 6.0 5.9*  ALBUMIN 3.4* 3.5   No results found for this basename: LIPASE:2,AMYLASE:2 in the last 72 hours CBC:  Basename 06/17/11 0510 06/16/11 0615 06/16/11 0019  WBC 5.9 5.3 --  NEUTROABS -- -- 3.3  HGB 9.6* 9.6* --  HCT 30.6* 30.0* --  MCV 104.1* 102.7* --  PLT 76* 83* --   Cardiac Enzymes:  Basename 06/16/11 1206 06/16/11 0615 06/16/11 0018  CKTOTAL 110 111 88  CKMB 11.7* 12.9* 9.6*  CKMBINDEX -- -- --  TROPONINI 3.34* 1.65* 2.33*   BNP: No components found with this basename: POCBNP:3 D-Dimer: No results found for this basename: DDIMER:2 in the last 72 hours Hemoglobin A1C:  Basename 06/16/11 0615  HGBA1C 5.5   Fasting Lipid Panel: No results found for this basename: CHOL,HDL,LDLCALC,TRIG,CHOLHDL,LDLDIRECT in  the last 72 hours Thyroid Function Tests: No results found for this basename: TSH,T4TOTAL,FREET3,T3FREE,THYROIDAB in the last 72 hours Anemia Panel: No results found for this basename: VITAMINB12,FOLATE,FERRITIN,TIBC,IRON,RETICCTPCT in the last 72 hours  RADIOLOGY: Dg Chest Port 1 View  06/16/2011  *RADIOLOGY REPORT*  Clinical Data: Effusions or infiltrates.  Shortness of breath. Chest pain.  PORTABLE CHEST - 1 VIEW  Comparison: Two-view chest 04/04/2009.  Findings: Mild cardiomegaly is again noted.  Mild pulmonary vascular congestion is now evident.  The lung volumes are low.  No focal airspace disease is evident.  The visualized soft tissues and bony thorax are unremarkable.  IMPRESSION:  1.  Mild cardiomegaly and pulmonary vascular congestion. 2.  No focal airspace disease.  Original Report Authenticated By: Jamesetta Orleans. MATTERN, M.D.   Echo from 1/13 reviewed  ASSESSMENT AND PLAN:  Principal  Problem:  *NSTEMI (non-ST elevated myocardial infarction)  71 yo WF with CAD and recent LAD PCI, ESRD, and DM here with NSTEMI. Pt is now pain free but has been intermittent since admission  1. NSTEMI: low threshold to move to higher level of care if pain returns  If she remains stable, then we will proceed with catheterization tomorrow. Risks, benefits, and alternatives to [facility] with possible PCI were discussed at length with the patient today who wishes to  Proceed. - ASA 325, plavix 75, heparin ggt  - continue coreg and statin  - continue other home BP meds   2. ESRD and Hyperkalemia:  - s/p HD yesterday K is improved  3. HTN: adequately controlled currently  - continue meds   4. DM2:  - lantus 10 daily   5. Aortic stenosis- at least moderate by exam,  Echo 1/13 reveals moderate AS  6. CHF- stable, fluid to be managed with HD.   [name], MD 06/17/2011 10:08 AM

## 2011-06-18 NOTE — Progress Notes (Signed)
Pt saturated 2nd dressing of 2 4x4's and 2 abd pads at LLA fistula.  Dressing that was applied @ 0235 currently saturated and replaced. Pt with IV heparin running at ordered rate. Oozing worsening.  BP 104/72 HR 60 Pt. Without complaints at this time.  MD notified of worsening oozing at this time and orders received for morning labs to be drawn stat and to hold pressure to site.  Will continue to monitor patient.

## 2011-06-18 NOTE — Progress Notes (Signed)
Pt LLA dialysis fistula assessed noted to be oozing per shift report.  Dressing saturated and changed.  Pt on IV heparin.  Cardiology PA and pharmacy notified of oozing.  Order received for stat heparin level and to continue dressings with 4x4's over fistula.  Will continue to monitor site.  Site + for both bruit and thrill.

## 2011-06-18 NOTE — Interval H&P Note (Signed)
History and Physical Interval Note:  06/18/2011 7:47 AM  Melissa [name]  has presented today for surgery, with the diagnosis of cp  The various methods of treatment have been discussed with the patient and family. After consideration of risks, benefits and other options for treatment, the patient has consented to  Procedure(s) (LRB): LEFT HEART CATHETERIZATION WITH CORONARY ANGIOGRAM (N/A) as a surgical intervention .  The patients' history has been reviewed, patient examined, no change in status, stable for surgery.  I have reviewed the patients' chart and labs.  Questions were answered to the patient's satisfaction.     Theron Arista [facility] 06/18/2011 7:48 AM

## 2011-06-18 NOTE — CV Procedure (Signed)
Cardiac Catheterization Procedure Note  Name: Melissa [name] MRN: [id number] DOB: [birth year]  Procedure: Left Heart Cath, Selective Coronary Angiography,  PTCA/Stent of the proximal RCA and PTCA of the mid LAD   Indication: 71 year old white female with history of coronary disease status post stenting of the mid LAD in February 2013 with drug-eluting stents. She presents with a non-ST elevation myocardial infarction. She has a history of end-stage renal disease, morbid obesity, hypertension, and diabetes.   Diagnostic Procedure Details: The right groin was prepped, draped, and anesthetized with 1% lidocaine. Using the modified Seldinger technique, a 5 French sheath was introduced into the right femoral artery. Standard Judkins catheters were used for selective coronary angiography and left ventriculography. Catheter exchanges were performed over a wire.  The diagnostic procedure was well-tolerated without immediate complications.  PROCEDURAL FINDINGS Hemodynamics: AO 181/64 with a mean of 111 mmHg LV 200/33 mmHg By pullback there was a 10 mm aortic valve gradient.  Coronary angiography: Coronary dominance: right  Left mainstem: There is mild calcification of the proximal left main with less than 10% irregularity.  Left anterior descending (LAD): There is a stented segment in the mid LAD. Within this segment the septal perforator arises and is a fairly large septal perforator. Within the LAD stented segment there is a severe focal in-stent restenosis of 95%. This immediately follows the first septal perforator. The segment is angulated.  Left circumflex (LCx): There is a 60-70% ostial left circumflex lesion. This is followed by 60-70% stenosis in the mid vessel after the takeoff of the first marginal branch.  Right coronary artery (RCA): The right coronary is a very large dominant vessel. It is severely calcified in the proximal vessel. On initial shot there were diffuse irregularities in  the proximal vessel less than 20%. There was a 50% stenosis at the takeoff of the PDA. With the second image of the right coronary there was evidence of a proximal dissection was significant lumen compromise. At this point the patient became hypotensive and bradycardic. She complained of left shoulder and jaw pain ECG showed mild ST elevation in leads 3 and aVF with ST-T wave depression in anterolateral leads.   PCI Procedure Note:  Following the diagnostic procedure, the decision was made to proceed with PCI of the acute RCA dissection. The sheath was upsized to a 6 [place]. Weight-based heparin was given for anticoagulation. Once a therapeutic ACT was achieved, a 6 [place] FR4 guide catheter was inserted.  A pro-water coronary guidewire was used to cross the lesion.  The lesion was predilated with a 3.0 mm balloon.  The lesion was then stented with a 4.0 x 24 mm Promus element stent covering the ostium of the right coronary.  The stent was postdilated with a 4.0 mm noncompliant balloon to 16 atmospheres.  Following PCI, there was 0% residual stenosis and TIMI-3 flow. Final angiography confirmed an excellent result.  We then proceeded with PCI of the mid LAD for in-stent restenosis. A 6 French XB LAD 3.5 guide catheter was inserted. A pro-water coronary guidewire was used to cross the lesion with moderate difficulty. The lesion was treated with balloon angioplasty using a 3.0 x 15 mm noncompliant balloon up to 18 atmospheres. Following PCI, there was less than 10% residual stenosis and TIMI grade 3 flow. Final angiography confirmed an excellent result. Femoral hemostasis was achieved with an Angio-Seal device.  The patient tolerated the PCI procedure well. There were no immediate procedural complications.  The patient was transferred to the post  catheterization recovery area for further monitoring.  PCI Data: Vessel #1- RCA/Segment - proximal Percent Stenosis (pre)  95% TIMI-flow 2 Stent 4.0 x 24 mm Promus  element Percent Stenosis (post) 0% TIMI-flow (post) 3  Vessel #2-LAD/segment-mid in-stent restenosis Percent stenosis (pre-) 95% TIMI flow 3 PTCA only Percent stenosis (post) less than 10% TIMI flow (post) 3  Final Conclusions:   1. Two-vessel obstructive atherosclerotic coronary disease. Patient had severe in-stent restenosis of the mid LAD. There was moderate disease of the left circumflex at the ostium and in the mid vessel. During the procedure the patient developed a spontaneous dissection of the proximal right coronary with hemodynamic compromise. 2. Elevated left ventricular filling pressures 3. Successful intracoronary stenting of the proximal right coronary with a DES. Successful balloon angioplasty of the mid LAD for in-stent restenosis.  Recommendations: Continue dual antiplatelet therapy for at least one year.  Mischele Detter [place] 06/18/2011, 9:21 AM

## 2011-06-18 NOTE — Progress Notes (Signed)
[facility] Progress Note  Subjective:  Back from cath lab after PTCA/Stent of the proximal RCA and PTCA of the mid LAD  Reports oozing from dialysis access cannulation site since she removed dressings yesterday - saturated a gauze 4X4 - more brisk since heparin in the cath lab   Objective Filed Vitals:   06/17/11 1400 06/17/11 2100 06/18/11 0500 06/18/11 0739  BP: 155/49 152/66 160/91   Pulse: 65 64 61 69  Temp: 97.9 F (36.6 C) 98.6 F (37 C) 98 F (36.7 C)   TempSrc: Oral     Resp: 18 16 16    Height:      Weight:   90.6 kg (199 lb 11.8 oz)   SpO2: 99% 100% 97%    Physical Exam BP 160/91  Pulse 69  Temp(Src) 98 F (36.7 C) (Oral)  Resp 16  Ht 5\' 4"  (1.626 m)  Wt 90.6 kg (199 lb 11.8 oz)  BMI 34.28 kg/m2  SpO2 97% General:Very nice WF  NAD  Nurse holding pressure on access site Heart:Loud 3/6 murmur usb to apex heard entire precordium Lungs:clear Abdomen:soft without tenderness Extremities:no sig edema Dialysis Access: left arm AVG with oozing from prev cannulation site - slowing down (per RN)  Additional Objective Labs: Basic Metabolic Panel:  Lab 06/18/11 2130 06/17/11 0510 06/16/11 0615  NA 134* 139 138  K 4.4 4.6 6.2*  CL 96 100 103  CO2 26 27 21   GLUCOSE 97 68* 99  BUN 49* 33* 52*  CREATININE 6.38* 4.70* 6.81*  CALCIUM 9.3 9.4 9.7  ALB -- -- --  PHOS -- -- --   Liver Function Tests:  Lab 06/16/11 0615 06/16/11 0019  AST 21 19  ALT 11 10  ALKPHOS 99 97  BILITOT 0.2* 0.2*  PROT 6.0 5.9*  ALBUMIN 3.4* 3.5   Lab 06/18/11 0537 06/17/11 0510 06/16/11 0615 06/16/11 0019  WBC 5.2 5.9 5.3 --  NEUTROABS -- -- -- 3.3  HGB 9.4* 9.6* 9.6* --  HCT 29.4* 30.6* 30.0* --  MCV 101.7* 104.1* 102.7* 102.1*  PLT 73* 76* 83* --   Blood Culture    Component Value Date/Time   SDES BLOOD RIGHT HAND 02/19/2007 0028   SPECREQUEST BOTTLES DRAWN AEROBIC AND ANAEROBIC 6CC 02/19/2007 0028   CULT NO GROWTH 5 DAYS 02/19/2007 0028   REPTSTATUS [id number]  FINAL 02/19/2007 0028    Cardiac Enzymes:  Lab 06/16/11 1206 06/16/11 0615 06/16/11 0018  CKTOTAL 110 111 88  CKMB 11.7* 12.9* 9.6*  CKMBINDEX -- -- --  TROPONINI 3.34* 1.65* 2.33*   CBG:  Lab 06/18/11 0940 06/18/11 0723 06/17/11 2110 06/17/11 1627 06/17/11 1125  GLUCAP 98 96 116* 121* 116*  Medications:    . sodium chloride    . DISCONTD: heparin 1,100 Units/hr (06/17/11 2152)    . amLODipine  5 mg Oral Daily  . aspirin EC  325 mg Oral Daily  . atorvastatin  20 mg Oral QHS  . carvedilol  12.5 mg Oral BID WC  . cinacalcet  30 mg Oral Q breakfast  . cloNIDine  0.3 mg Transdermal Weekly  . clopidogrel  75 mg Oral Q breakfast  . doxazosin  8 mg Oral QHS  . fentaNYL      . furosemide  40 mg Oral Daily  . heparin      . heparin      . hydrALAZINE  50 mg Oral BID  . insulin aspart  0-9 Units Subcutaneous TID WC  . insulin glargine  10 Units Subcutaneous QHS  . lidocaine      . midazolam      . nitroGLYCERIN      . pantoprazole  40 mg Oral Q1200  . polyethylene glycol  68 g Oral Q M,W,F  . sevelamer  3,200 mg Oral TID WC  . sodium polystyrene  15 g Oral Custom  . DISCONTD: sodium chloride  3 mL Intravenous Q12H    I  have reviewed scheduled and prn medications. 1. Chest pain s/p intervention to mid LAD in stent stenosis and proximal RCA - per cards dual antiplatelet RX for a year 2. ESRD / TTS [facility]- She is at dry weight with good BP. No vol excess. Oozing from AVG all weekend (on heparin plus low plts but says usually no probs with post HD bleeding) Try gelfoam. If does not stop will ask [facility] to see. HD tomorrow per usual schedule; stop oral lasix - not sure why on 3. DM 2- per primary 4. 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 Continue renagel 3ac and 2 w/ snacks, Sensipar 30/d.  6. Hx CVA 7. HTN- continue home BP meds (x 5)    @MBSIG @  06/18/2011,1:59 PM  LOS: 3 days

## 2011-06-19 ENCOUNTER — Inpatient Hospital Stay (HOSPITAL_COMMUNITY): Payer: Medicare Other

## 2011-06-19 DIAGNOSIS — D696 Thrombocytopenia, unspecified: Secondary | ICD-10-CM | POA: Diagnosis present

## 2011-06-19 LAB — CBC
HCT: 26.6 % — ABNORMAL LOW (ref 36.0–46.0)
Hemoglobin: 8.6 g/dL — ABNORMAL LOW (ref 12.0–15.0)
MCH: 32.7 pg (ref 26.0–34.0)
MCV: 101.1 fL — ABNORMAL HIGH (ref 78.0–100.0)
Platelets: 79 10*3/uL — ABNORMAL LOW (ref 150–400)
RBC: 2.63 MIL/uL — ABNORMAL LOW (ref 3.87–5.11)
WBC: 5.9 10*3/uL (ref 4.0–10.5)

## 2011-06-19 LAB — RENAL FUNCTION PANEL
BUN: 58 mg/dL — ABNORMAL HIGH (ref 6–23)
CO2: 25 mEq/L (ref 19–32)
Chloride: 98 mEq/L (ref 96–112)
Creatinine, Ser: 7.7 mg/dL — ABNORMAL HIGH (ref 0.50–1.10)
GFR calc non Af Amer: 5 mL/min — ABNORMAL LOW (ref >90)
Glucose, Bld: 95 mg/dL (ref 70–99)

## 2011-06-19 MED ORDER — CLOPIDOGREL BISULFATE 75 MG PO TABS: 75.0000 mg | ORAL_TABLET | Freq: Every day | ORAL | Status: DC

## 2011-06-19 NOTE — Progress Notes (Signed)
CARDIAC REHAB PHASE I   PRE:  Rate/Rhythm: 70 SR    BP: sitting 142/78    SaO2:   MODE:  Ambulation: 80 ft   POST:  Rate/Rhythm: 79    BP: sitting 148/61     SaO2:   Doing well except c/o right outside of knee hurting. Sts its a new pain "I have a new pain every day". Reviewed ed. Has RW at home. [id number]  Harriet Masson CES, ACSM

## 2011-06-19 NOTE — Progress Notes (Signed)
Patient Name: Melissa [name] Date of Encounter: 06/19/2011  Principal Problem:  *NSTEMI (non-ST elevated myocardial infarction) Active Problems:  Anemia in chronic kidney disease  Chronic kidney disease  Thrombocytopenia    SUBJECTIVE: No chest pain, no SOB, wants to go home after HD.   OBJECTIVE Filed Vitals:   06/18/11 2000 06/18/11 2100 06/18/11 2200 06/18/11 2311  BP: 135/45 124/42 131/42 146/51  Pulse:  70 63 69  Temp: 98.7 F (37.1 C)   99.7 F (37.6 C)  TempSrc: Oral   Oral  Resp: 16   18  Height:      Weight:      SpO2: 95% 91% 96% 93%    Intake/Output Summary (Last 24 hours) at 06/19/11 0704 Last data filed at 06/19/11 0300  Gross per 24 hour  Intake    420 ml  Output      0 ml  Net    420 ml   Weight change:  Filed Weights   06/16/11 1022 06/16/11 1410 06/18/11 0500  Weight: 203 lb 0.7 oz (92.1 kg) 199 lb 11.8 oz (90.6 kg) 199 lb 11.8 oz (90.6 kg)   PHYSICAL EXAM General: Well developed, well nourished, female in no acute distress. Head: Normocephalic, atraumatic.  Neck: Supple without bruits, JVD about 8 cm. Lungs:  Resp regular and unlabored, rales bilateral bases. Heart: RRR, S1, S2, no S3, S4, 2-3/6 aortic stenosis murmur. Abdomen: Soft, non-tender, non-distended, BS + x 4.  Extremities: No clubbing, cyanosis, no edema. Cath site without hematoma  Neuro: Alert and oriented X 3. Moves all extremities spontaneously. Psych: Normal affect.  LABS: CBC: Basename 06/19/11 0353 06/18/11 0537  WBC 5.9 5.2  NEUTROABS -- --  HGB 8.6* 9.4*  HCT 26.6* 29.4*  MCV 101.1* 101.7*  PLT 79* 73*   Basic Metabolic Panel: Basename 06/19/11 0330 06/18/11 0537  NA 137 134*  K 4.9 4.4  CL 98 96  CO2 25 26  GLUCOSE 95 97  BUN 58* 49*  CREATININE 7.70* 6.38*  CALCIUM 9.3 9.3  MG -- --  PHOS 5.8* --   Liver Function Tests: Basename 06/19/11 0330  AST --  ALT --  ALKPHOS --  BILITOT --  PROT --  ALBUMIN 3.0*   Cardiac Enzymes: Basename 06/16/11  1206  CKTOTAL 110  CKMB 11.7*  CKMBINDEX --  TROPONINI 3.34*    TELE:  SR, occ PVCs and pairs.  Radiology/Studies: Dg Chest Port 1 View 06/16/2011  *RADIOLOGY REPORT*  Clinical Data: Effusions or infiltrates.  Shortness of breath. Chest pain.  PORTABLE CHEST - 1 VIEW  Comparison: Two-view chest 04/04/2009.  Findings: Mild cardiomegaly is again noted.  Mild pulmonary vascular congestion is now evident.  The lung volumes are low.  No focal airspace disease is evident.  The visualized soft tissues and bony thorax are unremarkable.  IMPRESSION:  1.  Mild cardiomegaly and pulmonary vascular congestion. 2.  No focal airspace disease.  Original Report Authenticated By: Jamesetta Orleans. MATTERN, M.D.    Current Medications:     . amLODipine  5 mg Oral Daily  . aspirin EC  325 mg Oral Daily  . atorvastatin  20 mg Oral QHS  . carvedilol  12.5 mg Oral BID WC  . cinacalcet  30 mg Oral Q breakfast  . cloNIDine  0.3 mg Transdermal Weekly  . clopidogrel  75 mg Oral Q breakfast  . doxazosin  8 mg Oral QHS  . fentaNYL      . heparin      .  heparin      . hydrALAZINE  50 mg Oral BID  . insulin aspart  0-9 Units Subcutaneous TID WC  . insulin glargine  10 Units Subcutaneous QHS  . lidocaine      . midazolam      . nitroGLYCERIN      . pantoprazole  40 mg Oral Q1200  . polyethylene glycol  68 g Oral Q M,W,F  . sevelamer  3,200 mg Oral TID WC  . sodium polystyrene  15 g Oral Custom  . DISCONTD: furosemide  40 mg Oral Daily  . DISCONTD: sodium chloride  3 mL Intravenous Q12H      . sodium chloride    . DISCONTD: heparin 1,100 Units/hr (06/17/11 2152)    ASSESSMENT AND PLAN: Principal Problem:  *NSTEMI (non-ST elevated myocardial infarction) -  4.0 x 24 mm Promus element stent to the RCA with PTCA LAD.  DAPT x 1 year  Active Problems:  Anemia in chronic kidney disease - per renal team   Chronic kidney disease - HD today, per renal   Thrombocytopenia - HD site was oozing but finally  controlled, dressing in place and not removed.  HTN - She was on Lasix 40 mg and Benicar 40 mg daily PTA, MD advise on resumption of these meds.   Plan - possible d/c today if pt doing well after HD.  Signed, Theodore Demark , PA-C 7:04 AM 06/19/2011 Patient seen and examined and history reviewed. Agree with above findings and plan. Doing well today without any chest pain or SOB. HD site left arm no longer oozing. Cath site looks good. Exam reveals harsh 3/6 systolic murmur of aortic stenosis. Lungs are clear. Ecg shows no change in anterolateral T wave inversion. Hgb dropped slightly due to cath and oozing from HD site. OK for discharge today following HD. Needs follow up with Dr. Myrtis Ser in [place] in 2 weeks. OK to resume Benicar from my standpoint. Not sure of need for Lasix since she is on dialysis.  Theron Arista [facility] 06/19/2011 10:26 AM

## 2011-06-19 NOTE — Discharge Summary (Signed)
CARDIOLOGY DISCHARGE SUMMARY   Patient ID: Melissa [name] MRN: [id number] DOB/AGE: 1940-03-30 71 y.o.  Admit date: 06/15/2011 Discharge date: 06/19/2011  Primary Discharge Diagnosis:  NSTEMI Secondary Discharge Diagnosis:  Past Medical History  Diagnosis Date  . Coronary artery disease     catheterization March 2011.(presentation wtih mild CHF and mild increased troponin))..70% proximal/80% mid LAD,50% circumflex.Marland KitchenLAD could be approached but with difficulty....medicall therapy recommended/NSTEMI 01/31/2010 with sepsis and anemia...EF 50%...medical Rx,  . Diabetes mellitus   . Hyperlipidemia   . Aortic stenosis     mild to moderate...echo.Marland KitchenMarland KitchenSeptember,2006/catheterization valve area 1.9cm square/mod/severe....echo...10/2009/mod/severe....echo....01/31/2010,  . 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 Hypertension   . Anemia   . CVA (cerebral vascular accident)   . Chronic kidney disease     ESRD...dialysis  . Carotid artery disease     doppler 2011 /  doppler 05/08/2010 similar, <50% bilateral with some plaque  . Edema   . Ejection fraction     EF 50%, cardiac catheterization, January, 2012    Consults: Nephrology  Procedures: Left Heart Cath, Selective Coronary Angiography, PTCA/Stent of the proximal RCA and PTCA of the mid LAD, HD  Hospital Course: Melissa [name] is a 71 year old female with a history of CAD. She went to [facility] with chest pain and was transferred to [facility] for further evaluation and treatment.   Her enzymes elevated, indicating a NSTEMI. A renal consult was called and she was dialyzed on her usual schedule. She had oozing at the HD site for > 24 hours felt secondary to a combination of thrombocytopenia and anticoagulation but this was finally brought under control. She was taken to the cath lab on 5/20, with results described below. She tolerated the procedure well.   On 5/21, she was seen by cardiac rehab, by the  renal team and by Dr [place]. Her hemoglobin had decreased but this is felt secondary to procedures and HD site oozing. This will be managed by the renal team. Her medications were reviewed and she was continued on all blood pressure medications except the Lasix as she is a dialysis patient. Once she completes dialysis today, she is considered stable for discharge, to follow up as an outpatient.  Labs:  Lab Results  Component Value Date   WBC 5.9 06/19/2011   HGB 8.6* 06/19/2011   HCT 26.6* 06/19/2011   MCV 101.1* 06/19/2011   PLT 79* 06/19/2011    Lab 06/19/11 0330 06/16/11 0615  NA 137 --  K 4.9 --  CL 98 --  CO2 25 --  BUN 58* --  CREATININE 7.70* --  CALCIUM 9.3 --  PROT -- 6.0  BILITOT -- 0.2*  ALKPHOS -- 99  ALT -- 11  AST -- 21  GLUCOSE 95 --    Basename 06/16/11 1206  CKTOTAL 110  CKMB 11.7*  CKMBINDEX --  TROPONINI 3.34*   Radiology: Dg Chest Port 1 View 06/16/2011  *RADIOLOGY REPORT*  Clinical Data: Effusions or infiltrates.  Shortness of breath. Chest pain.  PORTABLE CHEST - 1 VIEW  Comparison: Two-view chest 04/04/2009.  Findings: Mild cardiomegaly is again noted.  Mild pulmonary vascular congestion is now evident.  The lung volumes are low.  No focal airspace disease is evident.  The visualized soft tissues and bony thorax are unremarkable.  IMPRESSION:  1.  Mild cardiomegaly and pulmonary vascular congestion. 2.  No focal airspace disease.  Original Report Authenticated By: Jamesetta Orleans. MATTERN, M.D.  Cardiac Cath:  PCI Data: Vessel #1- RCA/Segment - proximal Percent Stenosis (pre)  95% TIMI-flow 2 Stent 4.0 x 24 mm Promus element Percent Stenosis (post) 0% TIMI-flow (post) 3 Vessel #2-LAD/segment-mid in-stent restenosis Percent stenosis (pre-) 95% TIMI flow 3 PTCA only Percent stenosis (post) less than 10% TIMI flow (post) 3 Left mainstem: There is mild calcification of the proximal left main with less than 10% irregularity. Left anterior descending  (LAD): There is a stented segment in the mid LAD. Within this segment the septal perforator arises and is a fairly large septal perforator. Within the LAD stented segment there is a severe focal in-stent restenosis of 95%. This immediately follows the first septal perforator. The segment is angulated. Left circumflex (LCx): There is a 60-70% ostial left circumflex lesion. This is followed by 60-70% stenosis in the mid vessel after the takeoff of the first marginal branch. Right coronary artery (RCA): The right coronary is a very large dominant vessel. It is severely calcified in the proximal vessel. On initial shot there were diffuse irregularities in the proximal vessel less than 20%. There was a 50% stenosis at the takeoff of the PDA. With the second image of the right coronary there was evidence of a proximal dissection was significant lumen compromise. At this point the patient became hypotensive and bradycardic. She complained of left shoulder and jaw pain ECG showed mild ST elevation in leads 3 and aVF with ST-T wave depression in anterolateral leads.  EKG: SR, no ST elevation   FOLLOW UP PLANS AND APPOINTMENTS  Allergies  Allergen Reactions  . Codeine Nausea And Vomiting   Follow-up Information    Follow up with Willa Rough, MD. (The office will call.)    Contact information:   [address], [address] [place], [place] [phone]       Follow up with Donzetta Sprung, MD. (As needed)    Contact information:   [address] [place] [address] [phone]          Medication List  As of 06/19/2011  1:52 PM   STOP taking these medications         furosemide 40 MG tablet         TAKE these medications         amLODipine 5 MG tablet   Commonly known as: NORVASC   Take 5 mg by mouth daily.      aspirin EC 81 MG tablet   Take 81 mg by mouth daily.      carvedilol 12.5 MG tablet   Commonly known as: COREG   Take 1 tablet (12.5 mg total) by mouth 2 (two) times  daily.      cinacalcet 30 MG tablet   Commonly known as: SENSIPAR   Take 30 mg by mouth daily.      cloNIDine 0.3 mg/24hr   Commonly known as: CATAPRES - Dosed in mg/24 hr   Place 1 patch onto the skin once a week.      clopidogrel 75 MG tablet   Commonly known as: PLAVIX   Take 1 tablet (75 mg total) by mouth daily.      doxazosin 8 MG tablet   Commonly known as: CARDURA   Take 8 mg by mouth at bedtime.      hydrALAZINE 50 MG tablet   Commonly known as: APRESOLINE   Take 50 mg by mouth 2 (two) times daily.      insulin glargine 100 UNIT/ML injection  Commonly known as: LANTUS   Inject 10 Units into the skin at bedtime. Patient does not take if blood sugar is 100 or below.      insulin glulisine 100 UNIT/ML injection   Commonly known as: APIDRA   Inject 5-15 Units into the skin 3 (three) times daily before meals. Sliding scale      nitroGLYCERIN 0.4 MG SL tablet   Commonly known as: NITROSTAT   Place 1 tablet (0.4 mg total) under the tongue every 5 (five) minutes as needed for chest pain.      olmesartan 40 MG tablet   Commonly known as: BENICAR   Take 40 mg by mouth daily.      omeprazole 20 MG capsule   Commonly known as: PRILOSEC   Take 20 mg by mouth daily.      polyethylene glycol powder powder   Commonly known as: GLYCOLAX/MIRALAX   Take 68 g by mouth 3 (three) times a week. Tuesday, Thursday, Sunday      sevelamer 800 MG tablet   Commonly known as: RENAGEL   Take 1,600-3,200 mg by mouth See admin instructions. Takes 3200 mg with meals and 1600mg  with each snack      simvastatin 40 MG tablet   Commonly known as: ZOCOR   Take 40 mg by mouth at bedtime.      sodium polystyrene 15 GM/60ML suspension   Commonly known as: KAYEXALATE   Take 15 g by mouth 2 (two) times a week. Patient takes 15 g on Saturday and Sunday.           BRING ALL MEDICATIONS WITH YOU TO FOLLOW UP APPOINTMENTS  Time spent with patient to include physician time: 40  min Signed: Theodore Demark 06/19/2011, 7:13 AM Co-Sign MD

## 2011-06-19 NOTE — Discharge Summary (Signed)
Patient seen and examined and history reviewed. Agree with above findings and plan. See rounding note from earlier today.  Thedora Hinders 06/19/2011 2:37 PM

## 2011-06-19 NOTE — Discharge Instructions (Signed)
PLEASE REMEMBER TO BRING ALL OF YOUR MEDICATIONS TO EACH OF YOUR FOLLOW-UP OFFICE VISITS.  PLEASE ATTEND ALL SCHEDULED FOLLOW-UP APPOINTMENTS.   Activity: Increase activity slowly as tolerated. You may shower, but no soaking baths (or swimming) for 1 week. No driving for 2 days. No lifting over 5 lbs for 1 week. No sexual activity for 1 week.   You May Return to Work: in 1 week (if applicable)  Wound Care: You may wash cath site gently with soap and water. Keep cath site clean and dry. If you notice pain, swelling, bleeding or pus at your cath site, please call [phone].    Groin Site Care Refer to this sheet in the next few weeks. These instructions provide you with information on caring for yourself after your procedure. Your caregiver may also give you more specific instructions. Your treatment has been planned according to current medical practices, but problems sometimes occur. Call your caregiver if you have any problems or questions after your procedure. HOME CARE INSTRUCTIONS  You may shower 24 hours after the procedure. Remove the bandage (dressing) and gently wash the site with plain soap and water. Gently pat the site dry.   Do not apply powder or lotion to the site.   Do not sit in a bathtub, swimming pool, or whirlpool for 5 to 7 days.   No bending, squatting, or lifting anything over 10 pounds (4.5 kg) as directed by your caregiver.   Inspect the site at least twice daily.   Do not drive home if you are discharged the same day of the procedure. Have someone else drive you.   You may drive 24 hours after the procedure unless otherwise instructed by your caregiver.  What to expect:  Any bruising will usually fade within 1 to 2 weeks.   Blood that collects in the tissue (hematoma) may be painful to the touch. It should usually decrease in size and tenderness within 1 to 2 weeks.  SEEK IMMEDIATE MEDICAL CARE IF:  You have unusual pain at the groin site or down the  affected leg.   You have redness, warmth, swelling, or pain at the groin site.   You have drainage (other than a small amount of blood on the dressing).   You have chills.   You have a fever or persistent symptoms for more than 72 hours.   You have a fever and your symptoms suddenly get worse.   Your leg becomes pale, cool, tingly, or numb.   You have heavy bleeding from the site. Hold pressure on the site.  Document Released: 02/17/2010 Document Revised: 01/04/2011 Document Reviewed: 02/17/2010 [facility] Patient Information 2012 [place], [place]. Coronary Angiography with Stent This is a procedure to widen or open a narrow blood vessel of the heart (coronary artery). When a coronary artery becomes partially blocked it decreases blood flow to that area. This may lead to chest pain or a heart attack (myocardial infarction). Arteries may become blocked by cholesterol buildup (plaque) in the lining or wall. A stent is a small piece of metal that looks like a mesh or a spring. Stent placement may be done right after an angiogram that finds a blocked artery or as a treatment for a heart attack. RISKS AND COMPLICATIONS  Damage to the heart.   A blockage may return.   Bleeding at the site.   Blood clot to another part of the body.  PROCEDURE  You may be given a medication to help you relax before and  during the procedure through an IV in your hand or arm.   A local anesthetic to make the area numb may be used before inserting the catheter (a long, hollow tube about the size of a piece of cooked spaghetti).   You will be prepared for the procedure by washing and shaving the area where the catheter will be inserted. This is usually done in the groin.   A specially trained doctor will insert the catheter with a guide wire into an artery. This is guided under a special type of X-ray (fluoroscopy) to the opening of the blocked artery.   Special dye is then injected and X-rays are taken.    A tiny wire is guided to the blocked spot and a balloon is inflated to make the artery wider. The stent is expanded and crushes the plaque into the wall of the vessel. The stent holds the area open like a scaffolding and improves the blood flow.   Sometimes the artery may be made wider using a laser or other tools to remove plaque.   When the blood flow is better, the catheter is removed. The lining of the artery will grow over the stent which stays where it was placed.  AFTER THE PROCEDURE  You will stay in bed for several hours.   The access site will be watched and you will be checked frequently.   Blood tests, X-rays and an EKG may be done.   You may stay in the hospital overnight for observation.  SEEK IMMEDIATE MEDICAL CARE IF:   You develop chest pain, shortness of breath, feel faint, or pass out.   There is bleeding, swelling, or drainage from the catheter insertion site.   You develop pain, discoloration, coldness, or severe bruising in the leg or arm that held the catheter.   You see blood in your urine or stool. This may be bright red blood in urine or stools, or also appear as black, tarry stools.   You have a fever.  Document Released: 07/22/2002 Document Revised: 01/04/2011 Document Reviewed: 03/14/2007 [facility] Patient Information 2012 [place], [place].Groin Site Care Refer to this sheet in the next few weeks. These instructions provide you with information on caring for yourself after your procedure. Your caregiver may also give you more specific instructions. Your treatment has been planned according to current medical practices, but problems sometimes occur. Call your caregiver if you have any problems or questions after your procedure. HOME CARE INSTRUCTIONS  You may shower 24 hours after the procedure. Remove the bandage (dressing) and gently wash the site with plain soap and water. Gently pat the site dry.   Do not apply powder or lotion to the site.   Do not  sit in a bathtub, swimming pool, or whirlpool for 5 to 7 days.   No bending, squatting, or lifting anything over 10 pounds (4.5 kg) as directed by your caregiver.   Inspect the site at least twice daily.   Do not drive home if you are discharged the same day of the procedure. Have someone else drive you.   You may drive 24 hours after the procedure unless otherwise instructed by your caregiver.  What to expect:  Any bruising will usually fade within 1 to 2 weeks.   Blood that collects in the tissue (hematoma) may be painful to the touch. It should usually decrease in size and tenderness within 1 to 2 weeks.  SEEK IMMEDIATE MEDICAL CARE IF:  You have unusual pain at the  groin site or down the affected leg.   You have redness, warmth, swelling, or pain at the groin site.   You have drainage (other than a small amount of blood on the dressing).   You have chills.   You have a fever or persistent symptoms for more than 72 hours.   You have a fever and your symptoms suddenly get worse.   Your leg becomes pale, cool, tingly, or numb.   You have heavy bleeding from the site. Hold pressure on the site.  Document Released: 02/17/2010 Document Revised: 01/04/2011 Document Reviewed: 02/17/2010 [facility] Patient Information 2012 [place], [place].

## 2011-06-19 NOTE — Progress Notes (Signed)
[facility] Progress Note  Subjective:  Up walking in the hall with PT No chest pain or SOB No further oozing from HD access after application of Gelfoam to the site yesterday  Objective Filed Vitals:   06/18/11 2100 06/18/11 2200 06/18/11 2311 06/19/11 0700  BP: 124/42 131/42 146/51 142/78  Pulse: 70 63 69 65  Temp:   99.7 F (37.6 C) 98.6 F (37 C)  TempSrc:   Oral Oral  Resp:   18 18  Height:      Weight:      SpO2: 91% 96% 93% 94%  BP 142/78  Pulse 65  Temp(Src) 98.6 F (37 C) (Oral)  Resp 18  Ht 5\' 4"  (1.626 m)  Wt 90.6 kg (199 lb 11.8 oz)  BMI 34.28 kg/m2  SpO2 94% Wt Readings from Last 3 Encounters:  06/18/11 90.6 kg (199 lb 11.8 oz)   Physical Exam General: Older WF NAD A little pale Heart:Loud murmur no rub Lungs:clear Abdomen:soft Extremities:Left AVG with dry dressing in place and no oozing; thrill and bruit present Dialysis Access:As above  Additional Objective Labs: Basic Metabolic Panel:  Lab 06/19/11 1191 06/18/11 0537 06/17/11 0510  NA 137 134* 139  K 4.9 4.4 4.6  CL 98 96 100  CO2 25 26 27   GLUCOSE 95 97 68*  BUN 58* 49* 33*  CREATININE 7.70* 6.38* 4.70*  CALCIUM 9.3 9.3 9.4  ALB -- -- --  PHOS 5.8* -- --   Liver Function Tests:  Lab 06/19/11 0330 06/16/11 0615 06/16/11 0019  AST -- 21 19  ALT -- 11 10  ALKPHOS -- 99 97  BILITOT -- 0.2* 0.2*  PROT -- 6.0 5.9*  ALBUMIN 3.0* 3.4* 3.5   Lab 06/19/11 0353 06/18/11 0537 06/17/11 0510 06/16/11 0615 06/16/11 0019  WBC 5.9 5.2 5.9 -- --  NEUTROABS -- -- -- -- 3.3  HGB 8.6* 9.4* 9.6* -- --  HCT 26.6* 29.4* 30.6* -- --  MCV 101.1* 101.7* 104.1* 102.7* 102.1*  PLT 79* 73* 76* -- --  CBG:  Lab 06/19/11 0737 06/18/11 2124 06/18/11 1759 06/18/11 0940 06/18/11 0723  GLUCAP 97 149* 107* 98 96   Iron Studies: No results found for this basename: IRON,TIBC,TRANSFERRIN,FERRITIN in the last 72 hours Studies/Results: No results found. Medications: . sodium chloride    .  DISCONTD: heparin 1,100 Units/hr (06/17/11 2152)   . amLODipine  5 mg Oral Daily  . aspirin EC  325 mg Oral Daily  . atorvastatin  20 mg Oral QHS  . carvedilol  12.5 mg Oral BID WC  . cinacalcet  30 mg Oral Q breakfast  . cloNIDine  0.3 mg Transdermal Weekly  . clopidogrel  75 mg Oral Q breakfast  . doxazosin  8 mg Oral QHS  . heparin      . hydrALAZINE  50 mg Oral BID  . insulin aspart  0-9 Units Subcutaneous TID WC  . insulin glargine  10 Units Subcutaneous QHS  . pantoprazole  40 mg Oral Q1200  . polyethylene glycol  68 g Oral Q M,W,F  . sevelamer  3,200 mg Oral TID WC  . sodium polystyrene  15 g Oral Custom  . DISCONTD: furosemide  40 mg Oral Daily  . DISCONTD: sodium chloride  3 mL Intravenous Q12H    I  have reviewed scheduled and prn medications.  ASSESSMENT/RECS:  1. Chest pain/ NSTEMI -  s/p intervention to mid LAD in stent stenosis and proximal RCA - per cards dual antiplatelet  RX for a year 2. ESRD / TTS [facility] Rx- HD today. Oozing from AVG all weekend (on heparin plus low plts but says usually no probs with post HD bleeding) Tried gelfoam with good success.  Advised HD nurses to cannulate away from that site with HD today. 3. DM 2- per primary 4. 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 Continue renagel 3ac and 2 w/ snacks, Sensipar 30/d.  6. Hx CVA 7. HTN- continue home BP meds (x 5) 8. Dispo - per cards possible D/C if does OK with HD   06/19/2011,8:11 AM  LOS: 4 days

## 2011-07-03 ENCOUNTER — Other Ambulatory Visit: Payer: Self-pay | Admitting: Cardiology

## 2011-07-26 ENCOUNTER — Encounter: Payer: Self-pay | Admitting: Cardiology

## 2011-07-27 ENCOUNTER — Ambulatory Visit (INDEPENDENT_AMBULATORY_CARE_PROVIDER_SITE_OTHER): Payer: Medicare Other | Admitting: Cardiology

## 2011-07-27 ENCOUNTER — Encounter: Payer: Self-pay | Admitting: Cardiology

## 2011-07-27 VITALS — BP 129/82 | HR 64 | Ht 64.0 in | Wt 202.4 lb

## 2011-07-27 DIAGNOSIS — E785 Hyperlipidemia, unspecified: Secondary | ICD-10-CM

## 2011-07-27 DIAGNOSIS — I2510 Atherosclerotic heart disease of native coronary artery without angina pectoris: Secondary | ICD-10-CM

## 2011-07-27 DIAGNOSIS — I509 Heart failure, unspecified: Secondary | ICD-10-CM

## 2011-07-27 DIAGNOSIS — I350 Nonrheumatic aortic (valve) stenosis: Secondary | ICD-10-CM

## 2011-07-27 DIAGNOSIS — I359 Nonrheumatic aortic valve disorder, unspecified: Secondary | ICD-10-CM

## 2011-07-27 DIAGNOSIS — I214 Non-ST elevation (NSTEMI) myocardial infarction: Secondary | ICD-10-CM

## 2011-07-27 DIAGNOSIS — I10 Essential (primary) hypertension: Secondary | ICD-10-CM

## 2011-07-27 NOTE — Assessment & Plan Note (Signed)
Patient is on many medications. Her blood pressure is controlled.

## 2011-07-27 NOTE — Assessment & Plan Note (Signed)
Her lipids are being treated. No change in therapy. 

## 2011-07-27 NOTE — Progress Notes (Signed)
HPI  Patient is seen for cardiology followup. I had seen her last in April, 2013. At that time she was status post a PCI in February, 2013. Also at that time very careful attention was paid to the gradient across her aortic valve. It was felt that her aortic stenosis was only moderate. She has high output because of her AV fistula. Unfortunately she had more coronary problems in May, 2013. She developed chest pain and was admitted. Ultimately she did require a stent. She's been stable since that time. She's not having any recurrent chest pain. She is tolerating dialysis.  As part of today's evaluation I have reviewed her hospitalization data from her intervention in May, 2013. I've completely updated the records.  Allergies  Allergen Reactions  . Codeine Nausea And Vomiting    Current Outpatient Prescriptions  Medication Sig Dispense Refill  . aspirin EC 81 MG tablet Take 81 mg by mouth daily.      . carvedilol (COREG) 12.5 MG tablet Take 1 tablet (12.5 mg total) by mouth 2 (two) times daily.  60 tablet  6  . cinacalcet (SENSIPAR) 30 MG tablet Take 30 mg by mouth daily.       . cloNIDine (CATAPRES - DOSED IN MG/24 HR) 0.3 mg/24hr Place 1 patch onto the skin once a week.       . clopidogrel (PLAVIX) 75 MG tablet Take 1 tablet (75 mg total) by mouth daily.  30 tablet  11  . doxazosin (CARDURA) 8 MG tablet Take 8 mg by mouth at bedtime.      . hydrALAZINE (APRESOLINE) 50 MG tablet Take 50 mg by mouth 2 (two) times daily.        . insulin glargine (LANTUS) 100 UNIT/ML injection Inject 10 Units into the skin at bedtime. Patient does not take if blood sugar is 100 or below.      . insulin glulisine (APIDRA) 100 UNIT/ML injection Inject 5-15 Units into the skin 3 (three) times daily before meals. Sliding scale      . Multiple Vitamin (DAILY VITAMIN FORMULA PO) Take 3 mg by mouth daily. Folic acid- vitamin b complex - vitamin c - selenium - zinc 3 mg tablet.      . nitroGLYCERIN (NITROSTAT) 0.4 MG  SL tablet Place 1 tablet (0.4 mg total) under the tongue every 5 (five) minutes as needed for chest pain.  25 tablet  3  . NORVASC 5 MG tablet TAKE 1 TABLET ONCE DAILY.  30 each  3  . olmesartan (BENICAR) 40 MG tablet Take 40 mg by mouth daily.        Marland Kitchen omeprazole (PRILOSEC) 20 MG capsule Take 20 mg by mouth daily.        . polyethylene glycol powder (GLYCOLAX/MIRALAX) powder Take 68 g by mouth 3 (three) times a week. Tuesday, Thursday, Sunday      . sevelamer (RENAGEL) 800 MG tablet Take 1,600-3,200 mg by mouth See admin instructions. Takes 3200 mg with meals and 1600mg  with each snack      . simvastatin (ZOCOR) 40 MG tablet Take 40 mg by mouth at bedtime.        . sodium polystyrene (KAYEXALATE) 15 GM/60ML suspension Take 15 g by mouth 2 (two) times a week. Patient takes 15 g on Saturday and Sunday.        History   Social History  . Marital Status: Divorced    Spouse Name: N/A    Number of Children: N/A  .  Years of Education: N/A   Occupational History  . Not on file.   Social History Main Topics  . Smoking status: Never Smoker   . Smokeless tobacco: Never Used  . Alcohol Use: No  . Drug Use: No  . Sexually Active: No   Other Topics Concern  . Not on file   Social History Narrative   Lives in [place] alone. Divorced and has four children.Has home health aide 5 days/week    Family History  Problem Relation Age of Onset  . Coronary artery disease Mother     died age 93  . Diabetes type II Mother   . Other Father     died in 07/24/2022 of accident  . Diabetes type II Daughter   . Hiatal hernia Son     Past Medical History  Diagnosis Date  . Coronary artery disease     catheterization March 2011.(presentation wtih mild CHF and mild increased troponin))..70% proximal/80% mid LAD,50% circumflex.Marland KitchenLAD could be approached but with difficulty....medicall therapy recommended/NSTEMI 01/31/2010 with sepsis and anemia...EF 50%...medical Rx,  . Diabetes mellitus   . Hyperlipidemia   .  Aortic stenosis     mild to moderate...echo.Marland KitchenMarland KitchenSeptember,2006/catheterization valve area 1.9cm square/mod/severe....echo...10/2009/mod/severe....echo....01/31/2010,  . 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 Hypertension   . Anemia   . CVA (cerebral vascular accident)   . Chronic kidney disease     ESRD...dialysis  . Carotid artery disease     doppler 07-23-09 /  doppler 05/08/2010 similar, <50% bilateral with some plaque  . Edema   . Ejection fraction     EF 50%, cardiac catheterization, January, 2012    Past Surgical History  Procedure Date  . Appendectomy   . Oophorectomy   . Toe amputation     left great toe  . Retinal detachment surgery   . Cataract extraction     ROS  Patient denies fever, chills, headache, sweats, rash, change in vision, change in hearing, chest pain, cough, nausea vomiting, urinary symptoms. All other systems are reviewed and are negative.  PHYSICAL EXAM  Patient is oriented to person time and place. She is very pleasant lady. She is overweight. Lungs are clear. Respiratory effort is nonlabored. Cardiac exam reveals S1-S2. There is a systolic crescendo decrescendo murmur compatible with aortic stenosis. It is her to her back and into her neck. The abdomen is obese. There is no significant peripheral edema at this time. There are no skin rashes.   Filed Vitals:   07/27/11 1309  BP: 129/82  Height: 5\' 4"  (1.626 m)  Weight: 202 lb 6.4 oz (91.808 kg)     ASSESSMENT & PLAN

## 2011-07-27 NOTE — Assessment & Plan Note (Signed)
Her volume status is being controlled with dialysis. She is also very careful about her fluid and salt intake.

## 2011-07-27 NOTE — Assessment & Plan Note (Signed)
The patient had interventions in May, 2013. She is stable. She is on all appropriate medications. No change in therapy.

## 2011-07-27 NOTE — Patient Instructions (Addendum)
Your physician recommends that you schedule a follow-up appointment in: 3 month with Dr. Myrtis Ser.  Your physician recommends that you continue on your current medications as directed. Please refer to the Current Medication list given to you today.

## 2011-07-27 NOTE — Assessment & Plan Note (Signed)
The patient has significant aortic stenosis. Her echo is have suggested moderately severe. Careful catheterization data in February, 2013 reveals only moderate aortic stenosis. Her murmur is harsh. I will follow this carefully. She does not need an echo now.

## 2011-10-02 ENCOUNTER — Other Ambulatory Visit: Payer: Self-pay | Admitting: Cardiology

## 2011-10-17 ENCOUNTER — Encounter: Payer: Self-pay | Admitting: Cardiology

## 2011-10-17 ENCOUNTER — Ambulatory Visit (INDEPENDENT_AMBULATORY_CARE_PROVIDER_SITE_OTHER): Payer: Medicare Other | Admitting: Cardiology

## 2011-10-17 VITALS — BP 151/71 | HR 60 | Ht 64.0 in | Wt 204.0 lb

## 2011-10-17 DIAGNOSIS — I359 Nonrheumatic aortic valve disorder, unspecified: Secondary | ICD-10-CM

## 2011-10-17 DIAGNOSIS — I350 Nonrheumatic aortic (valve) stenosis: Secondary | ICD-10-CM

## 2011-10-17 DIAGNOSIS — I10 Essential (primary) hypertension: Secondary | ICD-10-CM

## 2011-10-17 DIAGNOSIS — I2510 Atherosclerotic heart disease of native coronary artery without angina pectoris: Secondary | ICD-10-CM

## 2011-10-17 NOTE — Assessment & Plan Note (Signed)
The patient's murmur is high pitched. However most recently she had only moderate aortic stenosis. This will have to be followed carefully over time.

## 2011-10-17 NOTE — Progress Notes (Signed)
HPI  Patient is seen for followup of her coronary disease and aortic stenosis. She is stable. She's dialyze regularly. She had a PCI in February, 2013. At that time very careful attention was paid to her aortic valve. She has a very high pitched murmur but her aortic stenosis is only moderate.  Allergies  Allergen Reactions  . Codeine Nausea And Vomiting    Current Outpatient Prescriptions  Medication Sig Dispense Refill  . aspirin EC 81 MG tablet Take 81 mg by mouth daily.      . cinacalcet (SENSIPAR) 30 MG tablet Take 30 mg by mouth daily.       . cloNIDine (CATAPRES - DOSED IN MG/24 HR) 0.3 mg/24hr Place 1 patch onto the skin once a week.       . clopidogrel (PLAVIX) 75 MG tablet Take 1 tablet (75 mg total) by mouth daily.  30 tablet  11  . COREG 12.5 MG tablet TAKE (1) TABLET TWICE DAILY.  60 each  6  . doxazosin (CARDURA) 8 MG tablet Take 8 mg by mouth at bedtime.      . hydrALAZINE (APRESOLINE) 50 MG tablet Take 50 mg by mouth 2 (two) times daily.        . insulin glargine (LANTUS) 100 UNIT/ML injection Inject 10 Units into the skin at bedtime. Patient does not take if blood sugar is 100 or below.      . insulin glulisine (APIDRA) 100 UNIT/ML injection Inject 5-15 Units into the skin 3 (three) times daily before meals. Sliding scale      . Multiple Vitamin (DAILY VITAMIN FORMULA PO) Take 3 mg by mouth daily. Folic acid- vitamin b complex - vitamin c - selenium - zinc 3 mg tablet.      . nitroGLYCERIN (NITROSTAT) 0.4 MG SL tablet Place 1 tablet (0.4 mg total) under the tongue every 5 (five) minutes as needed for chest pain.  25 tablet  3  . NORVASC 5 MG tablet TAKE 1 TABLET ONCE DAILY.  30 each  3  . olmesartan (BENICAR) 40 MG tablet Take 40 mg by mouth daily.        Marland Kitchen omeprazole (PRILOSEC) 20 MG capsule Take 20 mg by mouth daily.        . polyethylene glycol powder (GLYCOLAX/MIRALAX) powder Take 68 g by mouth 3 (three) times a week. Tuesday, Thursday, Sunday      . sevelamer  (RENAGEL) 800 MG tablet Take 1,600-3,200 mg by mouth See admin instructions. Takes 3200 mg with meals and 1600mg  with each snack      . simvastatin (ZOCOR) 40 MG tablet Take 40 mg by mouth at bedtime.        . sodium polystyrene (KAYEXALATE) 15 GM/60ML suspension Take 15 g by mouth 2 (two) times a week. Patient takes 15 g on Saturday and Sunday.        History   Social History  . Marital Status: Divorced    Spouse Name: N/A    Number of Children: N/A  . Years of Education: N/A   Occupational History  . Not on file.   Social History Main Topics  . Smoking status: Never Smoker   . Smokeless tobacco: Never Used  . Alcohol Use: No  . Drug Use: No  . Sexually Active: No   Other Topics Concern  . Not on file   Social History Narrative   Lives in [place] alone. Divorced and has four children.Has home health aide 5 days/week  Family History  Problem Relation Age of Onset  . Coronary artery disease Mother     died age 52  . Diabetes type II Mother   . Other Father     died in Jul 03, 2022 of accident  . Diabetes type II Daughter   . Hiatal hernia Son     Past Medical History  Diagnosis Date  . Coronary artery disease     catheterization March 2011.(presentation wtih mild CHF and mild increased troponin))..70% proximal/80% mid LAD,50% circumflex.Marland KitchenLAD could be approached but with difficulty....medicall therapy recommended/NSTEMI 01/31/2010 with sepsis and anemia...EF 50%...medical Rx,  . Diabetes mellitus   . Hyperlipidemia   . Aortic stenosis     mild to moderate...echo.Marland KitchenMarland KitchenSeptember,2006/catheterization valve area 1.9cm square/mod/severe....echo...10/2009/mod/severe....echo....01/31/2010,  . 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 Hypertension   . Anemia   . CVA (cerebral vascular accident)   . Chronic kidney disease     ESRD...dialysis  . Carotid artery disease     doppler 2009-07-02 /  doppler 05/08/2010 similar, <50% bilateral with some plaque    . Edema   . Ejection fraction     EF 50%, cardiac catheterization, January, 2012    Past Surgical History  Procedure Date  . Appendectomy   . Oophorectomy   . Toe amputation     left great toe  . Retinal detachment surgery   . Cataract extraction     ROS   The patient denies fever, chills, headache, sweats, rash, change in vision, change in hearing, chest pain, cough, nausea vomiting, urinary symptoms. All other systems are reviewed and are negative.  PHYSICAL EXAM    The patient is stable today. She is overweight. She is oriented to person time and place. Affect is normal. There is no jugular venous distention. Lungs are clear. Respiratory effort is nonlabored. Cardiac exam reveals S1 and S2. There is a 3/6 high pitched crescendo decrescendo systolic murmur compatible with her aortic stenosis. The abdomen is protuberant but soft. There is no significant peripheral edema  Filed Vitals:   10/17/11 1309  BP: 151/71  Pulse: 60  Height: 5\' 4"  (1.626 m)  Weight: 204 lb (92.534 kg)    EKG  ASSESSMENT & PLAN

## 2011-10-17 NOTE — Patient Instructions (Addendum)

## 2011-10-17 NOTE — Assessment & Plan Note (Signed)
The patient's coronary disease is stable. No further workup at this time. 

## 2011-10-17 NOTE — Assessment & Plan Note (Signed)
Today the patient systolic pressure is mildly elevated. He was not at the time of the last visit. She undergoes dialysis. On a regular basis she has to hold her medicines on the morning of dialysis. I feel it is most prudent not to change her medications at this time.

## 2011-10-19 ENCOUNTER — Ambulatory Visit (INDEPENDENT_AMBULATORY_CARE_PROVIDER_SITE_OTHER): Payer: Medicare Other | Admitting: Ophthalmology

## 2011-10-19 DIAGNOSIS — E1139 Type 2 diabetes mellitus with other diabetic ophthalmic complication: Secondary | ICD-10-CM

## 2011-10-19 DIAGNOSIS — H43819 Vitreous degeneration, unspecified eye: Secondary | ICD-10-CM

## 2011-10-19 DIAGNOSIS — H35039 Hypertensive retinopathy, unspecified eye: Secondary | ICD-10-CM

## 2011-10-19 DIAGNOSIS — H35379 Puckering of macula, unspecified eye: Secondary | ICD-10-CM

## 2011-10-19 DIAGNOSIS — E11319 Type 2 diabetes mellitus with unspecified diabetic retinopathy without macular edema: Secondary | ICD-10-CM

## 2011-10-19 DIAGNOSIS — I10 Essential (primary) hypertension: Secondary | ICD-10-CM

## 2011-11-20 ENCOUNTER — Other Ambulatory Visit: Payer: Self-pay | Admitting: Cardiology

## 2011-12-08 ENCOUNTER — Encounter (HOSPITAL_COMMUNITY): Payer: Self-pay

## 2011-12-08 ENCOUNTER — Inpatient Hospital Stay (HOSPITAL_COMMUNITY): Payer: Medicare Other

## 2011-12-08 ENCOUNTER — Inpatient Hospital Stay (HOSPITAL_COMMUNITY)
Admission: AD | Admit: 2011-12-08 | Discharge: 2011-12-13 | DRG: 242 | Disposition: A | Discharge status: Skilled Nursing Facility | Payer: Medicare Other | Source: Other Acute Inpatient Hospital | Attending: Internal Medicine | Admitting: Internal Medicine

## 2011-12-08 DIAGNOSIS — I509 Heart failure, unspecified: Secondary | ICD-10-CM | POA: Diagnosis present

## 2011-12-08 DIAGNOSIS — Z7982 Long term (current) use of aspirin: Secondary | ICD-10-CM

## 2011-12-08 DIAGNOSIS — Z79899 Other long term (current) drug therapy: Secondary | ICD-10-CM

## 2011-12-08 DIAGNOSIS — E1149 Type 2 diabetes mellitus with other diabetic neurological complication: Secondary | ICD-10-CM | POA: Diagnosis present

## 2011-12-08 DIAGNOSIS — N186 End stage renal disease: Secondary | ICD-10-CM | POA: Diagnosis present

## 2011-12-08 DIAGNOSIS — Z66 Do not resuscitate: Secondary | ICD-10-CM | POA: Diagnosis present

## 2011-12-08 DIAGNOSIS — E875 Hyperkalemia: Secondary | ICD-10-CM | POA: Diagnosis present

## 2011-12-08 DIAGNOSIS — N189 Chronic kidney disease, unspecified: Secondary | ICD-10-CM | POA: Diagnosis present

## 2011-12-08 DIAGNOSIS — Z794 Long term (current) use of insulin: Secondary | ICD-10-CM

## 2011-12-08 DIAGNOSIS — E785 Hyperlipidemia, unspecified: Secondary | ICD-10-CM | POA: Diagnosis present

## 2011-12-08 DIAGNOSIS — Z992 Dependence on renal dialysis: Secondary | ICD-10-CM

## 2011-12-08 DIAGNOSIS — D696 Thrombocytopenia, unspecified: Secondary | ICD-10-CM | POA: Diagnosis present

## 2011-12-08 DIAGNOSIS — R5381 Other malaise: Secondary | ICD-10-CM | POA: Diagnosis present

## 2011-12-08 DIAGNOSIS — Z8673 Personal history of transient ischemic attack (TIA), and cerebral infarction without residual deficits: Secondary | ICD-10-CM

## 2011-12-08 DIAGNOSIS — I2510 Atherosclerotic heart disease of native coronary artery without angina pectoris: Secondary | ICD-10-CM | POA: Diagnosis present

## 2011-12-08 DIAGNOSIS — N2581 Secondary hyperparathyroidism of renal origin: Secondary | ICD-10-CM | POA: Diagnosis present

## 2011-12-08 DIAGNOSIS — I442 Atrioventricular block, complete: Principal | ICD-10-CM

## 2011-12-08 DIAGNOSIS — I10 Essential (primary) hypertension: Secondary | ICD-10-CM | POA: Diagnosis present

## 2011-12-08 DIAGNOSIS — D638 Anemia in other chronic diseases classified elsewhere: Secondary | ICD-10-CM | POA: Diagnosis present

## 2011-12-08 DIAGNOSIS — I350 Nonrheumatic aortic (valve) stenosis: Secondary | ICD-10-CM | POA: Diagnosis present

## 2011-12-08 DIAGNOSIS — E119 Type 2 diabetes mellitus without complications: Secondary | ICD-10-CM | POA: Diagnosis present

## 2011-12-08 DIAGNOSIS — I120 Hypertensive chronic kidney disease with stage 5 chronic kidney disease or end stage renal disease: Secondary | ICD-10-CM | POA: Diagnosis present

## 2011-12-08 DIAGNOSIS — S98139A Complete traumatic amputation of one unspecified lesser toe, initial encounter: Secondary | ICD-10-CM

## 2011-12-08 DIAGNOSIS — I359 Nonrheumatic aortic valve disorder, unspecified: Secondary | ICD-10-CM | POA: Diagnosis present

## 2011-12-08 HISTORY — DX: Unspecified osteoarthritis, unspecified site: M19.90

## 2011-12-08 HISTORY — DX: Acute myocardial infarction, unspecified: I21.9

## 2011-12-08 LAB — CBC WITH DIFFERENTIAL/PLATELET
Basophils Absolute: 0 10*3/uL (ref 0.0–0.1)
Basophils Relative: 0 % (ref 0–1)
Eosinophils Absolute: 0.2 10*3/uL (ref 0.0–0.7)
Eosinophils Relative: 3 % (ref 0–5)
HCT: 25.7 % — ABNORMAL LOW (ref 36.0–46.0)
Lymphocytes Relative: 15 % (ref 12–46)
MCH: 33.1 pg (ref 26.0–34.0)
MCHC: 31.5 g/dL (ref 30.0–36.0)
MCV: 104.9 fL — ABNORMAL HIGH (ref 78.0–100.0)
Monocytes Absolute: 0.4 10*3/uL (ref 0.1–1.0)
Platelets: 85 10*3/uL — ABNORMAL LOW (ref 150–400)
RDW: 15.2 % (ref 11.5–15.5)

## 2011-12-08 LAB — COMPREHENSIVE METABOLIC PANEL
AST: 13 U/L (ref 0–37)
Albumin: 3.3 g/dL — ABNORMAL LOW (ref 3.5–5.2)
BUN: 38 mg/dL — ABNORMAL HIGH (ref 6–23)
Calcium: 9.3 mg/dL (ref 8.4–10.5)
Chloride: 99 mEq/L (ref 96–112)
Creatinine, Ser: 6.63 mg/dL — ABNORMAL HIGH (ref 0.50–1.10)
Total Bilirubin: 0.2 mg/dL — ABNORMAL LOW (ref 0.3–1.2)

## 2011-12-08 LAB — CK TOTAL AND CKMB (NOT AT ARMC)
CK, MB: 2.3 ng/mL (ref 0.3–4.0)
CK, MB: 2.2 ng/mL (ref 0.3–4.0)
Total CK: 35 U/L (ref 7–177)

## 2011-12-08 LAB — TROPONIN I
Troponin I: <0.3 ng/mL (ref <0.30)
Troponin I: <0.3 ng/mL (ref <0.30)

## 2011-12-08 LAB — TSH: TSH: 2.018 u[IU]/mL (ref 0.350–4.500)

## 2011-12-08 LAB — GLUCOSE, CAPILLARY

## 2011-12-08 LAB — PRO B NATRIURETIC PEPTIDE: Pro B Natriuretic peptide (BNP): 59135 pg/mL — ABNORMAL HIGH (ref 0–125)

## 2011-12-08 LAB — MRSA PCR SCREENING: MRSA by PCR: NEGATIVE

## 2011-12-08 LAB — MAGNESIUM: Magnesium: 3.3 mg/dL — ABNORMAL HIGH (ref 1.5–2.5)

## 2011-12-08 MED ORDER — HEPARIN SODIUM (PORCINE) 5000 UNIT/ML IJ SOLN
5000.0000 [IU] | Freq: Two times a day (BID) | INTRAMUSCULAR | Status: DC
  Administered 2011-12-08 – 2011-12-09 (×3): 5000 [IU] via SUBCUTANEOUS
  Filled 2011-12-08 (×4): qty 1

## 2011-12-08 MED ORDER — INSULIN ASPART 100 UNIT/ML ~~LOC~~ SOLN: 0.0000 [IU] | SUBCUTANEOUS | Status: DC

## 2011-12-08 MED ORDER — SEVELAMER CARBONATE 800 MG PO TABS
3200.0000 mg | ORAL_TABLET | Freq: Three times a day (TID) | ORAL | Status: DC
  Administered 2011-12-08 – 2011-12-13 (×12): 3200 mg via ORAL
  Filled 2011-12-08 (×17): qty 4

## 2011-12-08 MED ORDER — LIDOCAINE HCL (PF) 1 % IJ SOLN: 5.0000 mL | INTRAMUSCULAR | Status: DC | PRN

## 2011-12-08 MED ORDER — INSULIN ASPART 100 UNIT/ML ~~LOC~~ SOLN
0.0000 [IU] | Freq: Three times a day (TID) | SUBCUTANEOUS | Status: DC
  Administered 2011-12-09 (×3): 1 [IU] via SUBCUTANEOUS
  Administered 2011-12-10: 2 [IU] via SUBCUTANEOUS
  Administered 2011-12-12: 1 [IU] via SUBCUTANEOUS

## 2011-12-08 MED ORDER — LIDOCAINE-PRILOCAINE 2.5-2.5 % EX CREA
1.0000 "application " | TOPICAL_CREAM | CUTANEOUS | Status: DC | PRN
  Filled 2011-12-08: qty 5

## 2011-12-08 MED ORDER — SODIUM CHLORIDE 0.9 % IJ SOLN
3.0000 mL | Freq: Two times a day (BID) | INTRAMUSCULAR | Status: DC
  Administered 2011-12-08 – 2011-12-13 (×10): 3 mL via INTRAVENOUS

## 2011-12-08 MED ORDER — ASPIRIN 81 MG PO CHEW: 324.0000 mg | CHEWABLE_TABLET | ORAL | Status: DC

## 2011-12-08 MED ORDER — HYDRALAZINE HCL 50 MG PO TABS
50.0000 mg | ORAL_TABLET | Freq: Two times a day (BID) | ORAL | Status: DC
  Administered 2011-12-10 – 2011-12-12 (×5): 50 mg via ORAL
  Filled 2011-12-08 (×11): qty 1

## 2011-12-08 MED ORDER — ATORVASTATIN CALCIUM 20 MG PO TABS
20.0000 mg | ORAL_TABLET | Freq: Every day | ORAL | Status: DC
  Administered 2011-12-08 – 2011-12-12 (×5): 20 mg via ORAL
  Filled 2011-12-08 (×6): qty 1

## 2011-12-08 MED ORDER — SODIUM POLYSTYRENE SULFONATE 15 GM/60ML PO SUSP
15.0000 g | ORAL | Status: DC
  Filled 2011-12-08 (×2): qty 60

## 2011-12-08 MED ORDER — SODIUM CHLORIDE 0.9 % IJ SOLN: 3.0000 mL | INTRAMUSCULAR | Status: DC | PRN

## 2011-12-08 MED ORDER — INSULIN GLARGINE 100 UNIT/ML ~~LOC~~ SOLN
10.0000 [IU] | Freq: Every day | SUBCUTANEOUS | Status: DC
  Administered 2011-12-08 – 2011-12-12 (×5): 10 [IU] via SUBCUTANEOUS

## 2011-12-08 MED ORDER — ASPIRIN EC 81 MG PO TBEC
81.0000 mg | DELAYED_RELEASE_TABLET | Freq: Every day | ORAL | Status: DC
  Administered 2011-12-09 – 2011-12-13 (×5): 81 mg via ORAL
  Filled 2011-12-08 (×5): qty 1

## 2011-12-08 MED ORDER — DOXAZOSIN MESYLATE 8 MG PO TABS
8.0000 mg | ORAL_TABLET | Freq: Every day | ORAL | Status: DC
  Administered 2011-12-09 – 2011-12-12 (×4): 8 mg via ORAL
  Filled 2011-12-08 (×6): qty 1

## 2011-12-08 MED ORDER — ONDANSETRON HCL 4 MG/2ML IJ SOLN: 4.0000 mg | Freq: Four times a day (QID) | INTRAMUSCULAR | Status: DC | PRN

## 2011-12-08 MED ORDER — SODIUM CHLORIDE 0.9 % IV SOLN: 250.0000 mL | INTRAVENOUS | Status: DC | PRN

## 2011-12-08 MED ORDER — IRBESARTAN 300 MG PO TABS
300.0000 mg | ORAL_TABLET | Freq: Every day | ORAL | Status: DC
  Administered 2011-12-09 – 2011-12-12 (×4): 300 mg via ORAL
  Filled 2011-12-08 (×6): qty 1

## 2011-12-08 MED ORDER — HEPARIN SODIUM (PORCINE) 1000 UNIT/ML DIALYSIS: 20.0000 [IU]/kg | INTRAMUSCULAR | Status: DC | PRN

## 2011-12-08 MED ORDER — ASPIRIN 300 MG RE SUPP: 300.0000 mg | RECTAL | Status: DC

## 2011-12-08 MED ORDER — CINACALCET HCL 30 MG PO TABS
30.0000 mg | ORAL_TABLET | Freq: Every day | ORAL | Status: DC
  Administered 2011-12-08 – 2011-12-13 (×6): 30 mg via ORAL
  Filled 2011-12-08 (×6): qty 1

## 2011-12-08 MED ORDER — SIMVASTATIN 40 MG PO TABS: 40.0000 mg | ORAL_TABLET | Freq: Every day | ORAL | Status: DC

## 2011-12-08 MED ORDER — HEPARIN SODIUM (PORCINE) 1000 UNIT/ML DIALYSIS: 1000.0000 [IU] | INTRAMUSCULAR | Status: DC | PRN

## 2011-12-08 MED ORDER — PENTAFLUOROPROP-TETRAFLUOROETH EX AERO: 1.0000 "application " | INHALATION_SPRAY | CUTANEOUS | Status: DC | PRN

## 2011-12-08 MED ORDER — ALTEPLASE 2 MG IJ SOLR
2.0000 mg | Freq: Once | INTRAMUSCULAR | Status: AC | PRN
  Filled 2011-12-08: qty 2

## 2011-12-08 MED ORDER — NEPRO/CARBSTEADY PO LIQD
237.0000 mL | ORAL | Status: DC | PRN
  Filled 2011-12-08: qty 237

## 2011-12-08 MED ORDER — CLOPIDOGREL BISULFATE 75 MG PO TABS
75.0000 mg | ORAL_TABLET | Freq: Every day | ORAL | Status: DC
  Administered 2011-12-08 – 2011-12-13 (×6): 75 mg via ORAL
  Filled 2011-12-08 (×6): qty 1

## 2011-12-08 MED ORDER — INSULIN ASPART 100 UNIT/ML ~~LOC~~ SOLN: 0.0000 [IU] | Freq: Three times a day (TID) | SUBCUTANEOUS | Status: DC

## 2011-12-08 MED ORDER — ASPIRIN EC 81 MG PO TBEC: 81.0000 mg | DELAYED_RELEASE_TABLET | Freq: Every day | ORAL | Status: DC

## 2011-12-08 MED ORDER — INSULIN GLULISINE 100 UNIT/ML ~~LOC~~ SOLN: 5.0000 [IU] | Freq: Three times a day (TID) | SUBCUTANEOUS | Status: DC

## 2011-12-08 MED ORDER — INSULIN ASPART 100 UNIT/ML ~~LOC~~ SOLN: 0.0000 [IU] | Freq: Every day | SUBCUTANEOUS | Status: DC

## 2011-12-08 MED ORDER — PANTOPRAZOLE SODIUM 40 MG PO TBEC
40.0000 mg | DELAYED_RELEASE_TABLET | Freq: Every day | ORAL | Status: DC
  Administered 2011-12-08 – 2011-12-13 (×6): 40 mg via ORAL
  Filled 2011-12-08 (×6): qty 1

## 2011-12-08 MED ORDER — ACETAMINOPHEN 325 MG PO TABS: 650.0000 mg | ORAL_TABLET | ORAL | Status: DC | PRN

## 2011-12-08 MED ORDER — POLYETHYLENE GLYCOL 3350 17 GM/SCOOP PO POWD
68.0000 g | ORAL | Status: DC
  Filled 2011-12-08 (×2): qty 255

## 2011-12-08 MED ORDER — SODIUM CHLORIDE 0.9 % IV SOLN: 100.0000 mL | INTRAVENOUS | Status: DC | PRN

## 2011-12-08 MED ORDER — AMLODIPINE BESYLATE 5 MG PO TABS
5.0000 mg | ORAL_TABLET | Freq: Every day | ORAL | Status: DC
  Administered 2011-12-09 – 2011-12-11 (×3): 5 mg via ORAL
  Filled 2011-12-08 (×6): qty 1

## 2011-12-08 MED ORDER — NITROGLYCERIN 0.4 MG SL SUBL: 0.4000 mg | SUBLINGUAL_TABLET | SUBLINGUAL | Status: DC | PRN

## 2011-12-08 NOTE — H&P (Signed)
ELECTROPHYSIOLOGY ADMISSION HISTORY & PHYSICAL  Patient ID: Melissa [name] MRN: [id number], DOB/AGE: 07/03/1940   Date of Admission: 12/08/2011  Primary Physician: Donzetta Sprung, MD Primary Cardiologist: [facility] cardiology Dr. Willa Rough Reason for Admission: Weakness and Complete Heart Block  History of Present Illness:  Patient is a 71 years old woman with PMH of CAD/CHF/NSTEMI, Moderate AS ( Valve area 0.87 echo Jan 2013), ESRD with HD (TTS), DM, HTN and CVA who is transferred from [facility] for evaluation of weakness and CHB. Patient states that she was in her usual state of health until 3 days ago when she started to have progressive generalized weakness. She was able to go to her hemodialysis 2 days ago and stated " everything was ok with my dialysis".  She presented to [facility] ED with extreme weakness today and was noted to have complete heart block . She was given Calcium Chloride IV, Regular insulin/D50 and two doses of Kayexalate, and subsequently transferred to ICU at [facility] health.   Denies fever, chills, sweating, palpitation chest pain, chest pressure, SOB, nausea, vomiting, abdomen pain, dizziness, lightheadedness, numbness or tingling.   Of note, patient states that she has been coughing for past couple of weeks. She has tried using Tessalon perle tablets with some improvement.   Past Medical History  Diagnosis Date  . Coronary artery disease     catheterization March 2011.(presentation wtih mild CHF and mild increased troponin))..70% proximal/80% mid LAD,50% circumflex.Marland KitchenLAD could be approached but with difficulty....medicall therapy recommended/NSTEMI 01/31/2010 with sepsis and anemia...EF 50%...medical Rx,  . Diabetes mellitus   . Hyperlipidemia   . Aortic stenosis     mild to moderate...echo.Marland KitchenMarland KitchenSeptember,2006/catheterization valve area 1.9cm square/mod/severe....echo...10/2009/mod/severe....echo....01/31/2010,  . Dysfunction parathyroid     hypoparathyroidism  secondary to her chronic renal  . Hypertension   . Anemia   . CVA (cerebral vascular accident)   . Chronic kidney disease     ESRD...dialysis  . Carotid artery disease     doppler 2011 /  doppler 05/08/2010 similar, <50% bilateral with some plaque  . Edema   . Ejection fraction     EF 50%, cardiac catheterization, January, 2012    Past Surgical History  Procedure Date  . Appendectomy   . Oophorectomy   . Toe amputation     left great toe  . Retinal detachment surgery   . Cataract extraction      Allergies/Intolerances Allergies  Allergen Reactions  . Codeine Nausea And Vomiting    Home Medications Medications Prior to Admission  Medication Sig Dispense Refill  . amLODipine (NORVASC) 5 MG tablet Take 5 mg by mouth daily.      Marland Kitchen aspirin EC 81 MG tablet Take 81 mg by mouth daily.      . carvedilol (COREG) 12.5 MG tablet Take 12.5 mg by mouth 2 (two) times daily with a meal.      . cinacalcet (SENSIPAR) 30 MG tablet Take 30 mg by mouth daily.       . cloNIDine (CATAPRES - DOSED IN MG/24 HR) 0.3 mg/24hr Place 1 patch onto the skin once a week. sundays      . clopidogrel (PLAVIX) 75 MG tablet Take 1 tablet (75 mg total) by mouth daily.  30 tablet  11  . doxazosin (CARDURA) 8 MG tablet Take 8 mg by mouth at bedtime.      . hydrALAZINE (APRESOLINE) 50 MG tablet Take 50 mg by mouth 2 (two) times daily.        Marland Kitchen  insulin glargine (LANTUS) 100 UNIT/ML injection Inject 10 Units into the skin at bedtime. Patient does not take if blood sugar is 100 or below.      . insulin glulisine (APIDRA) 100 UNIT/ML injection Inject 5-15 Units into the skin 3 (three) times daily before meals. Sliding scale      . Multiple Vitamin (DAILY VITAMIN FORMULA PO) Take 3 mg by mouth daily. Folic acid- vitamin b complex - vitamin c - selenium - zinc 3 mg tablet.      . nitroGLYCERIN (NITROSTAT) 0.4 MG SL tablet Place 1 tablet (0.4 mg total) under the tongue every 5 (five) minutes as needed for chest pain.  25  tablet  3  . olmesartan (BENICAR) 40 MG tablet Take 40 mg by mouth daily.        Marland Kitchen omeprazole (PRILOSEC) 20 MG capsule Take 20 mg by mouth daily.        . polyethylene glycol powder (GLYCOLAX/MIRALAX) powder Take 68 g by mouth 3 (three) times a week. Tuesday, Thursday, Sunday      . sevelamer (RENAGEL) 800 MG tablet Take 1,600-3,200 mg by mouth See admin instructions. Takes 3200 mg with meals and 1600mg  with each snack      . simvastatin (ZOCOR) 40 MG tablet Take 40 mg by mouth at bedtime.        . sodium polystyrene (KAYEXALATE) 15 GM/60ML suspension Take 15 g by mouth 2 (two) times a week. Patient takes 15 g on Saturday and Sunday.        Family History Family History  Problem Relation Age of Onset  . Coronary artery disease Mother     died age 36  . Diabetes type II Mother   . Other Father     died in 07/24/2022 of accident  . Diabetes type II Daughter   . Hiatal hernia Son      Social History History   Social History  . Marital Status: Divorced    Spouse Name: N/A    Number of Children: N/A  . Years of Education: N/A   Occupational History  . Not on file.   Social History Main Topics  . Smoking status: Never Smoker   . Smokeless tobacco: Never Used  . Alcohol Use: No  . Drug Use: No  . Sexually Active: No   Other Topics Concern  . Not on file   Social History Narrative   Lives in [place] alone. Divorced and has four children.Has home health aide 5 days/week     Review of Systems General:  No chills, fever, night sweats or weight changes.  Cardiovascular:  No chest pain, dyspnea on exertion, edema, orthopnea, palpitations, paroxysmal nocturnal dyspnea. Dermatological: No rash, lesions or masses. Respiratory: No cough, dyspnea. Urologic: No hematuria, dysuria. Abdominal:   No nausea, vomiting, diarrhea, bright red blood per rectum, melena, or hematemesis. Neurologic:  No visual changes,changes in mental status. Positive for generalized weakness.  All other systems  reviewed and are otherwise negative except as noted above.  Physical Exam  BP 146/65 mmHg, HR 41, RR 18  General: chronic ill appearing, NAD, obesity HEENT: Normocephalic, atraumatic. EOMs intact. Sclera nonicteric. Oropharynx clear.  Neck: Supple without bruits. Unable to assess JVD due to body habitus. Lungs:  Respirations regular and unlabored, Bilateral lungs sounds diminished with rales noted posteriorly.  Heart: RRR. S1, S2 present. 4/6 systolic murmur noted on anterior chest wall, loudest on right 2nd intercostal space.  Abdomen: Soft, non-tender, non-distended. BS present x 4 quadrants.  No hepatosplenomegaly.  Extremities: No clubbing, cyanosis or edema. DP/PT/Radials 1+ and equal bilaterally. Psych: Normal affect. Neuro: Alert and oriented X 3. Moves all extremities spontaneously. Musculoskeletal: No kyphosis. Skin: Intact. Warm and dry. No rashes or petechiae in exposed areas.   Labs  Radiology/Studies  Echocardiogram:n 02/27/11 LVEF 50-55%, moderate LVH, Mild inferior wall hypokinesis Moderate AS, mean gradient 27 mmHg, Area 0.87 cm2 Moderate pulmonary hypertension.     12-lead ECG: CHB  Telemetry: CHB  Assessment and Plan Patient is a 71 years old woman with PMH of CAD/CHF/NSTEMI, Moderate AS ( Valve area 0.87 echo Jan 2013), ESRD with HD (TTS), DM, HTN and CVA who is transferred from [facility] for weakness and CHB.  1. Complete heart block, etiology is unclear. May related to medications vs electrolytes imbalance 2/2 ESRD vs cardiac ischemia vs AV node abnormality. Patient is asymptomatic with good BPs.  - Will hold all medications affecting AV node--hold Clonidine and BB - Renal consulted for urgent HD and correction of electrolytes imbalance - EKG showed lateral ischemia--will cycle cardiac enzymes and check BNP - Will apply pads on patient--No need for pacing for now since she is asymptomatic - EP ( Dr. Ladona Ridgel) consulted.     2. ESRD and HD--Urgent HD  today. Continue home meds.  3. CAD/CHF/Moderate AS   - Continue ASA, Plavix,  - EKG on admission - Cycle CE  4. DM--continue home med, check HbA1C  5. HTN- On Norvasc, Cardura, hydralazine, Benicar. Hold Clonidine and Coreg due to CHB.      6. HLD- On Zocor. Repeat Lipid panel.  7. Chronic thrombocytopenia, Platelet 79. No signs bleeding  Code status: DNR/DNI. Patient is Ok with pacing.    Signed, Renesmay Nesbitt, PGY-2 12/08/2011, 12:35 PM

## 2011-12-08 NOTE — Plan of Care (Signed)
Problem: Phase I Progression Outcomes Goal: Hemodynamically stable Outcome: Progressing Patient admitted with complete heart block but BP WDL

## 2011-12-08 NOTE — Consult Note (Signed)
Melissa [name] 12/08/2011 Joniece Smotherman D Requesting Physician:  Dr. [facility] Emms  Reason for Consult:  ESRD pt with complete heart block and K+ 5.4 HPI: The patient is a 71 y.o. year-old with hx of longstanding HTN, DM > 20 yrs, CAD with 4 MI's and cardiac stents in place, aortic stenosis, EF 50%, CVA and ESRD on HD in [place], [place] with Dr. Fausto Skillern. Patient presented with weakness to [facility] and was found to have complete heart block. Transferred to [facility] for management. Today is dialysis day, has not missed any dialysis. Is taking a beta-blocker and clonidine both of which are being held currently. No SOB   ROS  no HA or visual change  no sore throat  no abd pain , n/v/d  no cp, + longterm cough  no dysuria  +pain both arms , "arthritis"  no fall, confusion or hallucination   Past Medical History:  Past Medical History  Diagnosis Date  . Coronary artery disease     catheterization March 2011.(presentation wtih mild CHF and mild increased troponin))..70% proximal/80% mid LAD,50% circumflex.Marland KitchenLAD could be approached but with difficulty....medicall therapy recommended/NSTEMI 01/31/2010 with sepsis and anemia...EF 50%...medical Rx,  . Diabetes mellitus   . Hyperlipidemia   . Aortic stenosis     mild to moderate...echo.Marland KitchenMarland KitchenSeptember,2006/catheterization valve area 1.9cm square/mod/severe....echo...10/2009/mod/severe....echo....01/31/2010,  . 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 Hypertension   . Anemia   . CVA (cerebral vascular accident)   . Chronic kidney disease     ESRD...dialysis  . Carotid artery disease     doppler 25-Jun-2009 /  doppler 05/08/2010 similar, <50% bilateral with some plaque  . Edema   . Ejection fraction     EF 50%, cardiac catheterization, January, 2012    Past Surgical History:  Past Surgical History  Procedure Date  . Appendectomy   . Oophorectomy   . Toe amputation     left great toe  . Retinal detachment surgery   .  Cataract extraction     Family History:  Family History  Problem Relation Age of Onset  . Coronary artery disease Mother     died age 19  . Diabetes type II Mother   . Other Father     died in 06/26/2022 of accident  . Diabetes type II Daughter   . Hiatal hernia Son    Social History:  reports that she has never smoked. She has never used smokeless tobacco. She reports that she does not drink alcohol or use illicit drugs.  Allergies:  Allergies  Allergen Reactions  . Codeine Nausea And Vomiting    Home medications: Prior to Admission medications   Medication Sig Start Date End Date Taking? Authorizing Provider  amLODipine (NORVASC) 5 MG tablet Take 5 mg by mouth daily.   Yes Historical Provider, MD  aspirin EC 81 MG tablet Take 81 mg by mouth daily.   Yes Historical Provider, MD  carvedilol (COREG) 12.5 MG tablet Take 12.5 mg by mouth 2 (two) times daily with a meal.   Yes Historical Provider, MD  cinacalcet (SENSIPAR) 30 MG tablet Take 30 mg by mouth daily.    Yes Historical Provider, MD  cloNIDine (CATAPRES - DOSED IN MG/24 HR) 0.3 mg/24hr Place 1 patch onto the skin once a week. sundays   Yes Historical Provider, MD  clopidogrel (PLAVIX) 75 MG tablet Take 1 tablet (75 mg total) by mouth daily. 06/19/11  Yes Rhonda G Barrett, PA  doxazosin (CARDURA) 8 MG  tablet Take 8 mg by mouth at bedtime.   Yes Historical Provider, MD  hydrALAZINE (APRESOLINE) 50 MG tablet Take 50 mg by mouth 2 (two) times daily.     Yes Historical Provider, MD  insulin glargine (LANTUS) 100 UNIT/ML injection Inject 10 Units into the skin at bedtime. Patient does not take if blood sugar is 100 or below.   Yes Historical Provider, MD  insulin glulisine (APIDRA) 100 UNIT/ML injection Inject 5-15 Units into the skin 3 (three) times daily before meals. Sliding scale   Yes Historical Provider, MD  Multiple Vitamin (DAILY VITAMIN FORMULA PO) Take 3 mg by mouth daily. Folic acid- vitamin b complex - vitamin c - selenium -  zinc 3 mg tablet.   Yes Historical Provider, MD  nitroGLYCERIN (NITROSTAT) 0.4 MG SL tablet Place 1 tablet (0.4 mg total) under the tongue every 5 (five) minutes as needed for chest pain. 03/07/11 03/06/12 Yes Rhonda G Barrett, PA  olmesartan (BENICAR) 40 MG tablet Take 40 mg by mouth daily.     Yes Historical Provider, MD  omeprazole (PRILOSEC) 20 MG capsule Take 20 mg by mouth daily.     Yes Historical Provider, MD  polyethylene glycol powder (GLYCOLAX/MIRALAX) powder Take 68 g by mouth 3 (three) times a week. Tuesday, Thursday, Sunday   Yes Historical Provider, MD  sevelamer (RENAGEL) 800 MG tablet Take 1,600-3,200 mg by mouth See admin instructions. Takes 3200 mg with meals and 1600mg  with each snack   Yes Historical Provider, MD  simvastatin (ZOCOR) 40 MG tablet Take 40 mg by mouth at bedtime.     Yes Historical Provider, MD  sodium polystyrene (KAYEXALATE) 15 GM/60ML suspension Take 15 g by mouth 2 (two) times a week. Patient takes 15 g on Saturday and Sunday.   Yes Historical Provider, MD    Inpatient medications:      Labs: Basic Metabolic Panel: No results found for this basename: NA:7,K:7,CL:7,CO2:7,GLUCOSE:7,BUN:7,CREATININE:7,ALB:7,CALCIUM:7,PHOS:7 in the last 168 hours Liver Function Tests: No results found for this basename: AST:3,ALT:3,ALKPHOS:3,BILITOT:3,PROT:3,ALBUMIN:3 in the last 168 hours No results found for this basename: LIPASE:3,AMYLASE:3 in the last 168 hours No results found for this basename: AMMONIA:3 in the last 168 hours CBC: No results found for this basename: WBC:4,NEUTROABS:4,HGB:4,HCT:4,MCV:4,PLT:4 in the last 168 hours PT/INR: @labrcntip (inr:5) Cardiac Enzymes: No results found for this basename: CKTOTAL:5,CKMB:5,CKMBINDEX:5,TROPONINI:5 in the last 168 hours CBG: No results found for this basename: GLUCAP:5 in the last 168 hours  Iron Studies: No results found for this basename: IRON:30,TIBC:30,TRANSFERRIN:30,FERRITIN:30 in the last 168  hours  Xrays/Other Studies: No results found.  Physical Exam:  There were no vitals taken for this visit.  Gen: alert, obese adult WF, no distress Skin: no rash, cyanosis HEENT:  EOMI, sclera anicteric, throat cleat Neck: no JVD, no bruits or LAN Chest: clear bilat, no wheeze or rales Heart: slow, regular, no rub or gallop, loud 3/6 SEM to carotids Abdomen: soft, obese, no ascites, no abd wall edema, no HSM Ext: no LE edema, no gangrene or ulcer. L forearm AVG with good t/b Neuro: alert, Ox3, no focal deficit, no asterixis  [facility] HD: [facility], 3hr 15 min and EDW 93.5 kg per pt.    Impression/Plan 1. Complete heart block- per primary 2. Hyperkalemia, mild- no EKG changes 3. ESRD, cont hd tts- HD today 4. HTN/Volume- on norvasc/coreg/cardura/hydralazine/clonidine/Benicar at home. Primary svc has stopped the coreg and clonidine for now. Pull 2kg, she is at dry weight today, but BP up.  5. HPTH- renagel 4ac and  2 w snacks, continue 6. Anemia of CKD- need to get EPO dose on Monday. Hb is 8.6 today   Vinson Moselle  MD [employer] [phone] pgr    [phone] cell 12/08/2011, 11:57 AM

## 2011-12-08 NOTE — Progress Notes (Signed)
Patient ID: Melissa [name], female   DOB: 26-Jan-1941, 71 y.o.   MRN: [id number] Cardiology Attending  Patient seen and examined. I have discussed the care of this patient with Dede Query, and [employer]. She is a pleasant 71 yo woman with a h/o ESRD on HD, HTN, and aortic stenosis who is admitted with CHB. The patient has felt weak for several days. No syncope. She has been taking carvedilol and clonidine chronically with no change in the dose.  I agree with the exam as noted by Dierdre Searles Na.   A/P 1. CHB 2. ESRD on HD 3. Mild hyperkalemia 4. Aortic stenosis Rec: will admit and hold beta blocker and clonidine. Will plan PPM if her AV conduction does not improve. R/o for MI, though I do not think this is due to coronary ischemia.  Lewayne Bunting, M.D.

## 2011-12-09 LAB — GLUCOSE, CAPILLARY
Glucose-Capillary: 125 mg/dL — ABNORMAL HIGH (ref 70–99)
Glucose-Capillary: 126 mg/dL — ABNORMAL HIGH (ref 70–99)

## 2011-12-09 LAB — CBC
HCT: 26.4 % — ABNORMAL LOW (ref 36.0–46.0)
Hemoglobin: 8.3 g/dL — ABNORMAL LOW (ref 12.0–15.0)
MCV: 106.9 fL — ABNORMAL HIGH (ref 78.0–100.0)
RBC: 2.47 MIL/uL — ABNORMAL LOW (ref 3.87–5.11)
RDW: 15.1 % (ref 11.5–15.5)
WBC: 5.8 10*3/uL (ref 4.0–10.5)

## 2011-12-09 LAB — LIPID PANEL
Cholesterol: 90 mg/dL (ref 0–200)
HDL: 29 mg/dL — ABNORMAL LOW (ref >39)
LDL Cholesterol: 33 mg/dL (ref 0–99)
Total CHOL/HDL Ratio: 3.1 RATIO
Triglycerides: 142 mg/dL (ref <150)
VLDL: 28 mg/dL (ref 0–40)

## 2011-12-09 LAB — BASIC METABOLIC PANEL
CO2: 34 mEq/L — ABNORMAL HIGH (ref 19–32)
Chloride: 99 mEq/L (ref 96–112)
GFR calc Af Amer: 12 mL/min — ABNORMAL LOW (ref >90)
Potassium: 3.8 mEq/L (ref 3.5–5.1)
Sodium: 139 mEq/L (ref 135–145)

## 2011-12-09 LAB — TROPONIN I: Troponin I: <0.3 ng/mL (ref <0.30)

## 2011-12-09 LAB — HEMOGLOBIN A1C
Hgb A1c MFr Bld: 5.4 % (ref <5.7)
Mean Plasma Glucose: 108 mg/dL (ref <117)

## 2011-12-09 LAB — CK TOTAL AND CKMB (NOT AT ARMC)
CK, MB: 1.9 ng/mL (ref 0.3–4.0)
Relative Index: INVALID (ref 0.0–2.5)

## 2011-12-09 NOTE — Plan of Care (Signed)
Problem: Phase I Progression Outcomes Goal: No Coumadin, ASA, or antiplatelet meds/48 hrs Outcome: Not Applicable Date Met:  12/09/11 Dr Johney Frame reviewed patient medications. Discontinued SQ heparin, but patient will remain on ASA 81mg  and Plavix 75mg 

## 2011-12-09 NOTE — Progress Notes (Signed)
Subjective: No complaints, still in complete heart block, rate 35-40, no sob or cp. troponins are neg.   Objective Vital signs in last 24 hours: Filed Vitals:   12/09/11 1000 12/09/11 1100 12/09/11 1157 12/09/11 1200  BP: 126/30 151/38  136/37  Pulse: 38 39  38  Temp:   98.2 F (36.8 C)   TempSrc:   Oral   Resp: 15 16  17   Height:      Weight:      SpO2: 100% 100%  100%   Weight change:   Intake/Output Summary (Last 24 hours) at 12/09/11 1311 Last data filed at 12/09/11 1100  Gross per 24 hour  Intake    800 ml  Output   2047 ml  Net  -1247 ml   Labs: Basic Metabolic Panel:  Lab 12/09/11 04/54/09 1307  NA 139 137  K 3.8 4.3  CL 99 99  CO2 34* 27  GLUCOSE 116* 101*  BUN 17 38*  CREATININE 3.88* 6.63*  ALB -- --  CALCIUM 9.0 9.3  PHOS -- --   Liver Function Tests:  Lab 12/08/11 1307  AST 13  ALT 11  ALKPHOS 119*  BILITOT 0.2*  PROT 5.8*  ALBUMIN 3.3*   No results found for this basename: LIPASE:3,AMYLASE:3 in the last 168 hours No results found for this basename: AMMONIA:3 in the last 168 hours CBC:  Lab 12/09/11 12/08/11 1307  WBC 5.8 5.5  NEUTROABS -- 4.1  HGB 8.3* 8.1*  HCT 26.4* 25.7*  MCV 106.9* 104.9*  PLT 83* 85*   PT/INR: @labrcntip (inr:5) Cardiac Enzymes:  Lab 12/09/11 12/08/11 1904 12/08/11 1308  CKTOTAL 32 41 35  CKMB 1.9 2.2 2.3  CKMBINDEX -- -- --  TROPONINI <0.30 <0.30 <0.30   CBG:  Lab 12/09/11 1158 12/09/11 0807 12/09/11 0427 12/08/11 2123 12/08/11 1548  GLUCAP 125* 124* 99 154* 95    Iron Studies: No results found for this basename: IRON:30,TIBC:30,TRANSFERRIN:30,FERRITIN:30 in the last 168 hours  Physical Exam:  Blood pressure 136/37, pulse 38, temperature 98.2 F (36.8 C), temperature source Oral, resp. rate 17, height 5\' 4"  (1.626 m), weight 91 kg (200 lb 9.9 oz), SpO2 100.00%.  Gen: alert, obese adult WF, no distress  Skin: no rash, cyanosis  HEENT: EOMI, sclera anicteric, throat cleat  Neck: no JVD, no bruits or  LAN  Chest: clear bilat, no wheeze or rales  Heart: slow, regular, no rub or gallop, loud 3/6 SEM to carotids  Abdomen: soft, obese, no ascites, no abd wall edema, no HSM  Ext: no LE edema, no gangrene or ulcer. L forearm AVG with good t/b  Neuro: alert, Ox3, no focal deficit, no asterixis   Outpatient HD: [facility], 3hr 15 min and EDW 93.5 kg per pt.   Impression/Plan  1. Complete heart block- persistent off BB, possible PPM tomorrow 2. Hyperkalemia- resolved 3. ESRD, cont hd tts 4. Hx CAD by cath 2011, medical Rx / moderate AS- on asa and plavix 5. DNR/DNI 6. HTN/Volume- on amlodipine/carvedilol/cardura/hydralazine/clonidine/olmesartan at home. Primary svc has stopped the coreg and clonidine for now. Tolerated 2kg off Sat with late BP drop. Next HD Tuesday. No vol excess, below EDW.  7. HPTH- renagel 4ac and 2 w snacks, continue 8. Anemia of CKD- need to get EPO dose on Monday. Hb 8-9 range   Vinson Moselle  MD [facility] [phone] pgr    [phone] cell 12/09/2011, 1:11 PM

## 2011-12-09 NOTE — Progress Notes (Signed)
Utilization review completed.  

## 2011-12-09 NOTE — Plan of Care (Signed)
Problem: Consults Goal: Skin Care Protocol Initiated - if Braden Score 18 or less If consults are not indicated, leave blank or document N/A  Outcome: Completed/Met Date Met:  12/09/11 Encouraging patient to turn q 2 hours. Barrier cream to buttocks to prevent excoriation from moisture  Problem: Phase I Progression Outcomes Goal: Voiding-avoid urinary catheter unless indicated Outcome: Not Applicable Date Met:  12/09/11 Patient anuric

## 2011-12-09 NOTE — Progress Notes (Signed)
Patient ID: Melissa [name], female   DOB: 05/19/1940, 71 y.o.   MRN: [id number] Subjective:  No chest pain or sob. No problems with HD yesterday Objective:  Vital Signs in the last 24 hours: Temp:  [97.8 F (36.6 C)-98.6 F (37 C)] 97.9 F (36.6 C) (11/10 0441) Pulse Rate:  [34-81] 39  (11/10 0442) Resp:  [14-18] 15  (11/10 0442) BP: (93-169)/(23-111) 135/86 mmHg (11/10 0423) SpO2:  [95 %-100 %] 100 % (11/10 0442) FiO2 (%):  [0 %] 0 % (11/09 1249) Weight:  [200 lb 2.8 oz (90.8 kg)-206 lb 5.6 oz (93.6 kg)] 200 lb 9.9 oz (91 kg) (11/10 0442)  Intake/Output from previous day: 11/09 0701 - 11/10 0700 In: 440 [P.O.:440] Out: 2047  Intake/Output from this shift:    Physical Exam: Well appearing NAD HEENT: Unremarkable Neck:  7 cm JVD, no thyromegally Lungs:  Clear with no wheezes HEART:  Regular rate rhythm, no murmurs, no rubs, no clicks Abd:  soft, positive bowel sounds, no organomegally, no rebound, no guarding Ext:  2 plus pulses, no edema, no cyanosis, no clubbing Skin:  No rashes no nodules Neuro:  CN II through XII intact, motor grossly intact  Lab Results:  Basename 12/09/11 12/08/11 1307  WBC 5.8 5.5  HGB 8.3* 8.1*  PLT 83* 85*    Basename 12/09/11 12/08/11 1307  NA 139 137  K 3.8 4.3  CL 99 99  CO2 34* 27  GLUCOSE 116* 101*  BUN 17 38*  CREATININE 3.88* 6.63*    Basename 12/09/11 12/08/11 1904  TROPONINI <0.30 <0.30   Hepatic Function Panel  Basename 12/08/11 1307  PROT 5.8*  ALBUMIN 3.3*  AST 13  ALT 11  ALKPHOS 119*  BILITOT 0.2*  BILIDIR --  IBILI --    Basename 12/09/11  CHOL 90   No results found for this basename: PROTIME in the last 72 hours  Imaging: Dg Chest Port 1 View  12/08/2011  *RADIOLOGY REPORT*  Clinical Data: Admission  PORTABLE CHEST - 1 VIEW  Comparison: 12/08/2011  Findings: Cardiomediastinal silhouette is stable.  Stable chronic interstitial prominence.  No convincing pulmonary edema.  Mild basilar atelectasis.  No  segmental infiltrate.  IMPRESSION: Stable chronic interstitial prominence.  No convincing pulmonary edema.  Mild basilar atelectasis.  No segmental infiltrate.   Original Report Authenticated By: Liviu Pop, M.D.     Cardiac Studies: Tele - nsr with CHB Assessment/Plan:  1. CHB 2. ESRD on HD 3. HTN Rec: her beta blocker has been held over 24 hours. Will plan PPM tomorrow if no improvement in AV conduction. She currently has CHB at 37/min.  LOS: 1 day    Gregg Taylor,M.D. 12/09/2011, 7:41 AM     

## 2011-12-10 ENCOUNTER — Encounter (HOSPITAL_COMMUNITY)
Admission: AD | Disposition: A | Discharge status: Skilled Nursing Facility | Payer: Self-pay | Source: Other Acute Inpatient Hospital | Attending: Internal Medicine

## 2011-12-10 DIAGNOSIS — I442 Atrioventricular block, complete: Secondary | ICD-10-CM

## 2011-12-10 HISTORY — PX: PERMANENT PACEMAKER INSERTION: SHX5480

## 2011-12-10 HISTORY — PX: PACEMAKER INSERTION: SHX728

## 2011-12-10 LAB — BASIC METABOLIC PANEL
CO2: 32 mEq/L (ref 19–32)
Chloride: 100 mEq/L (ref 96–112)
Glucose, Bld: 110 mg/dL — ABNORMAL HIGH (ref 70–99)
Sodium: 141 mEq/L (ref 135–145)

## 2011-12-10 LAB — CBC
HCT: 25.9 % — ABNORMAL LOW (ref 36.0–46.0)
MCH: 33.5 pg (ref 26.0–34.0)
MCV: 107 fL — ABNORMAL HIGH (ref 78.0–100.0)
RBC: 2.42 MIL/uL — ABNORMAL LOW (ref 3.87–5.11)
WBC: 5.4 10*3/uL (ref 4.0–10.5)

## 2011-12-10 LAB — GLUCOSE, CAPILLARY
Glucose-Capillary: 125 mg/dL — ABNORMAL HIGH (ref 70–99)
Glucose-Capillary: 103 mg/dL — ABNORMAL HIGH (ref 70–99)
Glucose-Capillary: 109 mg/dL — ABNORMAL HIGH (ref 70–99)

## 2011-12-10 SURGERY — PERMANENT PACEMAKER INSERTION
Anesthesia: LOCAL

## 2011-12-10 MED ORDER — HYDROCODONE-ACETAMINOPHEN 10-325 MG PO TABS
1.0000 | ORAL_TABLET | Freq: Four times a day (QID) | ORAL | Status: DC | PRN
  Administered 2011-12-10 – 2011-12-12 (×6): 1 via ORAL
  Filled 2011-12-10 (×6): qty 1

## 2011-12-10 MED ORDER — SODIUM CHLORIDE 0.9 % IV SOLN: 250.0000 mL | INTRAVENOUS | Status: DC

## 2011-12-10 MED ORDER — POLYETHYLENE GLYCOL 3350 17 GM/SCOOP PO POWD
68.0000 g | ORAL | Status: DC
  Administered 2011-12-11: 68 g via ORAL
  Filled 2011-12-10: qty 255

## 2011-12-10 MED ORDER — CEFAZOLIN SODIUM 1-5 GM-% IV SOLN
1.0000 g | Freq: Four times a day (QID) | INTRAVENOUS | Status: AC
  Administered 2011-12-10 – 2011-12-11 (×3): 1 g via INTRAVENOUS
  Filled 2011-12-10 (×3): qty 50

## 2011-12-10 MED ORDER — SODIUM CHLORIDE 0.9 % IR SOLN
80.0000 mg | Status: DC
  Filled 2011-12-10: qty 2

## 2011-12-10 MED ORDER — LIDOCAINE HCL (PF) 1 % IJ SOLN
INTRAMUSCULAR | Status: AC
  Filled 2011-12-10: qty 60

## 2011-12-10 MED ORDER — ACETAMINOPHEN 325 MG PO TABS
325.0000 mg | ORAL_TABLET | ORAL | Status: DC | PRN
Start: 2011-12-10 — End: 2011-12-13
  Administered 2011-12-10 – 2011-12-13 (×2): 650 mg via ORAL
  Filled 2011-12-10 (×2): qty 2

## 2011-12-10 MED ORDER — SODIUM CHLORIDE 0.9 % IV SOLN: 250.0000 mL | INTRAVENOUS | Status: DC | PRN

## 2011-12-10 MED ORDER — HEPARIN (PORCINE) IN NACL 2-0.9 UNIT/ML-% IJ SOLN
INTRAMUSCULAR | Status: AC
  Filled 2011-12-10: qty 1000

## 2011-12-10 MED ORDER — ONDANSETRON HCL 4 MG/2ML IJ SOLN: 4.0000 mg | Freq: Four times a day (QID) | INTRAMUSCULAR | Status: DC | PRN

## 2011-12-10 MED ORDER — SODIUM POLYSTYRENE SULFONATE 15 GM/60ML PO SUSP: 15.0000 g | ORAL | Status: DC

## 2011-12-10 MED ORDER — SODIUM CHLORIDE 0.9 % IJ SOLN
3.0000 mL | Freq: Two times a day (BID) | INTRAMUSCULAR | Status: DC
  Administered 2011-12-10 – 2011-12-13 (×6): 3 mL via INTRAVENOUS

## 2011-12-10 MED ORDER — SODIUM CHLORIDE 0.45 % IV SOLN
INTRAVENOUS | Status: DC
  Administered 2011-12-10: 50 mL via INTRAVENOUS

## 2011-12-10 MED ORDER — SODIUM CHLORIDE 0.9 % IJ SOLN: 3.0000 mL | INTRAMUSCULAR | Status: DC | PRN

## 2011-12-10 MED ORDER — CEFAZOLIN SODIUM-DEXTROSE 2-3 GM-% IV SOLR
2.0000 g | INTRAVENOUS | Status: DC
  Filled 2011-12-10: qty 50

## 2011-12-10 NOTE — Care Management Note (Addendum)
    Page 1 of 1   12/12/2011     4:36:31 PM   CARE MANAGEMENT NOTE 12/12/2011  Patient:  Melissa [name], Melissa [name]   Account Number:  [id number]  Date Initiated:  12/10/2011  Documentation initiated by:  Junius Creamer  Subjective/Objective Assessment:   adm w arrthymia     Action/Plan:   lives alone, 2 supp kids, pcp dr Aurther Loft Reuel Boom, goes to hd on tu-th-sat, hx of adv homecare   Anticipated DC Date:  12/13/2011   Anticipated DC Plan:  SKILLED NURSING FACILITY  In-house referral  Clinical Social Worker      DC Planning Services  CM consult      Choice offered to / List presented to:             Status of service:  In process, will continue to follow Medicare Important Message given?   (If response is "NO", the following Medicare IM given date fields will be blank) Date Medicare IM given:   Date Additional Medicare IM given:    Discharge Disposition:    Per UR Regulation:  Reviewed for med. necessity/level of care/duration of stay  If discussed at Long Length of Stay Meetings, dates discussed:    Comments:  12/12/11 Kesley Mullens,RN,BSN [phone] PT EVAL DONE TODAY; RECOMMENDATION IS FOR SNF.  MET WITH PT TO DISCUSS DC PLANS.  PT STATES SHE LIVES ALONE, AND THAT HER CHILDREN COULD STAY WITH HER AT NIGHT FOR ONLY A FEW DAYS.  SHE HAS BEEN IN SNF IN THE PAST, AND AGREES THAT SHE NEEDS TO GET STRONGER.  SHE PREFERS [facility] SNF IN [place], IF POSSIBLE.  WILL CONSULT CSW TO FACILITATE DC TO SNF, HOPEFULLY IN AM, PENDING BED AVAILABILITY.  11/11 12:24p debbie dowell rn,bsn [phone]

## 2011-12-10 NOTE — H&P (View-Only) (Signed)
Patient ID: Melissa [name], female   DOB: [birth year], 71 y.o.   MRN: [id number] Subjective:  No chest pain or sob. No problems with HD yesterday Objective:  Vital Signs in the last 24 hours: Temp:  [97.8 F (36.6 C)-98.6 F (37 C)] 97.9 F (36.6 C) (11/10 0441) Pulse Rate:  [34-81] 39  (11/10 0442) Resp:  [14-18] 15  (11/10 0442) BP: (93-169)/(23-111) 135/86 mmHg (11/10 0423) SpO2:  [95 %-100 %] 100 % (11/10 0442) FiO2 (%):  [0 %] 0 % (11/09 1249) Weight:  [200 lb 2.8 oz (90.8 kg)-206 lb 5.6 oz (93.6 kg)] 200 lb 9.9 oz (91 kg) (11/10 0442)  Intake/Output from previous day: 11/09 0701 - 11/10 0700 In: 440 [P.O.:440] Out: 2047  Intake/Output from this shift:    Physical Exam: Well appearing NAD HEENT: Unremarkable Neck:  7 cm JVD, no thyromegally Lungs:  Clear with no wheezes HEART:  Regular rate rhythm, no murmurs, no rubs, no clicks Abd:  soft, positive bowel sounds, no organomegally, no rebound, no guarding Ext:  2 plus pulses, no edema, no cyanosis, no clubbing Skin:  No rashes no nodules Neuro:  CN II through XII intact, motor grossly intact  Lab Results:  [facility] 12/09/11 12/08/11 1307  WBC 5.8 5.5  HGB 8.3* 8.1*  PLT 83* 85*    Basename 12/09/11 12/08/11 1307  NA 139 137  K 3.8 4.3  CL 99 99  CO2 34* 27  GLUCOSE 116* 101*  BUN 17 38*  CREATININE 3.88* 6.63*    Basename 12/09/11 12/08/11 1904  TROPONINI <0.30 <0.30   Hepatic Function Panel  Basename 12/08/11 1307  PROT 5.8*  ALBUMIN 3.3*  AST 13  ALT 11  ALKPHOS 119*  BILITOT 0.2*  BILIDIR --  IBILI --    Basename 12/09/11  CHOL 90   No results found for this basename: PROTIME in the last 72 hours  Imaging: Dg Chest Port 1 View  12/08/2011  *RADIOLOGY REPORT*  Clinical Data: Admission  PORTABLE CHEST - 1 VIEW  Comparison: 12/08/2011  Findings: Cardiomediastinal silhouette is stable.  Stable chronic interstitial prominence.  No convincing pulmonary edema.  Mild basilar atelectasis.  No  segmental infiltrate.  IMPRESSION: Stable chronic interstitial prominence.  No convincing pulmonary edema.  Mild basilar atelectasis.  No segmental infiltrate.   Original Report Authenticated By: Natasha Mead, M.D.     Cardiac Studies: Tele - nsr with CHB Assessment/Plan:  1. CHB 2. ESRD on HD 3. HTN Rec: her beta blocker has been held over 24 hours. Will plan PPM tomorrow if no improvement in AV conduction. She currently has CHB at 37/min.  LOS: 1 day    Salar Molden,M.D. 12/09/2011, 7:41 AM

## 2011-12-10 NOTE — Op Note (Signed)
SURGEON:  [name], MD     PREPROCEDURE DIAGNOSIS:  Complete heart block    POSTPROCEDURE DIAGNOSIS:  Complete heart block, Persistent left SVC     PROCEDURES:   1. Right upper extremity venography.   2. Pacemaker implantation.     INTRODUCTION: Melissa [name] is a 71 y.o. female  with a history of complete heart block who presents today for pacemaker implantation.  The patient reports intermittent episodes of dizziness and fatigue over the past few days. She is found to have persistent complete heart block despite holding her chronotropic agents for more than 48 hours.  No reversible causes have been identified.  The patient therefore presents today for pacemaker implantation.     DESCRIPTION OF PROCEDURE:  Informed written consent was obtained, and the patient was brought to the electrophysiology lab in a fasting state.  The patient required no sedation for the procedure today.  The patients left chest was prepped and draped in the usual sterile fashion by the EP lab staff. The skin overlying the left deltopectoral region was infiltrated with lidocaine for local analgesia.  A 4-cm incision was made over the left deltopectoral region.  A left subcutaneous pacemaker pocket was fashioned using a combination of sharp and blunt dissection. Electrocautery was required to assure hemostasis.   The left axillary vein was cannulated.  A j tipped wired was advanced through the left axillary vein and revealed what appeared to be a persistent left superior vena cava with the wire advancing into the coronary sinus.  The left subclavian vein was directly cannulated and this same finding was again confirmed.  I therefore elected to perform pacemaker implantation from the right side.  The pocket was irrigated with copious gentamicin solution and then closed in 2 layers with 2.0 Vicryl suture for the subcutaneous and subcuticular layers.  Steri-Strips and a sterile dressing were then applied.  The patient's right  chest was then prepped and draped in a similar fashion. The skin overlying the right deltopectoral region was infiltrated with lidocaine for local analgesia.  A 4-cm incision was made over the right deltopectoral region.  A right subcutaneous pacemaker pocket was fashioned using a combination of sharp and blunt dissection. Electrocautery was required to assure hemostasis.  .  Right Upper Extremity Venography: A venogram of the right upper extremity was performed, which revealed a large cephalic vein, which emptied into a large subclavian vein.  The right axillary vein was moderate in size.    RA/RV Lead Placement: The right axillary vein was therefore cannulated.  Through the right axillary vein, a [facility] model [phone] (serial number  [id number]) right atrial lead and a [facility] model 2088- 52 (serial number  [id number]) right ventricular lead were advanced with fluoroscopic visualization into the right atrial appendage and right ventricular apex positions respectively.  Initial atrial lead P- waves measured 3 mV with impedance of 424 ohms and a threshold of 0.9 V at 0.5 msec.  Right ventricular lead R-waves measured 7 mV with an impedance of 431 ohms and a threshold of 0.6 V at 0.5 msec.  Both leads were secured to the pectoralis fascia using #2-0 silk over the suture sleeves.   Device Placement:  The leads were then connected to a [employer] DR RF model [id number] (serial number [id number]) pacemaker.  The pocket was irrigated with copious gentamicin solution.  The pacemaker was then placed into the pocket.  The pocket was then closed in 2 layers  with 2.0 Vicryl suture for the subcutaneous and subcuticular layers.  Steri-Strips and a sterile dressing were then applied.  There were no early apparent complications.     CONCLUSIONS:   1. Successful implantation of a [employer]product [employer] DR RF dual-chamber pacemaker for complete heart block  2. An initial attempt of placement on the  left side could not be performed due to persistent left superior vena cava and the device is therefore implanted on the right side.  3. No early apparent complications.           [name], MD 12/10/2011 1:13 PM

## 2011-12-10 NOTE — Progress Notes (Signed)
Patient ID: Melissa [name], female   DOB: October 01, 1940, 71 y.o.   MRN: [id number]  [facility] Progress Note    Subjective:   Feels good but a little tired   Objective:   BP 156/23  Pulse 35  Temp 98.2 F (36.8 C) (Oral)  Resp 14  Ht 5\' 4"  (1.626 m)  Wt 93.2 kg (205 lb 7.5 oz)  BMI 35.27 kg/m2  SpO2 100%  Physical Exam: Gen:WD WN WF in NAD GEX:[id number] at 38, III/VI SEM, harsh Resp:CTA UUV:OZDGUY Ext:no edema, L forearm AVG +T/B  Labs: BMET  Lab 12/10/11 0628 12/09/11 12/08/11 1307  NA 141 139 137  K 4.3 3.8 4.3  CL 100 99 99  CO2 32 34* 27  GLUCOSE 110* 116* 101*  BUN 46* 17 38*  CREATININE 6.92* 3.88* 6.63*  ALBUMIN -- -- 3.3*  CALCIUM 9.3 9.0 9.3  PHOS -- -- --   CBC  Lab 12/10/11 0628 12/09/11 12/08/11 1307  WBC 5.4 5.8 5.5  NEUTROABS -- -- 4.1  HGB 8.1* 8.3* 8.1*  HCT 25.9* 26.4* 25.7*  MCV 107.0* 106.9* 104.9*  PLT 85* 83* 85*    @IMGRELPRIORS @ Medications:      . amLODipine  5 mg Oral Daily  . aspirin EC  81 mg Oral Daily  . atorvastatin  20 mg Oral q1800  .  ceFAZolin (ANCEF) IV  2 g Intravenous On Call  . cinacalcet  30 mg Oral Daily  . clopidogrel  75 mg Oral Daily  . doxazosin  8 mg Oral QHS  . gentamicin irrigation  80 mg Irrigation On Call  . hydrALAZINE  50 mg Oral BID  . insulin aspart  0-5 Units Subcutaneous QHS  . insulin aspart  0-9 Units Subcutaneous TID WC  . insulin glargine  10 Units Subcutaneous QHS  . irbesartan  300 mg Oral Daily  . pantoprazole  40 mg Oral Daily  . polyethylene glycol powder  68 g Oral 3 times weekly  . sevelamer  3,200 mg Oral TID WC  . sodium chloride  3 mL Intravenous Q12H  . sodium polystyrene  15 g Oral 2 times weekly  . [DISCONTINUED] heparin  5,000 Units Subcutaneous Q12H     Assessment/ Plan:   1. Complete heart block- persistent off BB, for PPM today 2. Hyperkalemia- resolved 3. ESRD, cont hd tts 4. Hx CAD by cath 2011, medical Rx / moderate AS- on asa and  plavix 5. DNR/DNI 6. HTN/Volume- on amlodipine/carvedilol/cardura/hydralazine/clonidine/olmesartan at home. Primary svc has stopped the coreg and clonidine for now. Tolerated 2kg off Sat with late BP drop. Next HD Tuesday. No vol excess, below EDW.  7. HPTH- renagel 4ac and 2 w snacks, continue 8. Anemia of CKD- need to get EPO dose on Monday. Hb 8-9 range 9. Dispo- per cardiology  Tiney Zipper A 12/10/2011, 10:28 AM

## 2011-12-10 NOTE — Progress Notes (Signed)
Inpatient Diabetes Program Recommendations  AACE/ADA: New Consensus Statement on Inpatient Glycemic Control (2013)  Target Ranges:  Prepandial:   less than 140 mg/dL      Peak postprandial:   less than 180 mg/dL (1-2 hours)      Critically ill patients:  140 - 180 mg/dL   Reason for Visit: Management of Diabetes  Patient is a 71 years old woman with PMH of CAD/CHF/NSTEMI, Moderate AS ( Valve area 0.87 echo Jan 2013), ESRD with HD (TTS), DM, HTN and CVA who is transferred from [facility] for evaluation of weakness and CHB.  Patient states that she was in her usual state of health until 3 days ago when she started to have progressive generalized weakness. She was able to go to her hemodialysis 2 days ago and stated " everything was ok with my dialysis". She presented to [facility] ED with extreme weakness today and was noted to have complete heart block .   Results for WILMER, BERRYHILL (MRN [id number]) as of 12/10/2011 14:57  Ref. Range 12/10/2011 06:28  Sodium Latest Range: 135-145 mEq/L 141  Potassium Latest Range: 3.5-5.1 mEq/L 4.3  Chloride Latest Range: 96-112 mEq/L 100  CO2 Latest Range: 19-32 mEq/L 32  BUN Latest Range: 6-23 mg/dL 46 (H)  Creatinine Latest Range: 0.50-1.10 mg/dL 4.01 (H)  Calcium Latest Range: 8.4-10.5 mg/dL 9.3  GFR calc non Af Amer Latest Range: >90 mL/min 5 (L)  GFR calc Af Amer Latest Range: >90 mL/min 6 (L)  Glucose Latest Range: 70-99 mg/dL 027 (H)   Results for KAYLANI, FROMME (MRN [id number]) as of 12/10/2011 14:57  Ref. Range 12/10/2011 14:29  Glucose-Capillary Latest Range: 70-99 mg/dL 474 (H)      Recommendations:  Continue with Lantus 10 units QHS and Novolog sensitive tidwc and hs. If blood sugars increase post-prandially, would add Novolog 2 units tidwc for meal coverage.  Will continue to follow.

## 2011-12-10 NOTE — Interval H&P Note (Signed)
History and Physical Interval Note:  12/10/2011 9:22 AM  Melissa [name]  has presented today for surgery, with the diagnosis of bradycardia  The various methods of treatment have been discussed with the patient and family. After consideration of risks, benefits and other options for treatment, the patient has consented to  Procedure(s) (LRB) with comments: PERMANENT PACEMAKER INSERTION (N/A) as a surgical intervention .  The patient's history has been reviewed, patient examined, no change in status, stable for surgery.  I have reviewed the patient's chart and labs.  Questions were answered to the patient's satisfaction.     Melissa [name]  The patient has persistent complete heart block despite holding AV nodal agents.  She has advanced conduction disease and will require PPM.  Risks, benefits, alternatives to pacemaker implantation were discussed in detail with the patient today. The patient understands that the risks include but are not limited to bleeding, infection, pneumothorax, perforation, tamponade, vascular damage,  MI, stroke, death,  and lead dislodgement and wishes to proceed. We will therefore proceed at this time.  She is aware that with her comorbidites that her risks are increased.

## 2011-12-11 ENCOUNTER — Encounter: Payer: Self-pay | Admitting: *Deleted

## 2011-12-11 ENCOUNTER — Inpatient Hospital Stay (HOSPITAL_COMMUNITY): Payer: Medicare Other

## 2011-12-11 DIAGNOSIS — Z950 Presence of cardiac pacemaker: Secondary | ICD-10-CM | POA: Insufficient documentation

## 2011-12-11 LAB — CBC
HCT: 25.7 % — ABNORMAL LOW (ref 36.0–46.0)
Hemoglobin: 8.3 g/dL — ABNORMAL LOW (ref 12.0–15.0)
MCH: 33.5 pg (ref 26.0–34.0)
MCV: 103.6 fL — ABNORMAL HIGH (ref 78.0–100.0)
RBC: 2.48 MIL/uL — ABNORMAL LOW (ref 3.87–5.11)
WBC: 6.7 10*3/uL (ref 4.0–10.5)

## 2011-12-11 LAB — RENAL FUNCTION PANEL
BUN: 62 mg/dL — ABNORMAL HIGH (ref 6–23)
Glucose, Bld: 101 mg/dL — ABNORMAL HIGH (ref 70–99)
Phosphorus: 5.2 mg/dL — ABNORMAL HIGH (ref 2.3–4.6)
Potassium: 4.4 mEq/L (ref 3.5–5.1)

## 2011-12-11 LAB — GLUCOSE, CAPILLARY: Glucose-Capillary: 145 mg/dL — ABNORMAL HIGH (ref 70–99)

## 2011-12-11 MED ORDER — HYDROCODONE-ACETAMINOPHEN 5-325 MG PO TABS
ORAL_TABLET | ORAL | Status: AC
  Filled 2011-12-11: qty 1

## 2011-12-11 NOTE — Progress Notes (Signed)
Patient: Melissa [name] Date of Encounter: 12/11/2011, 8:34 AM Admit date: 12/08/2011     Subjective  Melissa [name] reports feeling fatigued/"a little weak" this AM; otherwise, she has no complaints. She denies CP or SOB. I am seeing her in the dialysis unit.   Objective  Physical Exam: Vitals: BP 130/48  Pulse 66  Temp 98.7 F (37.1 C) (Oral)  Resp 15  Ht 5\' 4"  (1.626 m)  Wt 201 lb 11.5 oz (91.5 kg)  BMI 34.63 kg/m2  SpO2 99% General: Well developed, pale 71 year old female in no acute distress. Neck: Supple. JVD not elevated. Lungs: Clear bilaterally to auscultation anteriorly without wheezes, rales, or rhonchi. Breathing is unlabored. Heart: RRR. S1 S2 present with III/VI systolic murmur. No diastolic murmur, rub or gallop.  Abdomen: Soft, non-distended. Extremities: No clubbing or cyanosis. No edema.   Neuro: Alert and oriented X 3. Moves all extremities spontaneously. No focal deficits. Skin: Upper chest wounds intact with pressure dressings in place. No significant bleeding or hematoma.  Intake/Output:  Intake/Output Summary (Last 24 hours) at 12/11/11 0834 Last data filed at 12/11/11 0500  Gross per 24 hour  Intake   1160 ml  Output      0 ml  Net   1160 ml    Inpatient Medications:     . amLODipine  5 mg Oral Daily  . aspirin EC  81 mg Oral Daily  . atorvastatin  20 mg Oral q1800  . [COMPLETED]  ceFAZolin (ANCEF) IV  1 g Intravenous Q6H  . cinacalcet  30 mg Oral Daily  . clopidogrel  75 mg Oral Daily  . doxazosin  8 mg Oral QHS  . [COMPLETED] heparin      . hydrALAZINE  50 mg Oral BID  . insulin aspart  0-5 Units Subcutaneous QHS  . insulin aspart  0-9 Units Subcutaneous TID WC  . insulin glargine  10 Units Subcutaneous QHS  . irbesartan  300 mg Oral Daily  . [COMPLETED] lidocaine      . [COMPLETED] lidocaine      . pantoprazole  40 mg Oral Daily  . polyethylene glycol powder  68 g Oral Custom  . sevelamer  3,200 mg Oral TID WC  . sodium chloride  3  mL Intravenous Q12H  . sodium chloride  3 mL Intravenous Q12H  . sodium polystyrene  15 g Oral Custom  . [DISCONTINUED]  ceFAZolin (ANCEF) IV  2 g Intravenous On Call  . [DISCONTINUED] gentamicin irrigation  80 mg Irrigation On Call  . [DISCONTINUED] polyethylene glycol powder  68 g Oral 3 times weekly  . [DISCONTINUED] sodium polystyrene  15 g Oral 2 times weekly      . [DISCONTINUED] sodium chloride 50 mL/hr at 12/10/11 0700  . [DISCONTINUED] sodium chloride      Labs:  [facility] 12/10/11 0628 12/09/11 12/08/11 1307  NA 141 139 --  K 4.3 3.8 --  CL 100 99 --  CO2 32 34* --  GLUCOSE 110* 116* --  BUN 46* 17 --  CREATININE 6.92* 3.88* --  CALCIUM 9.3 9.0 --  MG -- -- 3.3*  PHOS -- -- --    Basename 12/08/11 1307  AST 13  ALT 11  ALKPHOS 119*  BILITOT 0.2*  PROT 5.8*  ALBUMIN 3.3*    Basename 12/10/11 0628 12/09/11 12/08/11 1307  WBC 5.4 5.8 --  NEUTROABS -- -- 4.1  HGB 8.1* 8.3* --  HCT 25.9* 26.4* --  MCV  107.0* 106.9* --  PLT 85* 83* --    Basename 12/09/11 12/08/11 1904 12/08/11 1308  CKTOTAL 32 41 35  CKMB 1.9 2.2 2.3  TROPONINI <0.30 <0.30 <0.30    Basename 12/08/11 1307  HGBA1C 5.4    Basename 12/09/11  CHOL 90  HDL 29*  LDLCALC 33  TRIG 161  CHOLHDL 3.1    Basename 12/08/11 1307  TSH 2.018  T4TOTAL --  T3FREE --  THYROIDAB --    Radiology/Studies: Dg Chest 2 View  12/11/2011  *RADIOLOGY REPORT*  Clinical Data: Post pacemaker insertion  CHEST - 2 VIEW  Comparison: Chest radiograph 12/08/2011  Findings: Right-sided pacemaker with two leads overlie stable cardiac silhouette.  No evidence of pneumothorax.  Mild central venous pulmonary congestion unchanged. Trace pleural effusion noted posteriorly.  IMPRESSION: Right-sided pacer placement without complication.  Trace pleural effusions.   Original Report Authenticated By: Genevive Bi, M.D.     Telemetry: predominantly AV paced; no arrhythmias Device interrogation: this AM shows  normal PPM function with stable lead measurements   Assessment and Plan  1. CHB s/p PPM implant yesterday - doing well post PPM; transfer to telemetry today 2. Aortic stenosis 3. CAD - stable without anginal symptoms; s/p PCI/DES to prox RCA, PTCA to mid LAD May 2013; continue medical therapy (dual antiplatelet therapy recommended for at least one year - PLTs ~75,000 at the time of cath) 4. ESRD on HD - per Nephrology 5. Anemia of chronic disease - per Nephrology; platelet count stable; will monitor implant site/wound closely - no sign of significant bleeding at this time  Signed, EDMISTEN, BROOKE PA-C  I have seen, examined the patient, and reviewed the above assessment and plan.  Changes to above are made where necessary.  Doing well s/p PPM.  Interrogation of the device is reviewed and normal.  GXT reveals no ptx.  Stable leads.  Will ask PT to see.  Probable discharge tomorrow.  Co Sign: [name], MD 12/11/2011 2:52 PM

## 2011-12-11 NOTE — Procedures (Signed)
Patient was seen on dialysis and the procedure was supervised. BFR 400 Via AVG in left forearm BP is 123/106.  Patient appears to be tolerating treatment well but had some cramping which she reports is common for her.  K was 4.4 and she is below her EDw.  Cont to follow.  Hopeful d/c tomorrow per cardiology

## 2011-12-12 DIAGNOSIS — I359 Nonrheumatic aortic valve disorder, unspecified: Secondary | ICD-10-CM

## 2011-12-12 DIAGNOSIS — I2510 Atherosclerotic heart disease of native coronary artery without angina pectoris: Secondary | ICD-10-CM

## 2011-12-12 LAB — GLUCOSE, CAPILLARY
Glucose-Capillary: 112 mg/dL — ABNORMAL HIGH (ref 70–99)
Glucose-Capillary: 149 mg/dL — ABNORMAL HIGH (ref 70–99)
Glucose-Capillary: 155 mg/dL — ABNORMAL HIGH (ref 70–99)

## 2011-12-12 MED ORDER — POLYETHYLENE GLYCOL 3350 17 G PO PACK
17.0000 g | PACK | Freq: Every day | ORAL | Status: DC | PRN
  Filled 2011-12-12 (×2): qty 1

## 2011-12-12 NOTE — Clinical Social Work Placement (Addendum)
    Clinical Social Work Department CLINICAL SOCIAL WORK PLACEMENT NOTE 12/12/2011  Patient:  Melissa [name], Melissa [name]  Account Number:  [id number] Admit date:  12/08/2011  Clinical Social Worker:  Rozetta Nunnery, Theresia Majors  Date/time:  12/12/2011 01:47 PM  Clinical Social Work is seeking post-discharge placement for this patient at the following level of care:   SKILLED NURSING   (*CSW will update this form in Epic as items are completed)   12/12/2011  Patient/family provided with [facility] Department of Clinical Social Work's list of facilities offering this level of care within the geographic area requested by the patient (or if unable, by the patient's family).  12/12/2011  Patient/family informed of their freedom to choose among providers that offer the needed level of care, that participate in Medicare, Medicaid or managed care program needed by the patient, have an available bed and are willing to accept the patient.  12/12/2011  Patient/family informed of [facility]' ownership interest in [facility], as well as of the fact that they are under no obligation to receive care at this facility.  PASARR submitted to EDS on 02/02/2010 PASARR number received from EDS on 02/02/2010  FL2 transmitted to all facilities in geographic area requested by pt/family on  12/12/2011 FL2 transmitted to all facilities within larger geographic area on   Patient informed that his/her managed care company has contracts with or will negotiate with  certain facilities, including the following:     Patient/family informed of bed offers received:  12/12/11 Patient chooses bed at [facility] Physician recommends and patient chooses bed at    Patient to be transferred to [facility] on 12/03/11    Patient to be transferred to facility by [facility] triad ambulance  The following physician request were entered in Epic:   Additional Comments:

## 2011-12-12 NOTE — Clinical Social Work Note (Signed)
Clinical Social Worker received response back from [facility] and they will be able to receive patient tomorrow after patient gets dialysis. CSW will continue to follow.   Rozetta Nunnery MSW, [employer] [phone]

## 2011-12-12 NOTE — Clinical Social Work Psychosocial (Signed)
     Clinical Social Work Department BRIEF PSYCHOSOCIAL ASSESSMENT 12/12/2011  Patient:  MALALA, TRENKAMP     Account Number:  [id number]     Admit date:  12/08/2011  Clinical Social Worker:  Hulan Fray  Date/Time:  12/12/2011 01:38 PM  Referred by:  Care Management  Date Referred:  12/12/2011 Referred for  SNF Placement   Other Referral:   Interview type:  Patient Other interview type:    PSYCHOSOCIAL DATA Living Status:  ALONE Admitted from facility:   Level of care:   Primary support name:  Monalisa Bayless Primary support relationship to patient:  CHILD, ADULT Degree of support available:   adequate    CURRENT CONCERNS Current Concerns  Post-Acute Placement   Other Concerns:    SOCIAL WORK ASSESSMENT / PLAN Clinical Social Worker received referral for request for SNF placement for patient. CSW spoke with patient and introduced self and explained reason for referral. Patient reported that she was interested in [facility] CSW will initiated SNF search and complete FL2 for MD's signature. CSW will update patient when bed offers are made.   Assessment/plan status:  Psychosocial Support/Ongoing Assessment of Needs Other assessment/ plan:   Information/referral to community resources:   SNF packet    PATIENTS/FAMILYS RESPONSE TO PLAN OF CARE: Patient is agreeable to SNF placement and preferring [facility] SNF if available.       Rozetta Nunnery MSW, LCSWA ([place]) [phone]

## 2011-12-12 NOTE — Evaluation (Signed)
Melissa [name],PT Acute Rehabilitation [phone] (pager)  

## 2011-12-12 NOTE — Progress Notes (Signed)
Patient: Melissa [name] Date of Encounter: 12/12/2011, 8:46 AM Admit date: 12/08/2011     Subjective  She has no complaints. She denies CP or SOB.    Objective  Physical Exam: Vitals: BP 122/89  Pulse 77  Temp 99.5 F (37.5 C) (Oral)  Resp 18  Ht 5\' 4"  (1.626 m)  Wt 204 lb 12.8 oz (92.897 kg)  BMI 35.15 kg/m2  SpO2 97% General: Well developed, pale 71 year old female in no acute distress. Neck: Supple. JVD not elevated. Lungs: Clear bilaterally to auscultation anteriorly without wheezes, rales, or rhonchi. Breathing is unlabored. Heart: RRR. S1 S2 present with III/VI systolic murmur. No diastolic murmur, rub or gallop.  Abdomen: Soft, non-distended. Extremities: No clubbing or cyanosis. No edema.   Neuro: Alert and oriented X 3. Moves all extremities spontaneously. No focal deficits. Skin: Upper chest wounds intact with pressure dressings in place. No significant bleeding or hematoma.  Intake/Output:  Intake/Output Summary (Last 24 hours) at 12/12/11 0846 Last data filed at 12/11/11 1700  Gross per 24 hour  Intake    240 ml  Output    135 ml  Net    105 ml    Inpatient Medications:     . amLODipine  5 mg Oral Daily  . aspirin EC  81 mg Oral Daily  . atorvastatin  20 mg Oral q1800  . cinacalcet  30 mg Oral Daily  . clopidogrel  75 mg Oral Daily  . doxazosin  8 mg Oral QHS  . hydrALAZINE  50 mg Oral BID  . insulin aspart  0-5 Units Subcutaneous QHS  . insulin aspart  0-9 Units Subcutaneous TID WC  . insulin glargine  10 Units Subcutaneous QHS  . irbesartan  300 mg Oral Daily  . pantoprazole  40 mg Oral Daily  . polyethylene glycol powder  68 g Oral Custom  . sevelamer  3,200 mg Oral TID WC  . sodium chloride  3 mL Intravenous Q12H  . sodium chloride  3 mL Intravenous Q12H  . sodium polystyrene  15 g Oral Custom      Labs:  [facility] 12/11/11 0731 12/10/11 0628  NA 140 141  K 4.4 4.3  CL 99 100  CO2 28 32  GLUCOSE 101* 110*  BUN 62* 46*    CREATININE 8.57* 6.92*  CALCIUM 9.0 9.3  MG -- --  PHOS 5.2* --    Basename 12/11/11 0731  AST --  ALT --  ALKPHOS --  BILITOT --  PROT --  ALBUMIN 2.9*    Basename 12/11/11 0731 12/10/11 0628  WBC 6.7 5.4  NEUTROABS -- --  HGB 8.3* 8.1*  HCT 25.7* 25.9*  MCV 103.6* 107.0*  PLT 96* 85*   No results found for this basename: CKTOTAL:4,CKMB:4,TROPONINI:4 in the last 72 hours No results found for this basename: HGBA1C in the last 72 hours No results found for this basename: CHOL,HDL,LDLCALC,TRIG,CHOLHDL in the last 72 hours No results found for this basename: TSH,T4TOTAL,FREET3,T3FREE,THYROIDAB in the last 72 hours  Radiology/Studies: Dg Chest 2 View  12/11/2011  *RADIOLOGY REPORT*  Clinical Data: Post pacemaker insertion  CHEST - 2 VIEW  Comparison: Chest radiograph 12/08/2011  Findings: Right-sided pacemaker with two leads overlie stable cardiac silhouette.  No evidence of pneumothorax.  Mild central venous pulmonary congestion unchanged. Trace pleural effusion noted posteriorly.  IMPRESSION: Right-sided pacer placement without complication.  Trace pleural effusions.   Original Report Authenticated By: Genevive Bi, M.D.     Telemetry:  predominantly AV paced; no arrhythmias   Assessment and Plan  1. CHB s/p PPM implant this admit- doing well post PPM  2. Aortic stenosis 3. CAD - stable without anginal symptoms; s/p PCI/DES to prox RCA, PTCA to mid LAD May 2013; continue medical therapy (dual antiplatelet therapy recommended for at least one year - PLTs ~75,000 at the time of cath) 4. ESRD on HD - per Nephrology 5. Anemia of chronic disease - per Nephrology; platelet count stable; will monitor implant site/wound closely - no sign of significant bleeding at this time   Will ask PT to see.  Hopefully, she can discharge to home later today.  Fayrene Fearing Whitni Pasquini,MD

## 2011-12-12 NOTE — Progress Notes (Signed)
12/12/2011 10:40 AM Nursing note BP in Left leg 117/43 Right leg 95/44. Both arms restricted. Adella Hare [facility] made aware of readings. Verbal orders received to hold hydralazine and amlodipine this am. Will continue to closely monitor patient and BP.

## 2011-12-12 NOTE — Evaluation (Signed)
Physical Therapy Evaluation Patient Details Name: Melissa [name] MRN: [id number] DOB: [birth year] Today's Date: 12/12/2011 Time: 2841-3244 PT Time Calculation (min): 24 min  PT Assessment / Plan / Recommendation Clinical Impression  Pt s/p pacemaker placement (12/10/2011) with PMH of CHB, CAD, ESRD on HD, and DM.  Pt presented in her chair stating she feels weak and was afraid to move her right arm.  Upon evaluation she has generalized weakness bilateral LE and decreased ability to sit/stand compared to PLOF per pt report.  Pt walked 16ft in the room on RA and was limited by fatigue.  Pt will benefit from continued skilled physical therapy services.  Recommend SNF at this time as pt will not have 24 hr care at home.  Pt agreed that she needs to improve her strength prior to returning home alone.      PT Assessment  Patient needs continued PT services    Follow Up Recommendations  SNF    Does the patient have the potential to tolerate intense rehabilitation      Barriers to Discharge Decreased caregiver support      Equipment Recommendations  None recommended by PT    Recommendations for Other Services     Frequency Min 3X/week    Precautions / Restrictions Precautions Precautions: ICD/Pacemaker Restrictions Weight Bearing Restrictions: No   Pertinent Vitals/Pain No pain HR 86bpm at rest, 102bpm with activity O2 sats 97-100% at rest and with activity      Mobility  Bed Mobility Bed Mobility: Not assessed Transfers Transfers: Sit to Stand;Stand to Sit Sit to Stand: 3: Mod assist Stand to Sit: 4: Min assist Details for Transfer Assistance: req. vc for proper and safe hand placement as well as body positioning for successful sit/stand.  Ambulation/Gait Ambulation/Gait Assistance: 3: Mod assist Ambulation Distance (Feet): 25 Feet Assistive device: Rolling walker Ambulation/Gait Assistance Details: req vc for posture, RW placement, and body positioned within RW.   Gait  Pattern: Step-to pattern;Decreased stride length;Trunk flexed Gait velocity: slowed  General Gait Details: Pt states she feels weak and tired throughout transfer and ambulation.  Stairs: No Wheelchair Mobility Wheelchair Mobility: No    Shoulder Instructions  Pt instructed on shoulder precautions after pacemaker placement.  She was encouraged to use her right arm within the allowed ROM and to keep her elbow below her shoulder.     Exercises General Exercises - Lower Extremity Ankle Circles/Pumps: AROM;Both;10 reps Quad Sets: Strengthening;Both;5 reps (Pt encouraged to perform exercises throughout day) Long Arc Quad: AROM;5 reps;Both   PT Diagnosis: Generalized weakness  PT Problem List: Decreased strength;Decreased range of motion;Decreased activity tolerance;Decreased balance;Decreased mobility;Decreased coordination;Decreased cognition;Decreased knowledge of use of DME;Pain PT Treatment Interventions: DME instruction;Gait training;Functional mobility training;Therapeutic activities;Therapeutic exercise;Balance training   PT Goals Acute Rehab PT Goals PT Goal Formulation: With patient Time For Goal Achievement: 12/12/11 Potential to Achieve Goals: Good Pt will go Sit to Stand: with modified independence PT Goal: Sit to Stand - Progress: Goal set today Pt will go Stand to Sit: with modified independence PT Goal: Stand to Sit - Progress: Goal set today Pt will Transfer Bed to Chair/Chair to Bed: with modified independence PT Transfer Goal: Bed to Chair/Chair to Bed - Progress: Goal set today Pt will Ambulate: 16 - 50 feet;with modified independence;with rolling walker PT Goal: Ambulate - Progress: Goal set today  Visit Information  Last PT Received On: 12/12/11 Assistance Needed: +1    Subjective Data  Subjective: "I'm ok, but this is only the second time  I've been out of bed and I feel weak."  Patient Stated Goal: to go home   Prior Functioning  Home Living Lives With: Alone  (Aid assist pt every afternoon with daily chores) Available Help at Discharge: Family;Personal care attendant;Available PRN/intermittently Type of Home: House Home Access: Ramped entrance Home Layout: One level Bathroom Shower/Tub: Walk-in shower;Door [employer] Toilet: Handicapped height Bathroom Accessibility: No Home Adaptive Equipment: Engineer, drilling - rolling;Straight cane;Other (comment) (lift chair; pt states she has but does not use) Prior Function Level of Independence: Independent with assistive device(s) Needs Assistance: Light Housekeeping Light Housekeeping: Moderate (Aid assists with daily chores) Able to Take Stairs?: No Driving: Yes Comments: Pt reports that prior to admission she was driving and able to manage self care independently, but has an aid who comes to the house every afternoon to help with daily chores such as laundry, cooking, cleaning, etc.  Communication Communication: No difficulties Dominant Hand: Right    Cognition  Overall Cognitive Status: Appears within functional limits for tasks assessed/performed Arousal/Alertness: Awake/alert Orientation Level: Appears intact for tasks assessed Behavior During Session: [facility] for tasks performed    Extremity/Trunk Assessment Right Upper Extremity Assessment RUE ROM/Strength/Tone: Deficits RUE ROM/Strength/Tone Deficits: Pt is scared to move R arm at all; PT explained precautions and encouraged pt to move arm through allowed ROM (elbow should not go above shoulder height).  RUE Sensation: WFL - Light Touch Left Upper Extremity Assessment LUE ROM/Strength/Tone: WFL for tasks assessed LUE Sensation: WFL - Light Touch Right Lower Extremity Assessment RLE ROM/Strength/Tone: Deficits RLE ROM/Strength/Tone Deficits: 4-/5 for hip flexion, knee extension, dorsiflexion, and plantar flexion; pt states R leg is weaker than left due to past stroke, however her strength appears weak but equal bilaterally.  RLE  Sensation: WFL - Light Touch Left Lower Extremity Assessment LLE ROM/Strength/Tone: Deficits LLE ROM/Strength/Tone Deficits: 4-/5 for hip flexion, knee extension, dorsiflexion, and plantar flexion; pt states R leg is weaker than left due to past stroke, however her strength appears weak but equal bilaterally.  LLE Sensation: WFL - Light Touch Trunk Assessment Trunk Assessment: Normal   Balance Balance Balance Assessed: No  End of Session PT - End of Session Equipment Utilized During Treatment: Gait belt Activity Tolerance: Patient limited by fatigue Patient left: in chair;with call bell/phone within reach;with nursing in room Nurse Communication: Mobility status       Sharion Balloon 12/12/2011, 11:29 AM Sharion Balloon, SPT Acute Rehab Services [phone]

## 2011-12-12 NOTE — Progress Notes (Signed)
Patient ID: Melissa [name], female   DOB: October 01, 1940, 71 y.o.   MRN: [id number]  [facility] Progress Note    Subjective:   No new complaints   Objective:   BP 117/45  Pulse 77  Temp 99.5 F (37.5 C) (Oral)  Resp 18  Ht 5\' 4"  (1.626 m)  Wt 92.897 kg (204 lb 12.8 oz)  BMI 35.15 kg/m2  SpO2 97%  Physical Exam: Gen:WD WN obese WF in NAD CVS:III/VI SEM, harsh Resp:CTA NWG:NFAOZ, NT Ext:no edema, AVG +T/B  Labs: BMET  Lab 12/11/11 0731 12/10/11 0628 12/09/11 12/08/11 1307  NA 140 141 139 137  K 4.4 4.3 3.8 4.3  CL 99 100 99 99  CO2 28 32 34* 27  GLUCOSE 101* 110* 116* 101*  BUN 62* 46* 17 38*  CREATININE 8.57* 6.92* 3.88* 6.63*  ALBUMIN 2.9* -- -- 3.3*  CALCIUM 9.0 9.3 9.0 9.3  PHOS 5.2* -- -- --   CBC  Lab 12/11/11 0731 12/10/11 0628 12/09/11 12/08/11 1307  WBC 6.7 5.4 5.8 5.5  NEUTROABS -- -- -- 4.1  HGB 8.3* 8.1* 8.3* 8.1*  HCT 25.7* 25.9* 26.4* 25.7*  MCV 103.6* 107.0* 106.9* 104.9*  PLT 96* 85* 83* 85*    @IMGRELPRIORS @ Medications:      . amLODipine  5 mg Oral Daily  . aspirin EC  81 mg Oral Daily  . atorvastatin  20 mg Oral q1800  . cinacalcet  30 mg Oral Daily  . clopidogrel  75 mg Oral Daily  . doxazosin  8 mg Oral QHS  . hydrALAZINE  50 mg Oral BID  . insulin aspart  0-5 Units Subcutaneous QHS  . insulin aspart  0-9 Units Subcutaneous TID WC  . insulin glargine  10 Units Subcutaneous QHS  . irbesartan  300 mg Oral Daily  . pantoprazole  40 mg Oral Daily  . polyethylene glycol powder  68 g Oral Custom  . sevelamer  3,200 mg Oral TID WC  . sodium chloride  3 mL Intravenous Q12H  . sodium chloride  3 mL Intravenous Q12H  . sodium polystyrene  15 g Oral Custom     Assessment/ Plan:   1. Complete heart block- s/p PPM 2. Hyperkalemia- resolved 3. ESRD, cont hd TTS 4. Hx CAD by cath 2011, medical Rx / moderate AS- on asa and plavix 5. DNR/DNI 6. HTN/Volume- on amlodipine/carvedilol/cardura/hydralazine/clonidine/olmesartan at  home. Primary svc has stopped the coreg and clonidine for now. Tolerated 2kg off Sat with late BP drop. Next HD Tuesday. No vol excess, below EDW.  7. HPTH- renagel 4ac and 2 w snacks, continue 8. Anemia of CKD- need to get EPO dose on Monday. Hb 8-9 range 9. Dispo- per cardiology, hopefully to SNF tomorrow.  Will plan HD early tomorrow in case she can be discharged. 10.  Melissa [name] A 12/12/2011, 1:19 PM

## 2011-12-13 LAB — RENAL FUNCTION PANEL
Albumin: 2.7 g/dL — ABNORMAL LOW (ref 3.5–5.2)
BUN: 51 mg/dL — ABNORMAL HIGH (ref 6–23)
CO2: 29 mEq/L (ref 19–32)
Chloride: 98 mEq/L (ref 96–112)
GFR calc non Af Amer: 5 mL/min — ABNORMAL LOW (ref >90)
Potassium: 3.9 mEq/L (ref 3.5–5.1)

## 2011-12-13 LAB — CBC
HCT: 24 % — ABNORMAL LOW (ref 36.0–46.0)
RBC: 2.31 MIL/uL — ABNORMAL LOW (ref 3.87–5.11)
RDW: 14.6 % (ref 11.5–15.5)
WBC: 5.7 10*3/uL (ref 4.0–10.5)

## 2011-12-13 LAB — GLUCOSE, CAPILLARY: Glucose-Capillary: 116 mg/dL — ABNORMAL HIGH (ref 70–99)

## 2011-12-13 LAB — PREPARE RBC (CROSSMATCH)

## 2011-12-13 MED ORDER — ACETAMINOPHEN 325 MG PO TABS
ORAL_TABLET | ORAL | Status: AC
  Filled 2011-12-13: qty 2

## 2011-12-13 MED ORDER — ALBUMIN HUMAN 25 % IV SOLN
INTRAVENOUS | Status: AC
  Administered 2011-12-13: 12.5 g
  Filled 2011-12-13: qty 50

## 2011-12-13 MED ORDER — CARVEDILOL 3.125 MG PO TABS: 3.1250 mg | ORAL_TABLET | Freq: Two times a day (BID) | ORAL | Status: DC

## 2011-12-13 MED ORDER — ATORVASTATIN CALCIUM 20 MG PO TABS: 20.0000 mg | ORAL_TABLET | Freq: Every day | ORAL | Status: DC

## 2011-12-13 MED ORDER — ALBUMIN HUMAN 25 % IV SOLN: 12.5000 g | Freq: Once | INTRAVENOUS | Status: DC

## 2011-12-13 NOTE — Clinical Social Work Note (Signed)
Clinical Social Worker facilitated discharge by contacting facility, [facility] regarding discharge today. [facility]. Patient's discharge packet will be placed with shadow chart. CSW will sign off, as social work intervention is no longer needed.   Rozetta Nunnery MSW, LCSWA ([place]) [phone]

## 2011-12-13 NOTE — Progress Notes (Signed)
Patient: Melissa [name] Date of Encounter: 12/13/2011, 9:59 AM Admit date: 12/08/2011     Subjective  [name] is on dialysis currently. She was feeling dizzy with hypotension and is now being given albumin with improved BP. She is also scheduled to received pRBCs x 1 while on dialysis. She reports mild soreness at implant site. She has no other complaints. She denies SOB.   Objective  Physical Exam: Vitals: BP 110/48  Pulse 79  Temp 98.5 F (36.9 C) (Oral)  Resp 20  Ht 5\' 4"  (1.626 m)  Wt 202 lb 6.1 oz (91.8 kg)  BMI 34.74 kg/m2  SpO2 95% General: Well developed, pale 71 year old female in no acute distress. On dialysis. Neck: Supple. JVD not elevated. Lungs: Clear bilaterally to auscultation without wheezes, rales, or rhonchi. Breathing is unlabored. Heart: RRR S1 S2 with III/VI systolic murmur. No rub or gallop.  Abdomen: Soft, non-distended. Extremities: No clubbing or cyanosis. No edema.  Distal pedal pulses are 2+ and equal bilaterally. Neuro: Alert and oriented X 3. Moves all extremities spontaneously. No focal deficits. Skin: Upper chest wounds intact with Tegaderm in place. No significant bleeding or hematoma.  Intake/Output:  Intake/Output Summary (Last 24 hours) at 12/13/11 0959 Last data filed at 12/12/11 1700  Gross per 24 hour  Intake    480 ml  Output      0 ml  Net    480 ml    Inpatient Medications:     . acetaminophen      . albumin human  12.5 g Intravenous Once  . [COMPLETED] albumin human      . amLODipine  5 mg Oral Daily  . aspirin EC  81 mg Oral Daily  . atorvastatin  20 mg Oral q1800  . cinacalcet  30 mg Oral Daily  . clopidogrel  75 mg Oral Daily  . doxazosin  8 mg Oral QHS  . hydrALAZINE  50 mg Oral BID  . insulin aspart  0-5 Units Subcutaneous QHS  . insulin aspart  0-9 Units Subcutaneous TID WC  . insulin glargine  10 Units Subcutaneous QHS  . irbesartan  300 mg Oral Daily  . pantoprazole  40 mg Oral Daily  . polyethylene  glycol powder  68 g Oral Custom  . sevelamer  3,200 mg Oral TID WC  . sodium chloride  3 mL Intravenous Q12H  . sodium chloride  3 mL Intravenous Q12H  . sodium polystyrene  15 g Oral Custom    Labs:  [facility] 12/13/11 0844 12/11/11 0731  NA 140 140  K 3.9 4.4  CL 98 99  CO2 29 28  GLUCOSE 155* 101*  BUN 51* 62*  CREATININE 6.96* 8.57*  CALCIUM 9.0 9.0  MG -- --  PHOS 3.9 5.2*    Basename 12/13/11 0844 12/11/11 0731  AST -- --  ALT -- --  ALKPHOS -- --  BILITOT -- --  PROT -- --  ALBUMIN 2.7* 2.9*    Basename 12/13/11 0844 12/11/11 0731  WBC 5.7 6.7  NEUTROABS -- --  HGB 7.8* 8.3*  HCT 24.0* 25.7*  MCV 103.9* 103.6*  PLT 83* 96*    Radiology/Studies: Dg Chest 2 View  12/11/2011  *RADIOLOGY REPORT*  Clinical Data: Post pacemaker insertion  CHEST - 2 VIEW  Comparison: Chest radiograph 12/08/2011  Findings: Right-sided pacemaker with two leads overlie stable cardiac silhouette.  No evidence of pneumothorax.  Mild central venous pulmonary congestion unchanged. Trace pleural effusion noted  posteriorly.  IMPRESSION: Right-sided pacer placement without complication.  Trace pleural effusions.   Original Report Authenticated By: Genevive Bi, M.D.    Dg Chest Port 1 View  12/08/2011  *RADIOLOGY REPORT*  Clinical Data: Admission  PORTABLE CHEST - 1 VIEW  Comparison: 12/08/2011  Findings: Cardiomediastinal silhouette is stable.  Stable chronic interstitial prominence.  No convincing pulmonary edema.  Mild basilar atelectasis.  No segmental infiltrate.  IMPRESSION: Stable chronic interstitial prominence.  No convincing pulmonary edema.  Mild basilar atelectasis.  No segmental infiltrate.   Original Report Authenticated By: Natasha Mead, M.D.     Telemetry: predominantly AV paced; no arrhythmias   Assessment and Plan  1. CHB s/p PPM implant this admit- doing well post PPM  2. Aortic stenosis  3. CAD - stable without anginal symptoms; s/p PCI/DES to prox RCA, PTCA to mid LAD  May 2013; continue medical therapy (dual antiplatelet therapy recommended for at least one year - PLTs ~75,000 at the time of cath)  4. ESRD on HD - per Nephrology  5. Anemia of chronic disease - per Nephrology; scheduled to receive pRBCs x 1 while on dialysis this AM; will continue to monitor implant site/wound closely - no sign of significant bleeding at this time   Signed, EDMISTEN, BROOKE PA-C  I have seen, examined the patient, and reviewed the above assessment and plan.  Changes to above are made where necessary.  She feels washed out after HD but dizziness is improved.  Denies CP, SOB, or any new concerns.  She feels ready for discharge.  Will DC to SNF today Routine wound care and follow-up She should also follow-up with Dr Myrtis Ser in 2-3 weeks She should hold doxazosin and amlodipine on days of dialysis Resume coreg 3.125mg  BID at this time.  Co Sign: [name], MD 12/13/2011 12:29 PM

## 2011-12-13 NOTE — Progress Notes (Signed)
Pt discharging to SNF at [facility], via non emergent ambulance.

## 2011-12-13 NOTE — Progress Notes (Signed)
Patient ID: Melissa [name], female   DOB: [birth year], 71 y.o.   MRN: [id number]  [facility] Progress Note    Subjective:   Feels a little dizzy.  Patient was seen on dialysis and the procedure was supervised. BFR 400 Via AVG BP is 86/41.  Patient appears to be doing fairly well despite low bp and will give some albumin.  Pt reports chronic low bp with HD as an outpt     Objective:   BP 86/41  Pulse 88  Temp 98.5 F (36.9 C) (Oral)  Resp 22  Ht 5\' 4"  (1.626 m)  Wt 91.8 kg (202 lb 6.1 oz)  BMI 34.74 kg/m2  SpO2 95%  Physical Exam: Gen:WD WN obese WF in NAD CVS:RRR Resp:CTA WUJ:[id number] Ext:no edema, AVG +T/B  Labs: BMET  Lab 12/11/11 0731 12/10/11 0628 12/09/11 12/08/11 1307  NA 140 141 139 137  K 4.4 4.3 3.8 4.3  CL 99 100 99 99  CO2 28 32 34* 27  GLUCOSE 101* 110* 116* 101*  BUN 62* 46* 17 38*  CREATININE 8.57* 6.92* 3.88* 6.63*  ALBUMIN 2.9* -- -- 3.3*  CALCIUM 9.0 9.3 9.0 9.3  PHOS 5.2* -- -- --   CBC  Lab 12/11/11 0731 12/10/11 0628 12/09/11 12/08/11 1307  WBC 6.7 5.4 5.8 5.5  NEUTROABS -- -- -- 4.1  HGB 8.3* 8.1* 8.3* 8.1*  HCT 25.7* 25.9* 26.4* 25.7*  MCV 103.6* 107.0* 106.9* 104.9*  PLT 96* 85* 83* 85*    @IMGRELPRIORS @ Medications:      . acetaminophen      . albumin human  12.5 g Intravenous Once  . [COMPLETED] albumin human      . amLODipine  5 mg Oral Daily  . aspirin EC  81 mg Oral Daily  . atorvastatin  20 mg Oral q1800  . cinacalcet  30 mg Oral Daily  . clopidogrel  75 mg Oral Daily  . doxazosin  8 mg Oral QHS  . hydrALAZINE  50 mg Oral BID  . insulin aspart  0-5 Units Subcutaneous QHS  . insulin aspart  0-9 Units Subcutaneous TID WC  . insulin glargine  10 Units Subcutaneous QHS  . irbesartan  300 mg Oral Daily  . pantoprazole  40 mg Oral Daily  . polyethylene glycol powder  68 g Oral Custom  . sevelamer  3,200 mg Oral TID WC  . sodium chloride  3 mL Intravenous Q12H  . sodium chloride  3 mL Intravenous Q12H  .  sodium polystyrene  15 g Oral Custom     Assessment/ Plan:   1. Complete heart block- s/p PPM 2. Hyperkalemia- resolved 3. ESRD, cont hd TTS 4. Hx CAD by cath 2011, medical Rx / moderate AS- on asa and plavix 5. DNR/DNI 6. HTN/Volume- on amlodipine/carvedilol/cardura/hydralazine/clonidine/olmesartan at home. Primary svc has stopped the coreg and clonidine for now. Tolerated 2kg off Sat with late BP drop. Next HD Tuesday. No vol excess, below EDW.  7. HPTH- renagel 4ac and 2 w snacks, continue 8. Anemia of CKD- need to get EPO dose on Monday. Hb 8-9 range 9. Dispo- per cardiology, to SNF today  Shakera Ebrahimi A 12/13/2011, 8:36 AM

## 2011-12-13 NOTE — Discharge Summary (Signed)
ELECTROPHYSIOLOGY DISCHARGE SUMMARY    Patient ID: Melissa [name],  MRN: [id number], DOB/AGE: 03/26/1940 71 y.o.  Admit date: 12/08/2011 Discharge date: 12/13/2011  Primary Care Physician: Donzetta Sprung, MD Primary Cardiologist: Willa Rough, MD  Primary Discharge Diagnosis:  1. Complete heart block s/p dual chamber PPM implantation 2. ESRD on HD 3. Anemia of chronic disease  Secondary Discharge Diagnoses:  1. CAD 2. Aortic stenosis 3. Chronic diastolic HF 4. HTN 5. Dyslipidemia 6. DM 7. Prior CVA  Procedures This Admission:  1. Dual chamber PPM implantation 12/10/2011 Through the right axillary vein, a [facility] model [phone] (serial number [id number]) right atrial lead and a [facility] model 2088- [id number] (serial number [id number]) right ventricular lead were advanced with fluoroscopic visualization into the right atrial appendage and right ventricular apex positions respectively. [address] Jude Medical Accent DR RF model [id number] (serial number [id number]) pacemaker.  Conclusions:  1. Successful implantation of a Industrial/product designer DR RF dual-chamber pacemaker for complete heart block  2. An initial attempt of placement on the left side could not be performed due to persistent left superior vena cava and the device is therefore implanted on the right side.  3. No early apparent complications.   History and Hospital Course:  Melissa [name] is a pleasant 71 year old woman with CAD, AS, chronic diastolic HF, ESRD on HD, HTN, DM and prior CVA who was admitted 12/08/2011 with symptomatic complete heart block with rates in the 30s. She was mildly hyperkalemic which was corrected. Nephrology consultation was obtained and she was taken urgently to dialysis. She did not require temporary pacing and remained hemodynamically stable. MI was ruled out with serial negative cardiac enzymes. Her AV nodal blocking medications were discontinued; however, her rhythm did not improve. Permanent pacing was  recommended. On 12/10/2011, she underwent PPM implantation. Please see operative report below for full details. Melissa [name] tolerated this procedure well without any immediate complication. She remains hemodynamically stable and afebrile. Her chest xray showed stable lead placement without pneumothorax. Her device interrogation was reviewed by Dr. Johney Frame and shows normal PPM function with stable lead parameters/measurements. Her implant site/wound is intact without significant bleeding or hematoma. She was evaluated by PT who recommended SNF for rehabilitation prior to returning home where she lives alone. Case management has arranged [facility]. She has continued dialysis while here under the supervision of Dr. Lenna Sciara. She has known hypotension during HD and received albumin and pRBCs x1 during HD today, 12/13/2011. Per Dr. Lenna Sciara, she is stable for discharge to SNF today from a nephrology perspective. Her Hgb/Hct and platelets will be followed as an outpatient by her nephrologist in [place], [place]. She was instructed to hold her antihypertensive medications on her dialysis days which she states she is already doing. Regarding her PPM, she has been given discharge instructions including wound care and activity restrictions. She will follow-up in 10 days for wound check. She has been seen, examined and deemed stable for discharge today to [facility] by Dr. [name].    Discharge Vitals: Blood pressure 137/68, pulse 79, temperature 98.1 F (36.7 C), temperature source Oral, resp. rate 23, height 5\' 4"  (1.626 m), weight 203 lb 11.3 oz (92.4 kg), SpO2 98.00%.   Labs: Lab Results  Component Value Date   WBC 5.7 12/13/2011   HGB 7.8* 12/13/2011   HCT 24.0* 12/13/2011   MCV 103.9* 12/13/2011   PLT 83* 12/13/2011     Lab 12/13/11 0844 12/08/11 1307  NA 140 --  K 3.9 --  CL 98 --  CO2 29 --  BUN 51* --  CREATININE 6.96* --  CALCIUM 9.0 --  PROT -- 5.8*  BILITOT -- 0.2*  ALKPHOS -- 119*    ALT -- 11  AST -- 13  GLUCOSE 155* --   Lab Results  Component Value Date   CKTOTAL 32 12/09/2011   CKMB 1.9 12/09/2011   TROPONINI <0.30 12/09/2011    Lab Results  Component Value Date   CHOL 90 12/09/2011   CHOL 127 03/03/2011   Lab Results  Component Value Date   HDL 29* 12/09/2011   HDL 27* 03/03/2011   Lab Results  Component Value Date   LDLCALC 33 12/09/2011   LDLCALC 63 03/03/2011   Lab Results  Component Value Date   TRIG 142 12/09/2011   TRIG 184* 03/03/2011   Lab Results  Component Value Date   CHOLHDL 3.1 12/09/2011   CHOLHDL 4.7 03/03/2011    Disposition:  The patient is being discharged in stable condition.  Follow-up:     Follow-up Information    Follow up with [facility] On 12/20/2011. (At 3:00 PM for wound check)    Contact information:   [address]  [address] [place] [address] [phone]      Follow up with [name], MD. On 03/19/2012. (At 8:45 AM)    Contact information:   [address] [place] [address] [phone]      Follow up with [facility], MD. On 12/18/2011. (As scheduled)    Contact information:   [address] [place] [address] [phone]       Follow up with Prescott Parma, PA. On 01/04/2012. (At 1:20 PM for hospital follow-up)    Contact information:   [address] [place] [address] [phone]       Discharge Medications:    Medication List     As of 12/13/2011  1:07 PM    STOP taking these medications         cloNIDine 0.3 mg/24hr   Commonly known as: CATAPRES - Dosed in mg/24 hr      simvastatin 40 MG tablet   Commonly known as: ZOCOR      TAKE these medications         amLODipine 5 MG tablet   Commonly known as: NORVASC   Take 5 mg by mouth daily.      aspirin EC 81 MG tablet   Take 81 mg by mouth daily.      atorvastatin 20 MG tablet   Commonly known as: LIPITOR   Take 1 tablet (20 mg total) by mouth daily at 6 PM.       carvedilol 3.125 MG tablet   Commonly known as: COREG   Take 1 tablet (3.125 mg total) by mouth 2 (two) times daily with a meal.      cinacalcet 30 MG tablet   Commonly known as: SENSIPAR   Take 30 mg by mouth daily.      clopidogrel 75 MG tablet   Commonly known as: PLAVIX   Take 1 tablet (75 mg total) by mouth daily.      DAILY VITAMIN FORMULA PO   Take 3 mg by mouth daily. Folic acid- vitamin b complex - vitamin c - selenium - zinc 3 mg tablet.      doxazosin 8 MG tablet  Commonly known as: CARDURA   Take 8 mg by mouth at bedtime.      hydrALAZINE 50 MG tablet   Commonly known as: APRESOLINE   Take 50 mg by mouth 2 (two) times daily.      insulin glargine 100 UNIT/ML injection   Commonly known as: LANTUS   Inject 10 Units into the skin at bedtime. Patient does not take if blood sugar is 100 or below.      insulin glulisine 100 UNIT/ML injection   Commonly known as: APIDRA   Inject 5-15 Units into the skin 3 (three) times daily before meals. Sliding scale      nitroGLYCERIN 0.4 MG SL tablet   Commonly known as: NITROSTAT   Place 1 tablet (0.4 mg total) under the tongue every 5 (five) minutes as needed for chest pain.      olmesartan 40 MG tablet   Commonly known as: BENICAR   Take 40 mg by mouth daily.      omeprazole 20 MG capsule   Commonly known as: PRILOSEC   Take 20 mg by mouth daily.      polyethylene glycol powder powder   Commonly known as: GLYCOLAX/MIRALAX   Take 68 g by mouth 3 (three) times a week. Tuesday, Thursday, Sunday      sevelamer 800 MG tablet   Commonly known as: RENAGEL   Take 1,600-3,200 mg by mouth See admin instructions. Takes 3200 mg with meals and 1600mg  with each snack      sodium polystyrene 15 GM/60ML suspension   Commonly known as: KAYEXALATE   Take 15 g by mouth 2 (two) times a week. Patient takes 15 g on Saturday and Sunday.        Duration of Discharge Encounter: Greater than 30 minutes including physician  time.  Limmie Patricia, PA-C 12/13/2011, 1:07 PM    [name], MD

## 2011-12-14 LAB — TYPE AND SCREEN: ABO/RH(D): A POS

## 2011-12-20 ENCOUNTER — Ambulatory Visit: Payer: Medicare Other

## 2011-12-24 ENCOUNTER — Encounter: Payer: Self-pay | Admitting: Internal Medicine

## 2011-12-24 ENCOUNTER — Ambulatory Visit (INDEPENDENT_AMBULATORY_CARE_PROVIDER_SITE_OTHER): Payer: Medicare Other | Admitting: *Deleted

## 2011-12-24 DIAGNOSIS — I442 Atrioventricular block, complete: Secondary | ICD-10-CM

## 2011-12-24 LAB — PACEMAKER DEVICE OBSERVATION
AL AMPLITUDE: 2.3 mv
AL IMPEDENCE PM: 437.5 Ohm
AL THRESHOLD: 0.75 V
RV LEAD AMPLITUDE: 12 mv
RV LEAD IMPEDENCE PM: 475 Ohm

## 2011-12-24 NOTE — Progress Notes (Signed)
Wound check-PPM 

## 2012-01-04 ENCOUNTER — Ambulatory Visit (INDEPENDENT_AMBULATORY_CARE_PROVIDER_SITE_OTHER): Payer: Medicare Other | Admitting: Physician Assistant

## 2012-01-04 ENCOUNTER — Encounter: Payer: Self-pay | Admitting: Physician Assistant

## 2012-01-04 VITALS — BP 102/51 | HR 65 | Ht 64.0 in | Wt 198.1 lb

## 2012-01-04 DIAGNOSIS — I2510 Atherosclerotic heart disease of native coronary artery without angina pectoris: Secondary | ICD-10-CM

## 2012-01-04 DIAGNOSIS — I350 Nonrheumatic aortic (valve) stenosis: Secondary | ICD-10-CM

## 2012-01-04 DIAGNOSIS — I509 Heart failure, unspecified: Secondary | ICD-10-CM

## 2012-01-04 DIAGNOSIS — I359 Nonrheumatic aortic valve disorder, unspecified: Secondary | ICD-10-CM

## 2012-01-04 DIAGNOSIS — I442 Atrioventricular block, complete: Secondary | ICD-10-CM

## 2012-01-04 MED ORDER — CARVEDILOL 3.125 MG PO TABS: 3.1250 mg | ORAL_TABLET | Freq: Two times a day (BID) | ORAL | Status: DC

## 2012-01-04 NOTE — Assessment & Plan Note (Signed)
Status post St. Jude dual-chamber PPM. Patient to follow up with Dr. Johney Frame, here in our [place] clinic, in February of next year, as previously scheduled.

## 2012-01-04 NOTE — Addendum Note (Signed)
Addended by: Lesle Chris on: 01/04/2012 02:19 PM   Modules accepted: Orders

## 2012-01-04 NOTE — Progress Notes (Signed)
Primary Cardiologist: Jerral Bonito, MD   HPI: Post hospital followup from [facility], status post implantation of St. Jude dual-chamber PPM, by Dr. Johney Frame, following presentation to [facility] with symptomatic CHB.  Clinically, she denies any complications of the PPM wound site. She is reporting increased exercise tolerance, and denies any exertional CP or DOE. She also denies orthopnea or PND. She is on hemodialysis, and was seen by Dr. Kristian Covey yesterday.   12-lead EKG today, reviewed by me, indicates NSR with atrial pacing at approximately 70 bpm; NSST changes  Allergies  Allergen Reactions  . Codeine Nausea And Vomiting    Current Outpatient Prescriptions  Medication Sig Dispense Refill  . amLODipine (NORVASC) 5 MG tablet Take 5 mg by mouth daily.      Marland Kitchen aspirin EC 81 MG tablet Take 81 mg by mouth daily.      . carvedilol (COREG) 3.125 MG tablet Take 1 tablet (3.125 mg total) by mouth 2 (two) times daily with a meal.  60 tablet  3  . cinacalcet (SENSIPAR) 30 MG tablet Take 30 mg by mouth daily.       . clopidogrel (PLAVIX) 75 MG tablet Take 1 tablet (75 mg total) by mouth daily.  30 tablet  11  . doxazosin (CARDURA) 8 MG tablet Take 8 mg by mouth at bedtime.      . hydrALAZINE (APRESOLINE) 50 MG tablet Take 50 mg by mouth 2 (two) times daily.        . insulin glargine (LANTUS) 100 UNIT/ML injection Inject 10 Units into the skin at bedtime. Patient does not take if blood sugar is 100 or below.      . insulin glulisine (APIDRA) 100 UNIT/ML injection Inject 5-15 Units into the skin 3 (three) times daily before meals. Sliding scale      . Multiple Vitamin (DAILY VITAMIN FORMULA PO) Take 3 mg by mouth daily. Folic acid- vitamin b complex - vitamin c - selenium - zinc 3 mg tablet.      Marland Kitchen olmesartan (BENICAR) 40 MG tablet Take 40 mg by mouth daily.        Marland Kitchen omeprazole (PRILOSEC) 20 MG capsule Take 20 mg by mouth daily.        . polyethylene glycol powder (GLYCOLAX/MIRALAX) powder Take 68 g by mouth 3  (three) times a week. Tuesday, Thursday, Sunday      . sevelamer (RENAGEL) 800 MG tablet Take 1,600-3,200 mg by mouth See admin instructions. Takes 3200 mg with meals and 1600mg  with each snack      . simvastatin (ZOCOR) 40 MG tablet Take 40 mg by mouth every evening.      . sodium polystyrene (KAYEXALATE) 15 GM/60ML suspension Take 15 g by mouth 2 (two) times a week. Patient takes 15 g on Saturday and Sunday.      . nitroGLYCERIN (NITROSTAT) 0.4 MG SL tablet Place 1 tablet (0.4 mg total) under the tongue every 5 (five) minutes as needed for chest pain.  25 tablet  3    Past Medical History  Diagnosis Date  . Coronary artery disease     catheterization March 2011.(presentation wtih mild CHF and mild increased troponin))..70% proximal/80% mid LAD,50% circumflex.Marland KitchenLAD could be approached but with difficulty....medicall therapy recommended/NSTEMI 01/31/2010 with sepsis and anemia...EF 50%...medical Rx,  . Diabetes mellitus   . Hyperlipidemia   . Aortic stenosis     mild to moderate...echo.Marland KitchenMarland KitchenSeptember,2006/catheterization valve area 1.9cm square/mod/severe....echo...10/2009/mod/severe....echo....01/31/2010,  . 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  Hypertension   . Anemia   . CVA (cerebral vascular accident)   . Chronic kidney disease     ESRD...dialysis  . Carotid artery disease     doppler Jul 05, 2009 /  doppler 05/08/2010 similar, <50% bilateral with some plaque  . Edema   . Ejection fraction     EF 50%, cardiac catheterization, January, 2012  . Myocardial infarction   . Anginal pain   . Dysrhythmia   . Heart murmur   . Arthritis   . Complete heart block     St. Jude Medical Accent DR RF dual-chamber PPM (model PM [id number]; serial # [id number])    Past Surgical History  Procedure Date  . Appendectomy   . Oophorectomy   . Toe amputation     left great toe  . Retinal detachment surgery   . Cataract extraction   . Coronary angioplasty   .  Cholecystectomy     History   Social History  . Marital Status: Divorced    Spouse Name: N/A    Number of Children: N/A  . Years of Education: N/A   Occupational History  . Not on file.   Social History Main Topics  . Smoking status: Never Smoker   . Smokeless tobacco: Never Used  . Alcohol Use: No  . Drug Use: No  . Sexually Active: No   Other Topics Concern  . Not on file   Social History Narrative   Lives in [place] alone. Divorced and has four children.Has home health aide 5 days/week   Social History Narrative   Lives in [place] alone. Divorced and has four children.Has home health aide 5 days/week    Problem Relation Age of Onset  . Coronary artery disease Mother     died age 56  . Diabetes type II Mother   . Other Father     died in 07/06/22 of accident  . Diabetes type II Daughter   . Hiatal hernia Son     ROS: no nausea, vomiting; no fever, chills; no melena, hematochezia; no claudication  PHYSICAL EXAM: BP 102/51  Pulse 65  Ht 5\' 4"  (1.626 m)  Wt 198 lb 1.9 oz (89.867 kg)  BMI 34.01 kg/m2  SpO2 98% GENERAL: 71 year old female, obese; NAD HEENT: NCAT, PERRLA, EOMI; sclera clear; no xanthelasma NECK: palpable bilateral carotid pulses, no bruits; no JVD; no TM LUNGS: Faint late crackles CARDIAC: RRR (S1, S2); 3/6 high-pitched crescendo decrescendo murmur at base; no rubs or gallops ABDOMEN: Protuberant EXTREMETIES: 2+ bilateral, nonpitting peripheral edema SKIN: warm/dry; no obvious rash/lesions; PPM wound site stable, well-healed incision MUSCULOSKELETAL: no joint deformity NEURO: no focal deficit; NL affect   EKG: reviewed and available in Electronic Records   ASSESSMENT & PLAN:  Coronary artery disease Remains quiescent since last OV. Continue current medication regimen.  Aortic stenosis We'll order followup echocardiogram in approximately 6 months, for reassessment of severity of AS, prior to her next OV with Dr. Myrtis Ser.  CHF (congestive heart  failure) Stable, on hemodialysis. Weight down 6 pounds since last OV.  CHB (complete heart block) Status post St. Jude dual-chamber PPM. Patient to follow up with Dr. Johney Frame, here in our [place] clinic, in February of next year, as previously scheduled.    Gene Meril Dray, PAC

## 2012-01-04 NOTE — Patient Instructions (Signed)
   Keep already scheduled appointment with Dr. Johney Frame   Echo in 6 months just prior to next visit Continue all current medications. Your physician wants you to follow up in: 6 months.  You will receive a reminder letter in the mail one-two months in advance.  If you don't receive a letter, please call our office to schedule the follow up appointment

## 2012-01-04 NOTE — Assessment & Plan Note (Signed)
We'll order followup echocardiogram in approximately 6 months, for reassessment of severity of AS, prior to her next OV with Dr. Myrtis Ser.

## 2012-01-04 NOTE — Assessment & Plan Note (Signed)
Stable, on hemodialysis. Weight down 6 pounds since last OV.

## 2012-01-04 NOTE — Assessment & Plan Note (Signed)
Remains quiescent since last OV. Continue current medication regimen.

## 2012-01-07 ENCOUNTER — Other Ambulatory Visit: Payer: Self-pay | Admitting: *Deleted

## 2012-01-07 DIAGNOSIS — I2510 Atherosclerotic heart disease of native coronary artery without angina pectoris: Secondary | ICD-10-CM

## 2012-01-11 ENCOUNTER — Encounter: Payer: Self-pay | Admitting: Internal Medicine

## 2012-01-11 ENCOUNTER — Ambulatory Visit (INDEPENDENT_AMBULATORY_CARE_PROVIDER_SITE_OTHER): Payer: Medicare Other | Admitting: *Deleted

## 2012-01-11 DIAGNOSIS — I442 Atrioventricular block, complete: Secondary | ICD-10-CM

## 2012-01-11 LAB — PACEMAKER DEVICE OBSERVATION
AL IMPEDENCE PM: 487.5 Ohm
AL THRESHOLD: 0.75 V
ATRIAL PACING PM: 14
BAMS-0003: 70 {beats}/min
BATTERY VOLTAGE: 3.023 V

## 2012-01-11 NOTE — Progress Notes (Signed)
Pacer check in clinic  

## 2012-03-07 ENCOUNTER — Encounter (HOSPITAL_COMMUNITY): Payer: Self-pay | Admitting: Pharmacy Technician

## 2012-03-10 ENCOUNTER — Other Ambulatory Visit: Payer: Self-pay

## 2012-03-12 ENCOUNTER — Encounter: Payer: Medicare Other | Admitting: Internal Medicine

## 2012-03-16 MED ORDER — SODIUM CHLORIDE 0.9 % IJ SOLN: 3.0000 mL | INTRAMUSCULAR | Status: DC | PRN

## 2012-03-17 ENCOUNTER — Other Ambulatory Visit: Payer: Self-pay

## 2012-03-17 ENCOUNTER — Encounter (HOSPITAL_COMMUNITY)
Admission: RE | Disposition: A | Discharge status: Home / Self Care | Payer: Self-pay | Source: Ambulatory Visit | Attending: Surgery

## 2012-03-17 ENCOUNTER — Ambulatory Visit (HOSPITAL_COMMUNITY)
Admission: RE | Admit: 2012-03-17 | Discharge: 2012-03-17 | Disposition: A | Discharge status: Home / Self Care | Payer: Medicare Other | Source: Ambulatory Visit | Attending: Surgery | Admitting: Surgery

## 2012-03-17 DIAGNOSIS — T82898A Other specified complication of vascular prosthetic devices, implants and grafts, initial encounter: Secondary | ICD-10-CM

## 2012-03-17 DIAGNOSIS — E119 Type 2 diabetes mellitus without complications: Secondary | ICD-10-CM | POA: Insufficient documentation

## 2012-03-17 DIAGNOSIS — I871 Compression of vein: Secondary | ICD-10-CM | POA: Insufficient documentation

## 2012-03-17 DIAGNOSIS — Y832 Surgical operation with anastomosis, bypass or graft as the cause of abnormal reaction of the patient, or of later complication, without mention of misadventure at the time of the procedure: Secondary | ICD-10-CM | POA: Insufficient documentation

## 2012-03-17 DIAGNOSIS — I868 Varicose veins of other specified sites: Secondary | ICD-10-CM | POA: Insufficient documentation

## 2012-03-17 DIAGNOSIS — N186 End stage renal disease: Secondary | ICD-10-CM | POA: Insufficient documentation

## 2012-03-17 DIAGNOSIS — Z992 Dependence on renal dialysis: Secondary | ICD-10-CM | POA: Insufficient documentation

## 2012-03-17 DIAGNOSIS — I120 Hypertensive chronic kidney disease with stage 5 chronic kidney disease or end stage renal disease: Secondary | ICD-10-CM | POA: Insufficient documentation

## 2012-03-17 HISTORY — PX: SHUNTOGRAM: SHX5491

## 2012-03-17 LAB — POCT I-STAT, CHEM 8
Calcium, Ion: 1.15 mmol/L (ref 1.13–1.30)
Chloride: 96 mEq/L (ref 96–112)
HCT: 36 % (ref 36.0–46.0)
Hemoglobin: 12.2 g/dL (ref 12.0–15.0)
Potassium: 4.5 mEq/L (ref 3.5–5.1)

## 2012-03-17 LAB — GLUCOSE, CAPILLARY: Glucose-Capillary: 111 mg/dL — ABNORMAL HIGH (ref 70–99)

## 2012-03-17 SURGERY — ASSESSMENT, SHUNT FUNCTION, WITH CONTRAST RADIOGRAPHIC STUDY
Anesthesia: LOCAL | Laterality: Left

## 2012-03-17 MED ORDER — HEPARIN (PORCINE) IN NACL 2-0.9 UNIT/ML-% IJ SOLN
INTRAMUSCULAR | Status: AC
  Filled 2012-03-17: qty 500

## 2012-03-17 MED ORDER — LIDOCAINE HCL (PF) 1 % IJ SOLN
INTRAMUSCULAR | Status: AC
  Filled 2012-03-17: qty 30

## 2012-03-17 NOTE — Op Note (Signed)
PATIENT: Melissa [name]   MRN: [id number] DOB: Jun 01, 1940    DATE OF PROCEDURE: 03/17/2012  INDICATIONS: Melissa [name] is a 72 y.o. female Who was having problems with flow in her left forearm AV graft. She was set up for a fistulogram.  PROCEDURE:  1. Ultrasound-guided access to left forearm AV graft 2. fistulogram left forearm AV graft  SURGEON: Di Kindle. Edilia Bo, MD, FACS  ANESTHESIA: local   EBL: minimal  TECHNIQUE: The patient was brought to the peripheral vascular lab and the left arm was prepped and draped in the usual sterile fashion. After the skin was anesthetized with 1% lidocaine, and under ultrasound guidance, the left AV graft was cannulated. The needle was directed towards the venous anastomosis. A fistulogram was then obtained and the veins were studied from the cannulation site to the central veins in the chest. Next a blood pressure cuff was inflated to supra-systolic pressure in contrast injected to allow reflux into the arterial limb of the graft and the arterial anastomosis.  FINDINGS:  1. 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 Given the large aneurysm I did not think this was amenable to venoplasty. 2. There was moderate disease in the venous limb of the graft near the distal loop. 3. The outflow basilic vein was widely patent with no areas of central venous stenosis or outflow stenosis otherwise. 4. There was mild disease within the arterial limb of the graft. The arterial anastomosis was widely patent.  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 Given these fairly significant stenosis in the venous limb of the graft I will replace the venous half of the graft. This was scheduled for a nondialysis days possibly to 03/21/12.  Waverly Ferrari, MD, FACS [facility]  [facility]  DATE OF DICTATION:   03/17/2012

## 2012-03-17 NOTE — H&P (Signed)
[facility] of [facility]  Patient name: Melissa [name] MRN: [id number] DOB: February 25, 1940 Sex: female  REASON FOR CONSULT: Poor flow in left AVG  HPI: Melissa [name] is a 72 y.o. female who dialyzes on TTS, who has had problems with flwo in her left forearm AVG. She deniesa ny recent uremic symptoms. She was set up for a fistulogram.   Past Medical History  Diagnosis Date  . Coronary artery disease     catheterization March 2011.(presentation wtih mild CHF and mild increased troponin))..70% proximal/80% mid LAD,50% circumflex.Marland KitchenLAD could be approached but with difficulty....medicall therapy recommended/NSTEMI 01/31/2010 with sepsis and anemia...EF 50%...medical Rx,  . Diabetes mellitus   . Hyperlipidemia   . Aortic stenosis     mild to moderate...echo.Marland KitchenMarland KitchenSeptember,2006/catheterization valve area 1.9cm square/mod/severe....echo...10/2009/mod/severe....echo....01/31/2010,  . 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 Hypertension   . Anemia   . CVA (cerebral vascular accident)   . Chronic kidney disease     ESRD...dialysis  . Carotid artery disease     doppler 06-27-2009 /  doppler 05/08/2010 similar, <50% bilateral with some plaque  . Edema   . Ejection fraction     EF 50%, cardiac catheterization, January, 2012  . Myocardial infarction   . Anginal pain   . Dysrhythmia   . Heart murmur   . Arthritis   . Complete heart block     St. Jude Medical Accent DR RF dual-chamber PPM (model PM [id number]; serial # [id number])    Family History  Problem Relation Age of Onset  . Coronary artery disease Mother     died age 64  . Diabetes type II Mother   . Other Father     died in 2022-06-28 of accident  . Diabetes type II Daughter   . Hiatal hernia Son     SOCIAL HISTORY: History  Substance Use Topics  . Smoking status: Never Smoker   . Smokeless tobacco: Never Used  . Alcohol Use: No    Allergies  Allergen Reactions  . Codeine Nausea And Vomiting     Current Facility-Administered Medications  Medication Dose Route Frequency Provider Last Rate Last Dose  . sodium chloride 0.9 % injection 3 mL  3 mL Intravenous PRN Nada Libman, MD        REVIEW OF SYSTEMS: Arly.Keller ] denotes positive finding; [  ] denotes negative finding CARDIOVASCULAR:  [ ]  chest pain   [ ]  chest pressure   [ ]  palpitations   [ ]  orthopnea   [ ]  dyspnea on exertion   [ ]  claudication   [ ]  rest pain   [ ]  DVT   [ ]  phlebitis PULMONARY:   [ ]  productive cough   [ ]  asthma   [ ]  wheezing NEUROLOGIC:   [ ]  weakness  [ ]  paresthesias  [ ]  aphasia  [ ]  amaurosis  [ ]  dizziness HEMATOLOGIC:   [ ]  bleeding problems   [ ]  clotting disorders MUSCULOSKELETAL:  [ ]  joint pain   [ ]  joint swelling [ ]  leg swelling GASTROINTESTINAL: [ ]   blood in stool  [ ]   hematemesis GENITOURINARY:  [ ]   dysuria  [ ]   hematuria PSYCHIATRIC:  [ ]  history of major depression INTEGUMENTARY:  [ ]  rashes  [ ]  ulcers CONSTITUTIONAL:  [ ]  fever   [ ]  chills  PHYSICAL EXAM: Filed Vitals:   03/17/12 0707  BP: 188/88  Pulse: 78  Temp: 98.1 F (  36.7 C)  TempSrc: Oral  Resp: 20  Height: 5\' 4"  (1.626 m)  Weight: 196 lb (88.905 kg)  SpO2: 98%   Body mass index is 33.63 kg/(m^2). GENERAL: The patient is a well-nourished female, in no acute distress. The vital signs are documented above. CARDIOVASCULAR: There is a regular rate and rhythm.  PULMONARY: There is good air exchange bilaterally without wheezing or rales. ABDOMEN: Soft and non-tender with normal pitched bowel sounds.  MUSCULOSKELETAL: There are no major deformities or cyanosis. NEUROLOGIC: No focal weakness or paresthesias are detected. SKIN: There are no ulcers or rashes noted. PSYCHIATRIC: The patient has a normal affect. Good thrill in Left forearm AVG, slightly pulsatile.   DATA:  Lab Results  Component Value Date   WBC 5.7 12/13/2011   HGB 12.2 03/17/2012   HCT 36.0 03/17/2012   MCV 103.9* 12/13/2011   PLT 83* 12/13/2011    Lab Results  Component Value Date   NA 134* 03/17/2012   K 4.5 03/17/2012   CL 96 03/17/2012   CO2 29 12/13/2011   Lab Results  Component Value Date   CREATININE 4.90* 03/17/2012   Lab Results  Component Value Date   INR 1.11 12/08/2011   INR 1.05 06/16/2011   Lab Results  Component Value Date   HGBA1C 5.4 12/08/2011   CBG (last 3)   Recent Labs  03/17/12 0717  GLUCAP 134*   MEDICAL ISSUES: For fistulogram of Left AVG, possible venoplasty. Procedure and risks discussed with patient.   DICKSON,CHRISTOPHER S [facility]: [phone]

## 2012-03-18 ENCOUNTER — Encounter (HOSPITAL_COMMUNITY): Payer: Self-pay | Admitting: Pharmacy Technician

## 2012-03-19 ENCOUNTER — Encounter: Payer: Medicare Other | Admitting: Internal Medicine

## 2012-03-20 ENCOUNTER — Encounter (HOSPITAL_COMMUNITY): Payer: Self-pay | Admitting: *Deleted

## 2012-03-20 MED ORDER — SODIUM CHLORIDE 0.9 % IV SOLN
INTRAVENOUS | Status: DC
  Administered 2012-03-21: 35 mL/h via INTRAVENOUS

## 2012-03-20 MED ORDER — DEXTROSE 5 % IV SOLN
1.5000 g | INTRAVENOUS | Status: AC
  Administered 2012-03-21: 1.5 g via INTRAVENOUS
  Filled 2012-03-20: qty 1.5

## 2012-03-20 NOTE — Progress Notes (Addendum)
I notified Dr Nicholaus Corolla regarding her hx: DM, ESRD, MI, CAD 70% blockage Proximal, 80% mid LAD, 50 % Circumflex, Pacer, Mod to Severe Aortic Stenosis and informed him of office visit at [facility] in 12/13.  Dr Varney Daily said they will review Echo and studies in AM.

## 2012-03-21 ENCOUNTER — Ambulatory Visit (HOSPITAL_COMMUNITY): Payer: Medicare Other | Admitting: Anesthesiology

## 2012-03-21 ENCOUNTER — Ambulatory Visit (HOSPITAL_COMMUNITY)
Admission: RE | Admit: 2012-03-21 | Discharge: 2012-03-21 | Disposition: A | Discharge status: Home / Self Care | Payer: Medicare Other | Source: Ambulatory Visit | Attending: Vascular Surgery | Admitting: Vascular Surgery

## 2012-03-21 ENCOUNTER — Encounter (HOSPITAL_COMMUNITY)
Admission: RE | Disposition: A | Discharge status: Home / Self Care | Payer: Self-pay | Source: Ambulatory Visit | Attending: Vascular Surgery

## 2012-03-21 ENCOUNTER — Encounter (HOSPITAL_COMMUNITY): Payer: Self-pay | Admitting: *Deleted

## 2012-03-21 ENCOUNTER — Encounter (HOSPITAL_COMMUNITY): Payer: Self-pay | Admitting: Anesthesiology

## 2012-03-21 DIAGNOSIS — I871 Compression of vein: Secondary | ICD-10-CM | POA: Insufficient documentation

## 2012-03-21 DIAGNOSIS — N186 End stage renal disease: Secondary | ICD-10-CM | POA: Insufficient documentation

## 2012-03-21 DIAGNOSIS — Z992 Dependence on renal dialysis: Secondary | ICD-10-CM | POA: Insufficient documentation

## 2012-03-21 DIAGNOSIS — E119 Type 2 diabetes mellitus without complications: Secondary | ICD-10-CM | POA: Insufficient documentation

## 2012-03-21 DIAGNOSIS — Y832 Surgical operation with anastomosis, bypass or graft as the cause of abnormal reaction of the patient, or of later complication, without mention of misadventure at the time of the procedure: Secondary | ICD-10-CM | POA: Insufficient documentation

## 2012-03-21 DIAGNOSIS — T82898A Other specified complication of vascular prosthetic devices, implants and grafts, initial encounter: Secondary | ICD-10-CM

## 2012-03-21 DIAGNOSIS — I721 Aneurysm of artery of upper extremity: Secondary | ICD-10-CM | POA: Insufficient documentation

## 2012-03-21 DIAGNOSIS — I120 Hypertensive chronic kidney disease with stage 5 chronic kidney disease or end stage renal disease: Secondary | ICD-10-CM | POA: Insufficient documentation

## 2012-03-21 DIAGNOSIS — N189 Chronic kidney disease, unspecified: Secondary | ICD-10-CM

## 2012-03-21 HISTORY — PX: REVISION OF ARTERIOVENOUS GORETEX GRAFT: SHX6073

## 2012-03-21 LAB — POCT I-STAT 4, (NA,K, GLUC, HGB,HCT): Sodium: 131 mEq/L — ABNORMAL LOW (ref 135–145)

## 2012-03-21 LAB — SURGICAL PCR SCREEN: Staphylococcus aureus: NEGATIVE

## 2012-03-21 SURGERY — REVISION OF ARTERIOVENOUS GORETEX GRAFT
Anesthesia: Monitor Anesthesia Care | Site: Arm Lower | Laterality: Left | Wound class: Clean

## 2012-03-21 MED ORDER — 0.9 % SODIUM CHLORIDE (POUR BTL) OPTIME
TOPICAL | Status: DC | PRN
  Administered 2012-03-21: 1000 mL

## 2012-03-21 MED ORDER — PROPOFOL INFUSION 10 MG/ML OPTIME
INTRAVENOUS | Status: DC | PRN
  Administered 2012-03-21: 75 ug/kg/min via INTRAVENOUS

## 2012-03-21 MED ORDER — MIDAZOLAM HCL 2 MG/2ML IJ SOLN
1.0000 mg | INTRAMUSCULAR | Status: DC | PRN
  Administered 2012-03-21 (×2): 1 mg via INTRAVENOUS

## 2012-03-21 MED ORDER — MUPIROCIN 2 % EX OINT
TOPICAL_OINTMENT | Freq: Two times a day (BID) | CUTANEOUS | Status: DC
  Administered 2012-03-21: 1 via NASAL
  Filled 2012-03-21 (×2): qty 22

## 2012-03-21 MED ORDER — THROMBIN 20000 UNITS EX SOLR
CUTANEOUS | Status: AC
  Filled 2012-03-21: qty 20000

## 2012-03-21 MED ORDER — HEPARIN SODIUM (PORCINE) 1000 UNIT/ML IJ SOLN
INTRAMUSCULAR | Status: DC | PRN
  Administered 2012-03-21: 7000 [IU] via INTRAVENOUS

## 2012-03-21 MED ORDER — LIDOCAINE-EPINEPHRINE (PF) 1 %-1:200000 IJ SOLN
INTRAMUSCULAR | Status: DC | PRN
  Administered 2012-03-21: 30 mL

## 2012-03-21 MED ORDER — OXYCODONE-ACETAMINOPHEN 5-325 MG PO TABS: 1.0000 | ORAL_TABLET | ORAL | Status: DC | PRN

## 2012-03-21 MED ORDER — LIDOCAINE-EPINEPHRINE (PF) 1 %-1:200000 IJ SOLN
INTRAMUSCULAR | Status: AC
  Filled 2012-03-21: qty 10

## 2012-03-21 MED ORDER — FENTANYL CITRATE 0.05 MG/ML IJ SOLN: 25.0000 ug | INTRAMUSCULAR | Status: DC | PRN

## 2012-03-21 MED ORDER — PROTAMINE SULFATE 10 MG/ML IV SOLN
INTRAVENOUS | Status: DC | PRN
  Administered 2012-03-21: 40 mg via INTRAVENOUS

## 2012-03-21 MED ORDER — SODIUM CHLORIDE 0.9 % IV SOLN: INTRAVENOUS | Status: DC

## 2012-03-21 MED ORDER — LIDOCAINE HCL (CARDIAC) 20 MG/ML IV SOLN
INTRAVENOUS | Status: DC | PRN
  Administered 2012-03-21: 30 mg via INTRAVENOUS

## 2012-03-21 MED ORDER — FENTANYL CITRATE 0.05 MG/ML IJ SOLN
50.0000 ug | Freq: Once | INTRAMUSCULAR | Status: AC
  Administered 2012-03-21: 25 ug via INTRAVENOUS
  Administered 2012-03-21: 50 ug via INTRAVENOUS

## 2012-03-21 MED ORDER — EPHEDRINE SULFATE 50 MG/ML IJ SOLN
INTRAMUSCULAR | Status: DC | PRN
  Administered 2012-03-21 (×2): 10 mg via INTRAVENOUS
  Administered 2012-03-21: 15 mg via INTRAVENOUS
  Administered 2012-03-21: 10 mg via INTRAVENOUS

## 2012-03-21 MED ORDER — LIDOCAINE HCL (PF) 1 % IJ SOLN
INTRAMUSCULAR | Status: AC
  Filled 2012-03-21: qty 30

## 2012-03-21 MED ORDER — SODIUM CHLORIDE 0.9 % IR SOLN
Status: DC | PRN
  Administered 2012-03-21: 10:00:00

## 2012-03-21 SURGICAL SUPPLY — 42 items
ADH SKN CLS APL DERMABOND .7 (GAUZE/BANDAGES/DRESSINGS) ×1
CANISTER SUCTION 2500CC (MISCELLANEOUS) ×2 IMPLANT
CLIP TI MEDIUM 6 (CLIP) ×2 IMPLANT
CLIP TI WIDE RED SMALL 6 (CLIP) ×2 IMPLANT
CLOTH BEACON ORANGE TIMEOUT ST (SAFETY) ×2 IMPLANT
COVER SURGICAL LIGHT HANDLE (MISCELLANEOUS) ×2 IMPLANT
DECANTER SPIKE VIAL GLASS SM (MISCELLANEOUS) ×2 IMPLANT
DERMABOND ADVANCED (GAUZE/BANDAGES/DRESSINGS) ×1
DERMABOND ADVANCED .7 DNX12 (GAUZE/BANDAGES/DRESSINGS) ×1 IMPLANT
ELECT REM PT RETURN 9FT ADLT (ELECTROSURGICAL) ×2
ELECTRODE REM PT RTRN 9FT ADLT (ELECTROSURGICAL) ×1 IMPLANT
GLOVE BIO SURGEON STRL SZ7.5 (GLOVE) ×2 IMPLANT
GLOVE BIOGEL PI IND STRL 6.5 (GLOVE) IMPLANT
GLOVE BIOGEL PI IND STRL 7.0 (GLOVE) IMPLANT
GLOVE BIOGEL PI IND STRL 7.5 (GLOVE) IMPLANT
GLOVE BIOGEL PI IND STRL 8 (GLOVE) ×1 IMPLANT
GLOVE BIOGEL PI INDICATOR 6.5 (GLOVE) ×1
GLOVE BIOGEL PI INDICATOR 7.0 (GLOVE) ×1
GLOVE BIOGEL PI INDICATOR 7.5 (GLOVE) ×2
GLOVE BIOGEL PI INDICATOR 8 (GLOVE) ×1
GLOVE ECLIPSE 6.5 STRL STRAW (GLOVE) ×1 IMPLANT
GLOVE SS BIOGEL STRL SZ 7 (GLOVE) IMPLANT
GLOVE SUPERSENSE BIOGEL SZ 7 (GLOVE) ×1
GLOVE SURG SS PI 8.0 STRL IVOR (GLOVE) ×1 IMPLANT
GOWN PREVENTION PLUS XXLARGE (GOWN DISPOSABLE) ×1 IMPLANT
GOWN STRL NON-REIN LRG LVL3 (GOWN DISPOSABLE) ×5 IMPLANT
GRAFT GORETEX STRT 4-7X45 (Vascular Products) ×1 IMPLANT
KIT BASIN OR (CUSTOM PROCEDURE TRAY) ×2 IMPLANT
KIT ROOM TURNOVER OR (KITS) ×2 IMPLANT
NS IRRIG 1000ML POUR BTL (IV SOLUTION) ×2 IMPLANT
PACK CV ACCESS (CUSTOM PROCEDURE TRAY) ×2 IMPLANT
PAD ARMBOARD 7.5X6 YLW CONV (MISCELLANEOUS) ×4 IMPLANT
SPONGE SURGIFOAM ABS GEL 100 (HEMOSTASIS) IMPLANT
SUCTION FRAZIER TIP 10 FR DISP (SUCTIONS) ×1 IMPLANT
SUT PROLENE 6 0 BV (SUTURE) ×6 IMPLANT
SUT VIC AB 3-0 SH 27 (SUTURE) ×4
SUT VIC AB 3-0 SH 27X BRD (SUTURE) ×2 IMPLANT
SUT VICRYL 4-0 PS2 18IN ABS (SUTURE) ×4 IMPLANT
TOWEL OR 17X24 6PK STRL BLUE (TOWEL DISPOSABLE) ×2 IMPLANT
TOWEL OR 17X26 10 PK STRL BLUE (TOWEL DISPOSABLE) ×2 IMPLANT
UNDERPAD 30X30 INCONTINENT (UNDERPADS AND DIAPERS) ×2 IMPLANT
WATER STERILE IRR 1000ML POUR (IV SOLUTION) ×2 IMPLANT

## 2012-03-21 NOTE — Progress Notes (Addendum)
Cardiologist :Dr. Myrtis Ser Electrophysiologist:Dr. [name] Primary: Dr. Almond Lint at [facility] Dialysis center: [facility] in [place]. (tues/thurs/sat.)  Pacemaker- St. Jude  Last used nitroglycerine was 3 weeks. Ago. Stated she had pressure in the chest and 1 tab. Relieved it.  Notified Dr. Gypsy Balsam of pt's chest pressure 3 weeks ago. Reviewed prior cxr from 12/10/11. No need to repeat cxr today.

## 2012-03-21 NOTE — Preoperative (Signed)
Beta Blockers   Reason not to administer Beta Blockers:Not Applicable 

## 2012-03-21 NOTE — Transfer of Care (Signed)
Immediate Anesthesia Transfer of Care Note  Patient: Melissa [name]  Procedure(s) Performed: Procedure(s) with comments: REVISION OF ARTERIOVENOUS GORETEX GRAFT (Left) - Revision of left arm arteriovenous gortex graft; replace venous half of graft  Patient Location: PACU  Anesthesia Type:MAC  Level of Consciousness: awake, alert  and oriented  Airway & Oxygen Therapy: Patient Spontanous Breathing  Post-op Assessment: Report given to PACU RN and Post -op Vital signs reviewed and stable  Post vital signs: Reviewed and stable  Complications: No apparent anesthesia complications

## 2012-03-21 NOTE — Anesthesia Preprocedure Evaluation (Addendum)
Anesthesia Evaluation  Patient identified by MRN, date of birth, ID band Patient awake    Reviewed: Allergy & Precautions, H&P , NPO status , Patient's Chart, lab work & pertinent test results  Airway Mallampati: II TM Distance: >3 FB     Dental   Pulmonary  breath sounds clear to auscultation        Cardiovascular hypertension, Pt. on medications + angina + CAD, + Past MI and + Peripheral Vascular Disease + dysrhythmias + pacemaker + Valvular Problems/Murmurs AS Rhythm:Regular  CHB-pacemaker   Neuro/Psych CVA    GI/Hepatic   Endo/Other  diabetes  Renal/GU ESRF and DialysisRenal disease     Musculoskeletal   Abdominal (+) + obese,   Peds  Hematology   Anesthesia Other Findings   Reproductive/Obstetrics                          Anesthesia Physical Anesthesia Plan  ASA: III  Anesthesia Plan: MAC   Post-op Pain Management:    Induction: Intravenous  Airway Management Planned: Simple Face Mask  Additional Equipment:   Intra-op Plan:   Post-operative Plan:   Informed Consent: I have reviewed the patients History and Physical, chart, labs and discussed the procedure including the risks, benefits and alternatives for the proposed anesthesia with the patient or authorized representative who has indicated his/her understanding and acceptance.     Plan Discussed with: CRNA and Surgeon  Anesthesia Plan Comments:         Anesthesia Quick Evaluation

## 2012-03-21 NOTE — H&P (View-Only) (Signed)
[facility]  Patient name: Melissa [name] MRN: [id number] DOB: 09/07/1940 Sex: female  REASON FOR CONSULT: Poor flow in left AVG  HPI: Melissa [name] is a 71 y.o. female who dialyzes on TTS, who has had problems with flwo in her left forearm AVG. She deniesa ny recent uremic symptoms. She was set up for a fistulogram.   Past Medical History  Diagnosis Date  . Coronary artery disease     catheterization March 2011.(presentation wtih mild CHF and mild increased troponin))..70% proximal/80% mid LAD,50% circumflex..LAD could be approached but with difficulty....medicall therapy recommended/NSTEMI 01/31/2010 with sepsis and anemia...EF 50%...medical Rx,  . Diabetes mellitus   . Hyperlipidemia   . Aortic stenosis     mild to moderate...echo...September,2006/catheterization valve area 1.9cm square/mod/severe....echo...10/2009/mod/severe....echo....01/31/2010,  . 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 Hypertension   . Anemia   . CVA (cerebral vascular accident)   . Chronic kidney disease     ESRD...dialysis  . Carotid artery disease     doppler 2011 /  doppler 05/08/2010 similar, <50% bilateral with some plaque  . Edema   . Ejection fraction     EF 50%, cardiac catheterization, January, 2012  . Myocardial infarction   . Anginal pain   . Dysrhythmia   . Heart murmur   . Arthritis   . Complete heart block     St. Jude Medical Accent DR RF dual-chamber PPM (model PM 2210; serial # [id number])    Family History  Problem Relation Age of Onset  . Coronary artery disease Mother     died age 56  . Diabetes type II Mother   . Other Father     died in 20's of accident  . Diabetes type II Daughter   . Hiatal hernia Son     SOCIAL HISTORY: History  Substance Use Topics  . Smoking status: Never Smoker   . Smokeless tobacco: Never Used  . Alcohol Use: No    Allergies  Allergen Reactions  . Codeine Nausea And Vomiting     Current Facility-Administered Medications  Medication Dose Route Frequency Provider Last Rate Last Dose  . sodium chloride 0.9 % injection 3 mL  3 mL Intravenous PRN Vance W Brabham, MD        REVIEW OF SYSTEMS: [X ] denotes positive finding; [  ] denotes negative finding CARDIOVASCULAR:  [ ] chest pain   [ ] chest pressure   [ ] palpitations   [ ] orthopnea   [ ] dyspnea on exertion   [ ] claudication   [ ] rest pain   [ ] DVT   [ ] phlebitis PULMONARY:   [ ] productive cough   [ ] asthma   [ ] wheezing NEUROLOGIC:   [ ] weakness  [ ] paresthesias  [ ] aphasia  [ ] amaurosis  [ ] dizziness HEMATOLOGIC:   [ ] bleeding problems   [ ] clotting disorders MUSCULOSKELETAL:  [ ] joint pain   [ ] joint swelling [ ] leg swelling GASTROINTESTINAL: [ ]  blood in stool  [ ]  hematemesis GENITOURINARY:  [ ]  dysuria  [ ]  hematuria PSYCHIATRIC:  [ ] history of major depression INTEGUMENTARY:  [ ] rashes  [ ] ulcers CONSTITUTIONAL:  [ ] fever   [ ] chills  PHYSICAL EXAM: Filed Vitals:   03/17/12 0707  BP: 188/88  Pulse: 78  Temp: 98.1 F (  36.7 C)  TempSrc: Oral  Resp: 20  Height: 5' 4" (1.626 m)  Weight: 196 lb (88.905 kg)  SpO2: 98%   Body mass index is 33.63 kg/(m^2). GENERAL: The patient is a well-nourished female, in no acute distress. The vital signs are documented above. CARDIOVASCULAR: There is a regular rate and rhythm.  PULMONARY: There is good air exchange bilaterally without wheezing or rales. ABDOMEN: Soft and non-tender with normal pitched bowel sounds.  MUSCULOSKELETAL: There are no major deformities or cyanosis. NEUROLOGIC: No focal weakness or paresthesias are detected. SKIN: There are no ulcers or rashes noted. PSYCHIATRIC: The patient has a normal affect. Good thrill in Left forearm AVG, slightly pulsatile.   DATA:  Lab Results  Component Value Date   WBC 5.7 12/13/2011   HGB 12.2 03/17/2012   HCT 36.0 03/17/2012   MCV 103.9* 12/13/2011   PLT 83* 12/13/2011    Lab Results  Component Value Date   NA 134* 03/17/2012   K 4.5 03/17/2012   CL 96 03/17/2012   CO2 29 12/13/2011   Lab Results  Component Value Date   CREATININE 4.90* 03/17/2012   Lab Results  Component Value Date   INR 1.11 12/08/2011   INR 1.05 06/16/2011   Lab Results  Component Value Date   HGBA1C 5.4 12/08/2011   CBG (last 3)   Recent Labs  03/17/12 0717  GLUCAP 134*   MEDICAL ISSUES: For fistulogram of Left AVG, possible venoplasty. Procedure and risks discussed with patient.   Adah Stoneberg S [facility]: [phone]   

## 2012-03-21 NOTE — Anesthesia Postprocedure Evaluation (Signed)
  Anesthesia Post-op Note  Patient: Melissa [name]  Procedure(s) Performed: Procedure(s) with comments: REVISION OF ARTERIOVENOUS GORETEX GRAFT (Left) - Revision of left arm arteriovenous gortex graft; replace venous half of graft  Patient Location: PACU  Anesthesia Type:MAC  Level of Consciousness: awake  Airway and Oxygen Therapy: Patient Spontanous Breathing  Post-op Pain: mild  Post-op Assessment: Post-op Vital signs reviewed, Patient's Cardiovascular Status Stable, Respiratory Function Stable, Patent Airway, No signs of Nausea or vomiting and Pain level controlled  Post-op Vital Signs: stable  Complications: No apparent anesthesia complications

## 2012-03-21 NOTE — Anesthesia Procedure Notes (Signed)
Procedure Name: MAC Date/Time: 03/21/2012 9:55 AM Performed by: Fransisca Kaufmann Pre-anesthesia Checklist: Patient identified, Emergency Drugs available, Suction available, Patient being monitored and Timeout performed Patient Re-evaluated:Patient Re-evaluated prior to inductionOxygen Delivery Method: Simple face mask Intubation Type: IV induction

## 2012-03-21 NOTE — Interval H&P Note (Signed)
History and Physical Interval Note:  03/21/2012 9:26 AM  Melissa [name]  has presented today for surgery, with the diagnosis of End Stage Renal Disease  The various methods of treatment have been discussed with the patient and family. After consideration of risks, benefits and other options for treatment, the patient has consented to  Procedure(s) with comments: REVISION OF ARTERIOVENOUS GORETEX GRAFT (Left) - Revision of left arm arteriovenous gortex graft; replace venous half of graft as a surgical intervention .  The patient's history has been reviewed, patient examined, no change in status, stable for surgery.  I have reviewed the patient's chart and labs.  Questions were answered to the patient's satisfaction.     Kalene Cutler S

## 2012-03-21 NOTE — Op Note (Signed)
NAME: Melissa [name]    MRN: [id number] DOB: 09/24/1940    DATE OF OPERATION: 03/21/2012  PREOP DIAGNOSIS: chronic kidney disease  POSTOP DIAGNOSIS: same  PROCEDURE: revision of left forearm AV graft (replaced and the venous half of the graft along the ulnar aspect of the forearm)  SURGEON: Di Kindle. Edilia Bo, MD, FACS  ASSIST: Lianne Cure PA  ANESTHESIA: local with sedation   EBL: minimal  INDICATIONS: Melissa [name] is a 72 y.o. female who is having problems with flow in her left forearm AV graft. 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 In addition there was narrowing along the venous limb of the graft near the distal forearm. I felt that the best option was to replace the venous half of the graft.  TECHNIQUE: The patient was brought to the operating room and received sedation. The left upper extremity was prepped and draped in the usual sterile fashion. After the skin was infiltrated with 1% lidocaine, an incision was made over the venous distal loop of the graft with the graft was dissected free. A separate incision was made over the aneurysm extending onto the sewing vein in the upper arm. The vein was dissected free and controlled with a vessel loop. A tunnel was created between the 2 incisions and a 7 mm PTFE graft was tunneled between the 2 incisions. Patient was heparinized. At the proximal end, the proximal graft was spatulated after it was clamped and the new segment of graft was spatulated and sewn end-to-end to the old graft using continuous 6-0 Prolene suture. The venous end the vein was ligated and spatulated proximally. The graft was cut to the appropriate length and sewn into and to the vein using continuous 6-0 Prolene suture. At the completion was a good thrill in the graft. Hemostasis was obtained in the wounds. The wounds were closed with a deep layer of 3-0 Vicryl the skin closed with 4-0 Vicryl. Dermabond was  applied. The patient tolerated the procedure well and was transferred to the recovery room in stable condition.   Waverly Ferrari, MD, FACS [facility]  DATE OF DICTATION:   03/21/2012

## 2012-03-24 ENCOUNTER — Encounter (HOSPITAL_COMMUNITY): Payer: Self-pay | Admitting: Vascular Surgery

## 2012-03-24 ENCOUNTER — Other Ambulatory Visit: Payer: Self-pay | Admitting: *Deleted

## 2012-03-24 MED ORDER — NITROGLYCERIN 0.4 MG SL SUBL: 0.4000 mg | SUBLINGUAL_TABLET | SUBLINGUAL | Status: DC | PRN

## 2012-04-22 ENCOUNTER — Other Ambulatory Visit: Payer: Self-pay | Admitting: *Deleted

## 2012-04-22 DIAGNOSIS — I350 Nonrheumatic aortic (valve) stenosis: Secondary | ICD-10-CM

## 2012-05-09 ENCOUNTER — Encounter: Payer: Medicare Other | Admitting: Internal Medicine

## 2012-05-12 ENCOUNTER — Ambulatory Visit (INDEPENDENT_AMBULATORY_CARE_PROVIDER_SITE_OTHER): Payer: Medicare Other | Admitting: Internal Medicine

## 2012-05-12 ENCOUNTER — Encounter: Payer: Self-pay | Admitting: Internal Medicine

## 2012-05-12 VITALS — BP 104/65 | HR 87 | Ht 63.0 in | Wt 198.0 lb

## 2012-05-12 DIAGNOSIS — I2510 Atherosclerotic heart disease of native coronary artery without angina pectoris: Secondary | ICD-10-CM

## 2012-05-12 DIAGNOSIS — I442 Atrioventricular block, complete: Secondary | ICD-10-CM

## 2012-05-12 DIAGNOSIS — I10 Essential (primary) hypertension: Secondary | ICD-10-CM

## 2012-05-12 NOTE — Progress Notes (Signed)
PCP: Donzetta Sprung, MD Primary Cardiologist:  Melissa [name] is a 72 y.o. female who presents today for routine electrophysiology followup.  Since having her pacemaker implanted, the patient reports doing very well.  Today, she denies symptoms of palpitations, chest pain, shortness of breath,  lower extremity edema, dizziness, presyncope, or syncope.  The patient is otherwise without complaint today.   Past Medical History  Diagnosis Date  . Coronary artery disease     catheterization March 2011.(presentation wtih mild CHF and mild increased troponin))..70% proximal/80% mid LAD,50% circumflex.Marland KitchenLAD could be approached but with difficulty....medicall therapy recommended/NSTEMI 01/31/2010 with sepsis and anemia...EF 50%...medical Rx,  . Diabetes mellitus   . Hyperlipidemia   . Aortic stenosis     mild to moderate...echo.Marland KitchenMarland KitchenSeptember,2006/catheterization valve area 1.9cm square/mod/severe....echo...10/2009/mod/severe....echo....01/31/2010,  . 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 Hypertension   . Anemia   . Chronic kidney disease     ESRD...dialysis  . Carotid artery disease     doppler 2011 /  doppler 05/08/2010 similar, <50% bilateral with some plaque  . Edema   . Ejection fraction     EF 50%, cardiac catheterization, January, 2012  . Myocardial infarction   . Anginal pain   . Dysrhythmia   . Heart murmur   . Arthritis   . Second degree Mobitz II AV block     St. Jude Medical Accent Melissa RF dual-chamber PPM (model PM [id number]; serial # [id number])  . CVA (cerebral vascular accident) 2007    right side weakness  . Pacemaker    Past Surgical History  Procedure Laterality Date  . Appendectomy    . Oophorectomy    . Toe amputation      left great toe  . Retinal detachment surgery    . Cataract extraction    . Coronary angioplasty    . Cholecystectomy    . Avf      Left Arm  . Revision of arteriovenous goretex graft Left 03/21/2012   Procedure: REVISION OF ARTERIOVENOUS GORETEX GRAFT;  Surgeon: Chuck Hint, MD;  Location: [facility];  Service: Vascular;  Laterality: Left;  Revision of left arm arteriovenous gortex graft; replace venous half of graft  . Pacemaker insertion  12/10/11    SJM Accent Melissa RF implanted by Melissa Johney Frame for mobitz II AV block    Current Outpatient Prescriptions  Medication Sig Dispense Refill  . amLODipine (NORVASC) 5 MG tablet Take 5 mg by mouth daily.      Marland Kitchen aspirin EC 81 MG tablet Take 81 mg by mouth daily.      . carvedilol (COREG) 3.125 MG tablet Take 3.125 mg by mouth 2 (two) times daily with a meal.      . cinacalcet (SENSIPAR) 30 MG tablet Take 30 mg by mouth daily.       . clopidogrel (PLAVIX) 75 MG tablet Take 75 mg by mouth daily.      . hydrALAZINE (APRESOLINE) 50 MG tablet Take 50 mg by mouth 2 (two) times daily.        . insulin glargine (LANTUS) 100 UNIT/ML injection Inject 5-10 Units into the skin at bedtime. Per sliding scale.  Patient does not take if blood sugar is 100 or below.      . insulin glulisine (APIDRA) 100 UNIT/ML injection Inject 5-15 Units into the skin 3 (three) times daily before meals. Per sliding scale      . meclizine (ANTIVERT) 12.5 MG tablet Take  12.5 mg by mouth 3 (three) times daily as needed.       . Multiple Vitamin (DAILY VITAMIN FORMULA PO) Take 3 mg by mouth daily. Folic acid- vitamin b complex - vitamin c - selenium - zinc 3 mg tablet.      . nitroGLYCERIN (NITROSTAT) 0.4 MG SL tablet Place 1 tablet (0.4 mg total) under the tongue every 5 (five) minutes as needed. For chest pain  25 tablet  3  . olmesartan (BENICAR) 40 MG tablet Take 40 mg by mouth daily.        Marland Kitchen omeprazole (PRILOSEC) 20 MG capsule Take 20 mg by mouth daily.        Marland Kitchen oxyCODONE-acetaminophen (ROXICET) 5-325 MG per tablet Take 1-2 tablets by mouth every 4 (four) hours as needed for pain.  20 tablet  0  . polyethylene glycol powder (GLYCOLAX/MIRALAX) powder Take 68 g by mouth 3 (three)  times a week. Tuesday, Thursday, Sunday      . sevelamer carbonate (RENVELA) 800 MG tablet Take 1,600-3,200 mg by mouth 3 (three) times daily with meals. Takes 4 tablets with each meal and takes 2 tablets with every snack.      . simvastatin (ZOCOR) 40 MG tablet Take 40 mg by mouth every evening.      . sodium polystyrene (KAYEXALATE) 15 GM/60ML suspension Take 15 g by mouth once.      . traMADol (ULTRAM) 50 MG tablet Take 50 mg by mouth every 6 (six) hours as needed for pain.       No current facility-administered medications for this visit.    Physical Exam: Filed Vitals:   05/12/12 1423  BP: 104/65  Pulse: 87  Height: 5\' 3"  (1.6 m)  Weight: 198 lb (89.812 kg)    GEN- The patient is well appearing, alert and oriented x 3 today.   Head- normocephalic, atraumatic Eyes-  Sclera clear, conjunctiva pink Ears- hearing intact Oropharynx- clear Lungs- Clear to ausculation bilaterally, normal work of breathing Chest- R sided pacemaker pocket is well healed Heart- Regular rate and rhythm, 3/6 SEM LUSB GI- soft, NT, ND, + BS Extremities- no clubbing, cyanosis, or edema  Pacemaker interrogation- reviewed in detail today,  See PACEART report  Assessment and Plan:  1. Mobitz II AV block Normal pacemaker function See Pace Art report VIP turned on today to minimize V pacing (Only 22% V pacing with DDD).  2. HTN Stable No change required today  3 CAD Stable No change required today  Return to the device clinic 11/14

## 2012-05-13 LAB — PACEMAKER DEVICE OBSERVATION
AL THRESHOLD: 0.75 V
ATRIAL PACING PM: 10
BAMS-0001: 170 {beats}/min
BAMS-0003: 70 {beats}/min
DEVICE MODEL PM: 7418910
RV LEAD THRESHOLD: 0.75 V

## 2012-06-29 ENCOUNTER — Encounter: Payer: Self-pay | Admitting: Internal Medicine

## 2012-06-29 ENCOUNTER — Encounter: Payer: Self-pay | Admitting: Physician Assistant

## 2012-06-29 DIAGNOSIS — R0602 Shortness of breath: Secondary | ICD-10-CM

## 2012-06-30 DIAGNOSIS — I5033 Acute on chronic diastolic (congestive) heart failure: Secondary | ICD-10-CM

## 2012-06-30 DIAGNOSIS — I442 Atrioventricular block, complete: Secondary | ICD-10-CM

## 2012-06-30 DIAGNOSIS — I2510 Atherosclerotic heart disease of native coronary artery without angina pectoris: Secondary | ICD-10-CM

## 2012-07-01 DIAGNOSIS — I509 Heart failure, unspecified: Secondary | ICD-10-CM

## 2012-07-02 ENCOUNTER — Other Ambulatory Visit: Payer: Medicare Other

## 2012-07-02 ENCOUNTER — Other Ambulatory Visit: Payer: Self-pay | Admitting: *Deleted

## 2012-07-02 DIAGNOSIS — I350 Nonrheumatic aortic (valve) stenosis: Secondary | ICD-10-CM

## 2012-07-14 ENCOUNTER — Encounter: Payer: Self-pay | Admitting: Cardiology

## 2012-07-16 ENCOUNTER — Encounter: Payer: Self-pay | Admitting: Cardiology

## 2012-07-19 ENCOUNTER — Other Ambulatory Visit (HOSPITAL_COMMUNITY): Payer: Self-pay | Admitting: Physician Assistant

## 2012-07-21 ENCOUNTER — Encounter: Payer: Self-pay | Admitting: Cardiology

## 2012-07-21 ENCOUNTER — Ambulatory Visit (INDEPENDENT_AMBULATORY_CARE_PROVIDER_SITE_OTHER): Payer: Medicare Other | Admitting: Cardiology

## 2012-07-21 VITALS — BP 116/66 | HR 63 | Ht 63.0 in | Wt 188.0 lb

## 2012-07-21 DIAGNOSIS — I428 Other cardiomyopathies: Secondary | ICD-10-CM

## 2012-07-21 DIAGNOSIS — Z950 Presence of cardiac pacemaker: Secondary | ICD-10-CM

## 2012-07-21 DIAGNOSIS — I350 Nonrheumatic aortic (valve) stenosis: Secondary | ICD-10-CM

## 2012-07-21 DIAGNOSIS — I359 Nonrheumatic aortic valve disorder, unspecified: Secondary | ICD-10-CM

## 2012-07-21 DIAGNOSIS — Z992 Dependence on renal dialysis: Secondary | ICD-10-CM | POA: Insufficient documentation

## 2012-07-21 DIAGNOSIS — I2510 Atherosclerotic heart disease of native coronary artery without angina pectoris: Secondary | ICD-10-CM

## 2012-07-21 DIAGNOSIS — I429 Cardiomyopathy, unspecified: Secondary | ICD-10-CM

## 2012-07-21 NOTE — Progress Notes (Signed)
Patient ID: Melissa [name], female   DOB: [birth year], 72 y.o.   MRN: [id number]   HPI   The patient is seen post hospitalization to followup CHF, aortic stenosis, coronary artery disease. She also has end-stage renal disease on dialysis. She also has a pacemaker. She says she's feeling well.  Historically she had known coronary disease with good LV function. She had several interventions during 2013. Also careful attention had been paid to her aortic stenosis and it was felt to be moderate previously.  The patient then had complete heart block in December, 2013. She received a permanent pacemaker.  Then in June, 2014 she was admitted to [facility] with congestive heart failure. Her 2-D echo showed an ejection fraction of 25-30%. The last assessment of her LV function was in late 2012. The EF was 50% then. Therefore I do not know exactly when her LV function changed. Her 2-D echo showed a mean aortic valve gradient of 28 mm of mercury. It was felt that she could have severe aortic stenosis with low gradient and left ventricular dysfunction. Dobutamine echo could be considered to assess this further.  She is now here for followup. She's not having chest pain. She is being dialyzed and doing relatively well. The dilemma is how to proceed with her left ventricular dysfunction and her aortic valve disease.    Allergies  Allergen Reactions  . Codeine Nausea And Vomiting    Current Outpatient Prescriptions  Medication Sig Dispense Refill  . aspirin EC 81 MG tablet Take 81 mg by mouth daily.      . carvedilol (COREG) 12.5 MG tablet Take 12.5 mg by mouth 2 (two) times daily with a meal.      . cinacalcet (SENSIPAR) 30 MG tablet Take 30 mg by mouth daily.       . clopidogrel (PLAVIX) 75 MG tablet TAKE (1) TABLET BY MOUTH ONCE DAILY.  30 tablet  6  . doxazosin (CARDURA) 8 MG tablet Take 8 mg by mouth at bedtime.      . hydrALAZINE (APRESOLINE) 50 MG tablet Take 50 mg by mouth 2 (two) times  daily.        Marland Kitchen HYDROcodone-acetaminophen (NORCO/VICODIN) 5-325 MG per tablet Take 1 tablet by mouth 4 (four) times daily.       . insulin glargine (LANTUS) 100 UNIT/ML injection Inject 5-10 Units into the skin at bedtime. Per sliding scale.  Patient does not take if blood sugar is 100 or below.      . insulin glulisine (APIDRA) 100 UNIT/ML injection Inject 5-15 Units into the skin 3 (three) times daily before meals. Per sliding scale      . Multiple Vitamin (DAILY VITAMIN FORMULA PO) Take 3 mg by mouth daily. Folic acid- vitamin b complex - vitamin c - selenium - zinc 3 mg tablet.      . nitroGLYCERIN (NITROSTAT) 0.4 MG SL tablet Place 1 tablet (0.4 mg total) under the tongue every 5 (five) minutes as needed. For chest pain  25 tablet  3  . olmesartan (BENICAR) 40 MG tablet Take 40 mg by mouth daily.        Marland Kitchen omeprazole (PRILOSEC) 20 MG capsule Take 20 mg by mouth daily.        . polyethylene glycol powder (GLYCOLAX/MIRALAX) powder Take 68 g by mouth 3 (three) times a week. Tuesday, Thursday, Sunday      . sevelamer carbonate (RENVELA) 800 MG tablet Take 1,600-3,200 mg by mouth 3 (three) times daily  with meals. Takes 4 tablets with each meal and takes 2 tablets with every snack.      . simvastatin (ZOCOR) 40 MG tablet Take 40 mg by mouth every evening.      . sodium polystyrene (KAYEXALATE) 15 GM/60ML suspension Take 15 g by mouth once.      . traMADol (ULTRAM) 50 MG tablet Take 50 mg by mouth every 6 (six) hours as needed for pain.       No current facility-administered medications for this visit.    History   Social History  . Marital Status: Divorced    Spouse Name: N/A    Number of Children: N/A  . Years of Education: N/A   Occupational History  . Not on file.   Social History Main Topics  . Smoking status: Never Smoker   . Smokeless tobacco: Never Used  . Alcohol Use: No  . Drug Use: No  . Sexually Active: No   Other Topics Concern  . Not on file   Social History Narrative    Lives in [place] alone.    Divorced and has four children.   Has home health aide 5 days/week    Family History  Problem Relation Age of Onset  . Coronary artery disease Mother     died age 31  . Diabetes type II Mother   . Other Father     died in 06/29/22 of accident  . Diabetes type II Daughter   . Hiatal hernia Son     Past Medical History  Diagnosis Date  . Coronary artery disease     catheterization March 2011.(presentation wtih mild CHF and mild increased troponin))..70% proximal/80% mid LAD,50% circumflex.Marland KitchenLAD could be approached but with difficulty....medicall therapy recommended/NSTEMI 01/31/2010 with sepsis and anemia...EF 50%...medical Rx,  . Diabetes mellitus   . Hyperlipidemia   . Aortic stenosis     mild to moderate...echo.Marland KitchenMarland KitchenSeptember,2006/catheterization valve area 1.9cm square/mod/severe....echo...10/2009/mod/severe....echo....01/31/2010,  . 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 Hypertension   . Anemia   . Chronic kidney disease     ESRD...dialysis  . Carotid artery disease     doppler Jun 28, 2009 /  doppler 05/08/2010 similar, <50% bilateral with some plaque  . Edema   . Ejection fraction     EF 50%, cardiac catheterization, January, 2012  . Myocardial infarction   . Anginal pain   . Dysrhythmia   . Heart murmur   . Arthritis   . Second degree Mobitz II AV block     St. Jude Medical Accent DR RF dual-chamber PPM (model PM [id number]; serial # [id number])  . CVA (cerebral vascular accident) 06/28/2005    right side weakness  . Pacemaker     Past Surgical History  Procedure Laterality Date  . Appendectomy    . Oophorectomy    . Toe amputation      left great toe  . Retinal detachment surgery    . Cataract extraction    . Coronary angioplasty    . Cholecystectomy    . Avf      Left Arm  . Revision of arteriovenous goretex graft Left 03/21/2012    Procedure: REVISION OF ARTERIOVENOUS GORETEX GRAFT;  Surgeon: Chuck Hint,  MD;  Location: [facility] OR;  Service: Vascular;  Laterality: Left;  Revision of left arm arteriovenous gortex graft; replace venous half of graft  . Pacemaker insertion  12/10/11    SJM Accent DR RF implanted by Dr Johney Frame for mobitz II  AV block    Patient Active Problem List   Diagnosis Date Noted  . Pacemaker-St.Jude 12/11/2011  . Second degree Mobitz II AV block 12/08/2011  . Thrombocytopenia 06/19/2011  . Ejection fraction   . CHF (congestive heart failure) 03/02/2011  . NSTEMI (non-ST elevated myocardial infarction) 03/02/2011  . Coronary artery disease   . Hyperlipidemia   . Aortic stenosis   . Dysfunction parathyroid   . Hypertension   . CVA (cerebral vascular accident)   . Chronic kidney disease   . Carotid artery disease   . Edema   . 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 Anemia in chronic kidney disease 04/22/2009  . PERSONAL HX TIA & CI W/O RESIDUAL DEFICITS 04/22/2009    ROS    Patient denies fever, chills, headache, sweats, rash, change in vision, change in hearing, chest pain, cough, nausea vomiting, urinary symptoms. All other systems are reviewed and are negative.  PHYSICAL EXAM  Patient is overweight. She is oriented to person time and place. Affect is normal. There is no jugular venous distention. Lungs are clear. Respiratory effort is nonlabored. Cardiac exam revealed a high pitched crescendo decrescendo systolic murmur consistent with severe aortic stenosis. Her abdomen is soft. She has no significant peripheral edema.  Filed Vitals:   07/21/12 1303  BP: 116/66  Pulse: 63  Height: 5\' 3"  (1.6 m)  Weight: 188 lb (85.276 kg)     ASSESSMENT & PLAN

## 2012-07-21 NOTE — Assessment & Plan Note (Signed)
At this time it is hard to know if her aortic stenosis is critical or not. Consideration will have to be given to doing a dobutamine echo but I've chosen not to start with this.

## 2012-07-21 NOTE — Assessment & Plan Note (Signed)
Her volume status is controlled with dialysis at this time.

## 2012-07-21 NOTE — Assessment & Plan Note (Addendum)
She has had multiple coronary interventions. She had a non-STEMI May, 2013. She received a stent at that time to the RCA and PTCA to the LAD. I am leaning towards proceeding with cardiac catheterization to reassess her coronary disease. At that time we could see if hemodynamics and the cath lab can help with the assessment of the aortic stenosis. I will then ask for further input from Dr. Excell Seltzer to see if this patient is a TAVR candidate  or a surgical candidate.  As part of today's evaluation I spent greater than 25 minutes with her total care. More than half of this time was spent with direct contact with her discussing in considering all of her options.

## 2012-07-21 NOTE — Assessment & Plan Note (Signed)
Her ejection fraction has gone from 50% to 25% between the end of 2012 in June, 2014. I do not know if this is from her aortic stenosis or possibly further ischemic injury.

## 2012-07-21 NOTE — Patient Instructions (Addendum)
Your physician recommends that you schedule a follow-up appointment in: 10 weeks. Your physician recommends that you continue on your current medications as directed. Please refer to the Current Medication list given to you today. We will call you about getting you scheduled with Dr. Excell Seltzer.

## 2012-07-22 ENCOUNTER — Encounter: Payer: Self-pay | Admitting: Cardiology

## 2012-07-22 NOTE — Progress Notes (Signed)
   I spoke with Dr. Excell Seltzer. He and I together agree that we should proceed with cardiac catheterization. When we have that information we can then make further decisions about dobutamine echo in the [place] office. I will schedule the cath for him to do and remind him again at a later date. This will be arranged for late July at the request of the patient. I have not yet finalize these plans.  Myrtis Ser

## 2012-07-31 ENCOUNTER — Telehealth: Payer: Self-pay | Admitting: *Deleted

## 2012-07-31 DIAGNOSIS — R0602 Shortness of breath: Secondary | ICD-10-CM

## 2012-07-31 DIAGNOSIS — Z992 Dependence on renal dialysis: Secondary | ICD-10-CM

## 2012-07-31 DIAGNOSIS — Z01810 Encounter for preprocedural cardiovascular examination: Secondary | ICD-10-CM

## 2012-07-31 DIAGNOSIS — I350 Nonrheumatic aortic (valve) stenosis: Secondary | ICD-10-CM

## 2012-07-31 DIAGNOSIS — I2510 Atherosclerotic heart disease of native coronary artery without angina pectoris: Secondary | ICD-10-CM

## 2012-07-31 DIAGNOSIS — I429 Cardiomyopathy, unspecified: Secondary | ICD-10-CM

## 2012-07-31 DIAGNOSIS — I5040 Unspecified combined systolic (congestive) and diastolic (congestive) heart failure: Secondary | ICD-10-CM

## 2012-07-31 NOTE — Telephone Encounter (Signed)
The plan is for the patient to have a cath by Dr. Excell Seltzer. She had told me that she wanted things done later in July. Please schedule a cath for her after July 20th with Dr. Excell Seltzer. Right and left and coronaries. You will have to find a way to coordinate with her dialysis days. Let me know the date. THX

## 2012-07-31 NOTE — Telephone Encounter (Signed)
Confirmed with patient her dialysis days which are Tuesday, Thursday and Saturday.

## 2012-08-07 ENCOUNTER — Telehealth: Payer: Self-pay | Admitting: Cardiology

## 2012-08-07 ENCOUNTER — Encounter: Payer: Self-pay | Admitting: *Deleted

## 2012-08-07 NOTE — Telephone Encounter (Signed)
Right and Left and coronaries scheduled in [facility] on August 29, 2012 @ 9:30 am with Dr. Excell Seltzer dx:CAD. Patient informed and is coming to the office on this coming Monday August 11, 2012 around 11:00 am to get instructions.

## 2012-08-07 NOTE — Telephone Encounter (Signed)
Pt has Medicare and Medicaid.  No precert required.  Please verify with pt she is not changing insurance on August 1.

## 2012-08-07 NOTE — Telephone Encounter (Signed)
Right and Left and coronaries scheduled in [facility] on August 29, 2012 @ 9:30 am with Dr. Excell Seltzer dx:CAD  Checking percert

## 2012-08-22 LAB — PROTIME-INR

## 2012-08-28 ENCOUNTER — Other Ambulatory Visit: Payer: Self-pay | Admitting: Cardiology

## 2012-08-28 NOTE — H&P (Signed)
    Updated history and physical:  I saw the patient in the office on July 21, 2012. There is an extensive note from that day. The next day I spoke with Dr. Cooper and plans were made for catheterization. At that time I completed the history and physical for the catheterization.  On July 25 the patient came in the office to finalize her lab work. At that time she was stable. Her blood pressure was stable. She had no cardiac complaints. She was doing well. We finalize plans for her catheterization.  The patient is stable with no significant changes since the original evaluation on July 21, 2012. Plans are to proceed with catheterization.  Jeff Brenn Gatton, MD 

## 2012-08-29 ENCOUNTER — Encounter (HOSPITAL_BASED_OUTPATIENT_CLINIC_OR_DEPARTMENT_OTHER): Payer: Self-pay

## 2012-08-29 ENCOUNTER — Inpatient Hospital Stay (HOSPITAL_BASED_OUTPATIENT_CLINIC_OR_DEPARTMENT_OTHER)
Admission: RE | Admit: 2012-08-29 | Discharge: 2012-08-29 | Disposition: A | Discharge status: Home / Self Care | Payer: Medicare Other | Source: Ambulatory Visit | Attending: Cardiovascular Disease | Admitting: Cardiovascular Disease

## 2012-08-29 ENCOUNTER — Encounter (HOSPITAL_BASED_OUTPATIENT_CLINIC_OR_DEPARTMENT_OTHER)
Admission: RE | Disposition: A | Discharge status: Home / Self Care | Payer: Self-pay | Source: Ambulatory Visit | Attending: Cardiovascular Disease

## 2012-08-29 ENCOUNTER — Encounter (HOSPITAL_COMMUNITY): Payer: Self-pay | Admitting: Pharmacy Technician

## 2012-08-29 DIAGNOSIS — I2510 Atherosclerotic heart disease of native coronary artery without angina pectoris: Secondary | ICD-10-CM | POA: Insufficient documentation

## 2012-08-29 DIAGNOSIS — I359 Nonrheumatic aortic valve disorder, unspecified: Secondary | ICD-10-CM | POA: Insufficient documentation

## 2012-08-29 DIAGNOSIS — Y831 Surgical operation with implant of artificial internal device as the cause of abnormal reaction of the patient, or of later complication, without mention of misadventure at the time of the procedure: Secondary | ICD-10-CM | POA: Insufficient documentation

## 2012-08-29 DIAGNOSIS — N186 End stage renal disease: Secondary | ICD-10-CM | POA: Insufficient documentation

## 2012-08-29 DIAGNOSIS — I509 Heart failure, unspecified: Secondary | ICD-10-CM | POA: Insufficient documentation

## 2012-08-29 DIAGNOSIS — T82897A Other specified complication of cardiac prosthetic devices, implants and grafts, initial encounter: Secondary | ICD-10-CM | POA: Insufficient documentation

## 2012-08-29 DIAGNOSIS — E669 Obesity, unspecified: Secondary | ICD-10-CM | POA: Insufficient documentation

## 2012-08-29 DIAGNOSIS — Z9861 Coronary angioplasty status: Secondary | ICD-10-CM | POA: Insufficient documentation

## 2012-08-29 LAB — POCT I-STAT 3, ART BLOOD GAS (G3+)
Acid-Base Excess: 2 mmol/L (ref 0.0–2.0)
Bicarbonate: 26.5 mEq/L — ABNORMAL HIGH (ref 20.0–24.0)

## 2012-08-29 LAB — POCT I-STAT 3, VENOUS BLOOD GAS (G3P V)
TCO2: 28 mmol/L (ref 0–100)
pH, Ven: 7.382 — ABNORMAL HIGH (ref 7.250–7.300)

## 2012-08-29 LAB — POCT I-STAT GLUCOSE: Operator id: 221371

## 2012-08-29 SURGERY — JV LEFT AND RIGHT HEART CATHETERIZATION WITH CORONARY ANGIOGRAM
Anesthesia: Moderate Sedation

## 2012-08-29 MED ORDER — SODIUM CHLORIDE 0.9 % IJ SOLN: 3.0000 mL | INTRAMUSCULAR | Status: DC | PRN

## 2012-08-29 MED ORDER — SODIUM CHLORIDE 0.9 % IJ SOLN: 3.0000 mL | Freq: Two times a day (BID) | INTRAMUSCULAR | Status: DC

## 2012-08-29 MED ORDER — ACETAMINOPHEN 325 MG PO TABS: 650.0000 mg | ORAL_TABLET | ORAL | Status: DC | PRN

## 2012-08-29 MED ORDER — ONDANSETRON HCL 4 MG/2ML IJ SOLN: 4.0000 mg | Freq: Four times a day (QID) | INTRAMUSCULAR | Status: DC | PRN

## 2012-08-29 MED ORDER — SODIUM CHLORIDE 0.9 % IV SOLN: 250.0000 mL | INTRAVENOUS | Status: DC | PRN

## 2012-08-29 NOTE — OR Nursing (Signed)
Tegaderm dressing applied, site level 0, bedrest began at 1020 

## 2012-08-29 NOTE — OR Nursing (Signed)
Dr Cooper at bedside to discuss results and treatment plan with pt and family 

## 2012-08-29 NOTE — OR Nursing (Signed)
Discharge instructions reviewed and signed, pt stated understanding, ambulated in hall with assistance, site level 0, transported to son's car via wheelchair

## 2012-08-29 NOTE — Interval H&P Note (Signed)
History and Physical Interval Note:  08/29/2012 9:25 AM  Melissa [name]  has presented today for surgery, with the diagnosis of SOB and CAD  The various methods of treatment have been discussed with the patient and family. After consideration of risks, benefits and other options for treatment, the patient has consented to  Procedure(s): [facility] LEFT AND RIGHT HEART CATHETERIZATION WITH CORONARY ANGIOGRAM (N/A) as a surgical intervention .  The patient's history has been reviewed, patient examined, no change in status, stable for surgery.  I have reviewed the patient's chart and labs.  Questions were answered to the patient's satisfaction.    Cath Lab Visit (complete for each Cath Lab visit)  Clinical Evaluation Leading to the Procedure:   ACS: no  Non-ACS:    Anginal Classification: CCS III  Anti-ischemic medical therapy: No Therapy  Non-Invasive Test Results: No non-invasive testing performed  Prior CABG: No previous CABG         [name]

## 2012-08-29 NOTE — H&P (View-Only) (Signed)
    Updated history and physical:  I saw the patient in the office on July 21, 2012. There is an extensive note from that day. The next day I spoke with Dr. Excell Seltzer and plans were made for catheterization. At that time I completed the history and physical for the catheterization.  On July 25 the patient came in the office to finalize her lab work. At that time she was stable. Her blood pressure was stable. She had no cardiac complaints. She was doing well. We finalize plans for her catheterization.  The patient is stable with no significant changes since the original evaluation on July 21, 2012. Plans are to proceed with catheterization.  Jerral Bonito, MD

## 2012-08-29 NOTE — OR Nursing (Signed)
AV Graft left forearm, +bruit/thrill

## 2012-08-29 NOTE — CV Procedure (Signed)
Cardiac Catheterization Procedure Note  Name: Melissa [name] MRN: [id number] DOB: 04-Nov-1940  Procedure: Right Heart Cath, Left Heart Cath, Selective Coronary Angiography, LV angiography, aortic root angiography  Indication: [name] is a complex 72 year old woman with coronary and valvular heart disease. She has end-stage renal disease. She developed congestive heart failure. There is a question of low gradient aortic stenosis. Her left ventricular function has deteriorated. She's undergone multiple PCI procedures in the past few years. She presents today for right and left heart catheterization.   Procedural Details: The right groin was prepped, draped, and anesthetized with 1% lidocaine. Using the modified Seldinger technique a 4 French sheath was placed in the right femoral artery and a 6 French sheath was placed in the right femoral vein. A multipurpose catheter was used for the right heart catheterization. Standard protocol was followed for recording of right heart pressures and sampling of oxygen saturations. Fick cardiac output was calculated. Standard Judkins catheters were used for selective coronary angiography and left ventriculography. There were no immediate procedural complications. The patient was transferred to the post catheterization recovery area for further monitoring.  Procedural Findings: Hemodynamics RA 7 RV 51/12 PA 51/24 with a mean of 35 PCWP A wave 26, V wave 27, mean of 22 LV 187/38 AO 159/59 with a mean of 100  Oxygen saturations: PA 69 AO 92  Cardiac Output (Fick) 6.5  Cardiac Index (Fick) 3.4  Aortic valve hemodynamics: Mean gradient 23, valve area 1.06 cm   Coronary angiography: Coronary dominance: right  Left mainstem: Moderately calcified. Widely patent without significant stenosis. Arise from the left cusp it divides into the LAD and left circumflex the  Left anterior descending (LAD): This is a large caliber vessel proximally. There is mild  20-30% ostial stenosis. There is a stent in the mid vessel with very tight 95% in-stent restenosis present. The stenosis is focal and occurs just beyond the first septal perforator. The stented segment continues and 2 what appears to be a second diagonal branch. I think the true LAD is extremely small and fills late. There is 90% stenosis just after the stented segment.  Left circumflex (LCx): The left circumflex is moderate in caliber. The proximal vessel has an 80% stenosis. The vessel supplies a tiny first OM and a moderate caliber second OM.  Right coronary artery (RCA): The RCA is a large, dominant vessel. The proximal through mid vessel is stented. The ostium of the vessel within the stent has 50% in-stent restenosis. The lumen appears fairly well preserved. The PDA and PLA branches are patent with 50% stenosis at the ostium of the PDA. There is also 50% stenosis in the posterior AV segment.  Left ventriculography: LV function is severely reduced. There is dyskinesis of the anterolateral wall. The estimated left ventricular ejection fraction is 35%.  Final Conclusions:   1. Severe multivessel coronary artery disease with critical mid LAD stenosis, severe proximal left circumflex stenosis, and moderate in-stent restenosis at the ostium of the right coronary artery. 2. Severe LV dysfunction 3. Moderate aortic stenosis  Recommendations: This is a very complex patient. She has multivessel coronary disease and at least moderate aortic stenosis. I was able to cross her valve with a pigtail catheter and I think it is clear she does not have critical aortic stenosis. I think she is an extremely poor candidate for surgical consideration considering her obesity, end-stage renal disease, marked deconditioning, and a multitude of other problems. The best treatment will probably be PCI for  LAD. I'm going to carefully study her old films as the lesion in her LAD is within a previously implanted stent. She has  undergone cutting balloon angioplasty as well. She may need placement of a second stent in this area. At that time would also consider PCI of her left circumflex.  [name] 08/29/2012, 10:20 AM

## 2012-08-31 ENCOUNTER — Encounter: Payer: Self-pay | Admitting: Cardiology

## 2012-08-31 NOTE — Progress Notes (Signed)
   See cath note 08/29/2012, Dr. Excell Seltzer.  Plan is for follow-up PCI. Leave aortic valve alone for now.  Jerral Bonito, MD

## 2012-09-01 ENCOUNTER — Other Ambulatory Visit: Payer: Self-pay | Admitting: Cardiology

## 2012-09-04 ENCOUNTER — Other Ambulatory Visit: Payer: Self-pay | Admitting: Cardiovascular Disease

## 2012-09-08 ENCOUNTER — Encounter (HOSPITAL_COMMUNITY): Payer: Self-pay | Admitting: General Practice

## 2012-09-08 ENCOUNTER — Ambulatory Visit (HOSPITAL_COMMUNITY)
Admission: RE | Admit: 2012-09-08 | Discharge: 2012-09-09 | Disposition: A | Discharge status: Home / Self Care | Payer: Medicare Other | Source: Ambulatory Visit | Attending: Cardiovascular Disease | Admitting: Cardiovascular Disease

## 2012-09-08 ENCOUNTER — Encounter (HOSPITAL_COMMUNITY)
Admission: RE | Disposition: A | Discharge status: Home / Self Care | Payer: Self-pay | Source: Ambulatory Visit | Attending: Cardiovascular Disease

## 2012-09-08 DIAGNOSIS — I2510 Atherosclerotic heart disease of native coronary artery without angina pectoris: Secondary | ICD-10-CM | POA: Insufficient documentation

## 2012-09-08 DIAGNOSIS — I509 Heart failure, unspecified: Secondary | ICD-10-CM | POA: Insufficient documentation

## 2012-09-08 DIAGNOSIS — N039 Chronic nephritic syndrome with unspecified morphologic changes: Secondary | ICD-10-CM | POA: Diagnosis present

## 2012-09-08 DIAGNOSIS — I350 Nonrheumatic aortic (valve) stenosis: Secondary | ICD-10-CM

## 2012-09-08 DIAGNOSIS — Y831 Surgical operation with implant of artificial internal device as the cause of abnormal reaction of the patient, or of later complication, without mention of misadventure at the time of the procedure: Secondary | ICD-10-CM | POA: Insufficient documentation

## 2012-09-08 DIAGNOSIS — I255 Ischemic cardiomyopathy: Secondary | ICD-10-CM

## 2012-09-08 DIAGNOSIS — D696 Thrombocytopenia, unspecified: Secondary | ICD-10-CM | POA: Insufficient documentation

## 2012-09-08 DIAGNOSIS — T82897A Other specified complication of cardiac prosthetic devices, implants and grafts, initial encounter: Secondary | ICD-10-CM | POA: Insufficient documentation

## 2012-09-08 DIAGNOSIS — Z9861 Coronary angioplasty status: Secondary | ICD-10-CM | POA: Insufficient documentation

## 2012-09-08 DIAGNOSIS — R609 Edema, unspecified: Secondary | ICD-10-CM | POA: Insufficient documentation

## 2012-09-08 DIAGNOSIS — D631 Anemia in chronic kidney disease: Secondary | ICD-10-CM | POA: Insufficient documentation

## 2012-09-08 DIAGNOSIS — I2589 Other forms of chronic ischemic heart disease: Secondary | ICD-10-CM | POA: Insufficient documentation

## 2012-09-08 DIAGNOSIS — Z992 Dependence on renal dialysis: Secondary | ICD-10-CM | POA: Insufficient documentation

## 2012-09-08 DIAGNOSIS — Z79899 Other long term (current) drug therapy: Secondary | ICD-10-CM | POA: Insufficient documentation

## 2012-09-08 DIAGNOSIS — I359 Nonrheumatic aortic valve disorder, unspecified: Secondary | ICD-10-CM | POA: Insufficient documentation

## 2012-09-08 DIAGNOSIS — I200 Unstable angina: Secondary | ICD-10-CM | POA: Insufficient documentation

## 2012-09-08 DIAGNOSIS — N186 End stage renal disease: Secondary | ICD-10-CM | POA: Insufficient documentation

## 2012-09-08 DIAGNOSIS — I10 Essential (primary) hypertension: Secondary | ICD-10-CM | POA: Diagnosis present

## 2012-09-08 DIAGNOSIS — I5022 Chronic systolic (congestive) heart failure: Secondary | ICD-10-CM | POA: Insufficient documentation

## 2012-09-08 DIAGNOSIS — I120 Hypertensive chronic kidney disease with stage 5 chronic kidney disease or end stage renal disease: Secondary | ICD-10-CM | POA: Insufficient documentation

## 2012-09-08 DIAGNOSIS — E785 Hyperlipidemia, unspecified: Secondary | ICD-10-CM | POA: Insufficient documentation

## 2012-09-08 HISTORY — DX: End stage renal disease: N18.6

## 2012-09-08 HISTORY — PX: CORONARY ANGIOPLASTY WITH STENT PLACEMENT: SHX49

## 2012-09-08 HISTORY — PX: PERCUTANEOUS CORONARY STENT INTERVENTION (PCI-S): SHX5485

## 2012-09-08 HISTORY — DX: Ischemic cardiomyopathy: I25.5

## 2012-09-08 HISTORY — DX: Nonrheumatic aortic (valve) stenosis: I35.0

## 2012-09-08 LAB — BASIC METABOLIC PANEL
Calcium: 10.2 mg/dL (ref 8.4–10.5)
GFR calc Af Amer: 7 mL/min — ABNORMAL LOW (ref >90)
GFR calc non Af Amer: 6 mL/min — ABNORMAL LOW (ref >90)
Glucose, Bld: 104 mg/dL — ABNORMAL HIGH (ref 70–99)
Sodium: 142 mEq/L (ref 135–145)

## 2012-09-08 LAB — PROTIME-INR: Prothrombin Time: 12.7 seconds (ref 11.6–15.2)

## 2012-09-08 LAB — POCT ACTIVATED CLOTTING TIME
Activated Clotting Time: 211 seconds
Activated Clotting Time: 222 seconds

## 2012-09-08 LAB — CBC
MCH: 31.8 pg (ref 26.0–34.0)
Platelets: 135 10*3/uL — ABNORMAL LOW (ref 150–400)
RBC: 3.52 MIL/uL — ABNORMAL LOW (ref 3.87–5.11)

## 2012-09-08 LAB — GLUCOSE, CAPILLARY: Glucose-Capillary: 116 mg/dL — ABNORMAL HIGH (ref 70–99)

## 2012-09-08 SURGERY — PERCUTANEOUS CORONARY STENT INTERVENTION (PCI-S)
Anesthesia: LOCAL

## 2012-09-08 MED ORDER — SODIUM CHLORIDE 0.9 % IJ SOLN: 3.0000 mL | Freq: Two times a day (BID) | INTRAMUSCULAR | Status: DC

## 2012-09-08 MED ORDER — DIAZEPAM 5 MG PO TABS
ORAL_TABLET | ORAL | Status: AC
  Filled 2012-09-08: qty 1

## 2012-09-08 MED ORDER — MORPHINE SULFATE 2 MG/ML IJ SOLN
INTRAMUSCULAR | Status: AC
  Administered 2012-09-08: 2 mg via INTRAVENOUS
  Filled 2012-09-08: qty 1

## 2012-09-08 MED ORDER — SODIUM CHLORIDE 0.9 % IJ SOLN: 3.0000 mL | INTRAMUSCULAR | Status: DC | PRN

## 2012-09-08 MED ORDER — ASPIRIN 81 MG PO CHEW
CHEWABLE_TABLET | ORAL | Status: AC
  Filled 2012-09-08: qty 4

## 2012-09-08 MED ORDER — SEVELAMER CARBONATE 800 MG PO TABS
1600.0000 mg | ORAL_TABLET | Freq: Two times a day (BID) | ORAL | Status: DC | PRN
  Filled 2012-09-08: qty 2

## 2012-09-08 MED ORDER — SODIUM CHLORIDE 0.9 % IV SOLN: INTRAVENOUS | Status: DC

## 2012-09-08 MED ORDER — HEPARIN SODIUM (PORCINE) 1000 UNIT/ML IJ SOLN
INTRAMUSCULAR | Status: AC
  Filled 2012-09-08: qty 1

## 2012-09-08 MED ORDER — ONDANSETRON HCL 4 MG/2ML IJ SOLN: 4.0000 mg | Freq: Four times a day (QID) | INTRAMUSCULAR | Status: DC | PRN

## 2012-09-08 MED ORDER — INSULIN GLARGINE 100 UNIT/ML ~~LOC~~ SOLN
10.0000 [IU] | Freq: Every day | SUBCUTANEOUS | Status: DC
  Administered 2012-09-08: 10 [IU] via SUBCUTANEOUS
  Filled 2012-09-08 (×2): qty 0.1

## 2012-09-08 MED ORDER — SODIUM CHLORIDE 0.9 % IJ SOLN
3.0000 mL | Freq: Two times a day (BID) | INTRAMUSCULAR | Status: DC
  Administered 2012-09-08: 3 mL via INTRAVENOUS

## 2012-09-08 MED ORDER — IRBESARTAN 150 MG PO TABS
150.0000 mg | ORAL_TABLET | Freq: Every day | ORAL | Status: DC
  Administered 2012-09-08 – 2012-09-09 (×2): 150 mg via ORAL
  Filled 2012-09-08 (×2): qty 1

## 2012-09-08 MED ORDER — ZOLPIDEM TARTRATE 5 MG PO TABS
5.0000 mg | ORAL_TABLET | Freq: Every evening | ORAL | Status: DC | PRN
  Administered 2012-09-09: 5 mg via ORAL
  Filled 2012-09-08 (×2): qty 1

## 2012-09-08 MED ORDER — NITROGLYCERIN 0.2 MG/ML ON CALL CATH LAB
INTRAVENOUS | Status: AC
  Filled 2012-09-08: qty 1

## 2012-09-08 MED ORDER — DOXERCALCIFEROL 4 MCG/2ML IV SOLN
4.0000 ug | INTRAVENOUS | Status: DC
  Administered 2012-09-09: 4 ug via INTRAVENOUS
  Filled 2012-09-08: qty 2

## 2012-09-08 MED ORDER — DOXAZOSIN MESYLATE 8 MG PO TABS
8.0000 mg | ORAL_TABLET | Freq: Every day | ORAL | Status: DC
  Administered 2012-09-08: 8 mg via ORAL
  Filled 2012-09-08 (×2): qty 1

## 2012-09-08 MED ORDER — MIDAZOLAM HCL 2 MG/2ML IJ SOLN
INTRAMUSCULAR | Status: AC
  Filled 2012-09-08: qty 2

## 2012-09-08 MED ORDER — LIDOCAINE HCL (PF) 1 % IJ SOLN
INTRAMUSCULAR | Status: AC
  Filled 2012-09-08: qty 30

## 2012-09-08 MED ORDER — SEVELAMER CARBONATE 800 MG PO TABS: 1600.0000 mg | ORAL_TABLET | Freq: Three times a day (TID) | ORAL | Status: DC

## 2012-09-08 MED ORDER — FENTANYL CITRATE 0.05 MG/ML IJ SOLN
INTRAMUSCULAR | Status: AC
  Filled 2012-09-08: qty 2

## 2012-09-08 MED ORDER — AMLODIPINE BESYLATE 5 MG PO TABS
5.0000 mg | ORAL_TABLET | Freq: Every day | ORAL | Status: DC
  Administered 2012-09-08 – 2012-09-09 (×2): 5 mg via ORAL
  Filled 2012-09-08 (×2): qty 1

## 2012-09-08 MED ORDER — INSULIN GLULISINE 100 UNIT/ML ~~LOC~~ SOLN: 5.0000 [IU] | Freq: Three times a day (TID) | SUBCUTANEOUS | Status: DC

## 2012-09-08 MED ORDER — CINACALCET HCL 30 MG PO TABS
30.0000 mg | ORAL_TABLET | Freq: Every day | ORAL | Status: DC
  Administered 2012-09-08: 30 mg via ORAL
  Filled 2012-09-08 (×2): qty 1

## 2012-09-08 MED ORDER — HEPARIN (PORCINE) IN NACL 2-0.9 UNIT/ML-% IJ SOLN
INTRAMUSCULAR | Status: AC
  Filled 2012-09-08: qty 1000

## 2012-09-08 MED ORDER — CLOPIDOGREL BISULFATE 75 MG PO TABS
75.0000 mg | ORAL_TABLET | Freq: Every day | ORAL | Status: DC
  Administered 2012-09-08 – 2012-09-09 (×2): 75 mg via ORAL
  Filled 2012-09-08 (×3): qty 1

## 2012-09-08 MED ORDER — INSULIN ASPART 100 UNIT/ML ~~LOC~~ SOLN
5.0000 [IU] | Freq: Three times a day (TID) | SUBCUTANEOUS | Status: DC
  Administered 2012-09-08: 13:00:00 5 [IU] via SUBCUTANEOUS

## 2012-09-08 MED ORDER — DARBEPOETIN ALFA-POLYSORBATE 25 MCG/0.42ML IJ SOLN
25.0000 ug | Freq: Once | INTRAMUSCULAR | Status: DC
  Filled 2012-09-08: qty 0.42

## 2012-09-08 MED ORDER — BIVALIRUDIN 250 MG IV SOLR
INTRAVENOUS | Status: AC
  Filled 2012-09-08: qty 250

## 2012-09-08 MED ORDER — OXYCODONE HCL 5 MG PO TABS
5.0000 mg | ORAL_TABLET | Freq: Four times a day (QID) | ORAL | Status: DC | PRN
  Administered 2012-09-08 – 2012-09-09 (×4): 5 mg via ORAL
  Filled 2012-09-08 (×3): qty 1

## 2012-09-08 MED ORDER — SEVELAMER CARBONATE 800 MG PO TABS
3200.0000 mg | ORAL_TABLET | Freq: Three times a day (TID) | ORAL | Status: DC
  Administered 2012-09-08 – 2012-09-09 (×3): 3200 mg via ORAL
  Filled 2012-09-08 (×6): qty 4

## 2012-09-08 MED ORDER — DARBEPOETIN ALFA-POLYSORBATE 25 MCG/0.42ML IJ SOLN
25.0000 ug | INTRAMUSCULAR | Status: DC
  Administered 2012-09-09: 25 ug via INTRAVENOUS
  Filled 2012-09-08: qty 0.42

## 2012-09-08 MED ORDER — TRAMADOL HCL 50 MG PO TABS
50.0000 mg | ORAL_TABLET | Freq: Four times a day (QID) | ORAL | Status: DC | PRN
  Administered 2012-09-09: 01:00:00 50 mg via ORAL
  Filled 2012-09-08 (×2): qty 1

## 2012-09-08 MED ORDER — DIAZEPAM 5 MG PO TABS
5.0000 mg | ORAL_TABLET | ORAL | Status: AC
  Administered 2012-09-08: 5 mg via ORAL

## 2012-09-08 MED ORDER — SIMVASTATIN 20 MG PO TABS
20.0000 mg | ORAL_TABLET | Freq: Every evening | ORAL | Status: DC
  Administered 2012-09-08: 20 mg via ORAL
  Filled 2012-09-08 (×2): qty 1

## 2012-09-08 MED ORDER — ACETAMINOPHEN 325 MG PO TABS: 650.0000 mg | ORAL_TABLET | ORAL | Status: DC | PRN

## 2012-09-08 MED ORDER — SODIUM CHLORIDE 0.9 % IV SOLN: 250.0000 mL | INTRAVENOUS | Status: DC | PRN

## 2012-09-08 MED ORDER — DOCUSATE SODIUM 100 MG PO CAPS
100.0000 mg | ORAL_CAPSULE | Freq: Every day | ORAL | Status: DC
  Administered 2012-09-08 – 2012-09-09 (×2): 100 mg via ORAL
  Filled 2012-09-08 (×2): qty 1

## 2012-09-08 MED ORDER — CARVEDILOL 12.5 MG PO TABS
12.5000 mg | ORAL_TABLET | Freq: Two times a day (BID) | ORAL | Status: DC
  Administered 2012-09-08 – 2012-09-09 (×3): 12.5 mg via ORAL
  Filled 2012-09-08 (×4): qty 1

## 2012-09-08 MED ORDER — ASPIRIN 81 MG PO CHEW
324.0000 mg | CHEWABLE_TABLET | ORAL | Status: AC
  Administered 2012-09-08: 324 mg via ORAL

## 2012-09-08 MED ORDER — INSULIN GLARGINE 100 UNIT/ML ~~LOC~~ SOLN: 5.0000 [IU] | Freq: Every day | SUBCUTANEOUS | Status: DC

## 2012-09-08 MED ORDER — ASPIRIN EC 81 MG PO TBEC
81.0000 mg | DELAYED_RELEASE_TABLET | Freq: Every day | ORAL | Status: DC
  Administered 2012-09-08 – 2012-09-09 (×2): 81 mg via ORAL
  Filled 2012-09-08 (×2): qty 1

## 2012-09-08 MED ORDER — NITROGLYCERIN 0.4 MG SL SUBL: 0.4000 mg | SUBLINGUAL_TABLET | SUBLINGUAL | Status: DC | PRN

## 2012-09-08 MED ORDER — MORPHINE SULFATE 2 MG/ML IJ SOLN
2.0000 mg | INTRAMUSCULAR | Status: DC | PRN
  Administered 2012-09-08 (×6): 2 mg via INTRAVENOUS
  Filled 2012-09-08 (×5): qty 1

## 2012-09-08 MED ORDER — VERAPAMIL HCL 2.5 MG/ML IV SOLN
INTRAVENOUS | Status: AC
  Filled 2012-09-08: qty 2

## 2012-09-08 NOTE — CV Procedure (Signed)
   CARDIAC CATH NOTE  Name: Melissa [name] MRN: [id number] DOB: April 28, 1940  Procedure: PTCA and stenting of the mid LAD  Indication: CCS class III angina, on maximal medical therapy. This is a 72 year old woman with multiple medical comorbid conditions. She has end-stage renal disease on dialysis. She's had previous PCI. She presents with worsening left ventricular function, class III heart failure and angina. She underwent diagnostic cardiac catheterization demonstrating high-grade mid LAD stenosis within a previously stented segment. She has undergone cutting balloon angioplasty of this area in the past and it has restenosed aggressively. She is brought in today for elective PCI with plans for re-stenting.  Procedural Details: The right wrist was prepped, draped, and anesthetized with 1% lidocaine. Using the modified Seldinger technique, a 5/6 Fr slender sheath was introduced into the radial artery. 3 mg verapamil was administered through the radial sheath. Weight-based heparin was given for anticoagulation. A total of 12,000 units was administered during the procedure. Once a therapeutic ACT was achieved, a 6 [place] XB LAD 3.5 cm guide catheter was inserted.  A BMW coronary guidewire was used to cross the lesion.  The lesion was predilated with a 2.0 x 12 mm balloon.  The lesion was then stented with a 3.0 x 12 mm Promus Premier drug-eluting stent.  The stent was postdilated with a 3.25 mm noncompliant balloon to 20 atmospheres.  Following PCI, there was 0% residual stenosis and TIMI-3 flow. Final angiography confirmed an excellent result. The patient tolerated the procedure well. There were no immediate procedural complications. A TR band was used for radial hemostasis. The patient was transferred to the post catheterization recovery area for further monitoring.  Lesion Data: Vessel: LAD/mid Percent stenosis (pre): 95 TIMI-flow (pre):  3 Stent:  3.0 x 12 mm Promus Premier Percent stenosis (post):  0 TIMI-flow (post): 3  Conclusions: Successful stenting of the mid LAD as detailed above  Recommendations: Dual antiplatelet therapy with aspirin and Plavix for a minimum of 12 months, but would favor indefinite if she tolerates. She has aggressive CAD with multiple stents.  [name] 09/08/2012, 8:43 AM

## 2012-09-08 NOTE — Plan of Care (Signed)
Problem: Consults Goal: PCI Patient Education (See Patient Education module for education specifics.) Outcome: Completed/Met Date Met:  09/08/12 Post radial cath instructions given and reviewed w/ pt and her son, both voiced understanding.

## 2012-09-08 NOTE — Consult Note (Signed)
Referring Provider: No ref. provider found Primary Care Physician:  Donzetta Sprung, MD Primary Nephrologist:  Dr. Gillian Scarce  Reason for Consultation: Medical management of ESRD  HPI: Known ESRD 6 yrs dialysis at [facility]. Admitted for Right Heart Cath, Left Heart Cath, Selective Coronary Angiography, LV angiography, aortic root angiography. Moderate Aortic stenosis and advised to leave stenosis at present.     Past Medical History  Diagnosis Date  . Coronary artery disease     catheterization March 2011.(presentation wtih mild CHF and mild increased troponin))..70% proximal/80% mid LAD,50% circumflex.Marland KitchenLAD could be approached but with difficulty....medicall therapy recommended/NSTEMI 01/31/2010 with sepsis and anemia...EF 50%...medical Rx,  . Diabetes mellitus   . Hyperlipidemia   . Aortic stenosis     mild to moderate...echo.Marland KitchenMarland KitchenSeptember,2006/catheterization valve area 1.9cm square/mod/severe....echo...10/2009/mod/severe....echo....01/31/2010,  . 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 Hypertension   . Anemia   . Chronic kidney disease     ESRD...dialysis  . Carotid artery disease     doppler 2011 /  doppler 05/08/2010 similar, <50% bilateral with some plaque  . Edema   . Ejection fraction     EF 50%, cardiac catheterization, January, 2012  . Myocardial infarction   . Anginal pain   . Dysrhythmia   . Heart murmur   . Arthritis   . Second degree Mobitz II AV block     [employer] Accent DR RF dual-chamber PPM (model PM [id number]; serial # [id number])  . CVA (cerebral vascular accident) 2007    right side weakness  . Pacemaker   . Dialysis patient   . Cardiomyopathy   . Shortness of breath     Past Surgical History  Procedure Laterality Date  . Appendectomy    . Oophorectomy    . Toe amputation      left great toe  . Retinal detachment surgery    . Cataract extraction    . Coronary angioplasty    . Cholecystectomy    . Avf       Left Arm  . Revision of arteriovenous goretex graft Left 03/21/2012    Procedure: REVISION OF ARTERIOVENOUS GORETEX GRAFT;  Surgeon: Chuck Hint, MD;  Location: [facility] OR;  Service: Vascular;  Laterality: Left;  Revision of left arm arteriovenous gortex graft; replace venous half of graft  . Pacemaker insertion  12/10/11    SJM Accent DR RF implanted by Dr Johney Frame for mobitz II AV block  . Coronary angioplasty with stent placement  09/08/2012    LAD    Prior to Admission medications   Medication Sig Start Date End Date Taking? Authorizing Provider  amLODipine (NORVASC) 5 MG tablet Take 5 mg by mouth daily.   Yes Historical Provider, MD  aspirin EC 81 MG tablet Take 81 mg by mouth daily.   Yes Historical Provider, MD  carvedilol (COREG) 12.5 MG tablet Take 12.5 mg by mouth 2 (two) times daily with a meal.   Yes Historical Provider, MD  cinacalcet (SENSIPAR) 30 MG tablet Take 30 mg by mouth daily.    Yes Historical Provider, MD  clopidogrel (PLAVIX) 75 MG tablet TAKE (1) TABLET BY MOUTH ONCE DAILY. 07/19/12  Yes [name], MD  docusate sodium (COLACE) 100 MG capsule Take 100 mg by mouth daily.   Yes Historical Provider, MD  doxazosin (CARDURA) 8 MG tablet Take 8 mg by mouth at bedtime.   Yes Historical Provider, MD  hydrALAZINE (APRESOLINE) 50 MG tablet Take 50 mg by mouth  daily.    Yes Historical Provider, MD  HYDROcodone-acetaminophen (NORCO/VICODIN) 5-325 MG per tablet Take 1 tablet by mouth every 6 (six) hours as needed for pain.  07/10/12  Yes Historical Provider, MD  insulin glargine (LANTUS) 100 UNIT/ML injection Inject 5-10 Units into the skin at bedtime. Per sliding scale.  Patient does not take if blood sugar is 100 or below.   Yes Historical Provider, MD  insulin glulisine (APIDRA) 100 UNIT/ML injection Inject 5-15 Units into the skin 3 (three) times daily before meals. Per sliding scale   Yes Historical Provider, MD  Multiple Vitamin (DAILY VITAMIN FORMULA PO) Take 3 mg by  mouth daily. Folic acid- vitamin b complex - vitamin c - selenium - zinc 3 mg tablet.   Yes Historical Provider, MD  olmesartan (BENICAR) 40 MG tablet Take 40 mg by mouth daily.     Yes Historical Provider, MD  omeprazole (PRILOSEC) 20 MG capsule Take 20 mg by mouth daily.     Yes Historical Provider, MD  polyethylene glycol powder (GLYCOLAX/MIRALAX) powder Take 68 g by mouth 3 (three) times a week. Tuesday, Thursday, Sunday   Yes Historical Provider, MD  sevelamer carbonate (RENVELA) 800 MG tablet Take 1,600-3,200 mg by mouth 3 (three) times daily with meals. Takes 4 tablets with each meal and takes 2 tablets with every snack.   Yes Historical Provider, MD  simvastatin (ZOCOR) 40 MG tablet Take 40 mg by mouth every evening.   Yes Historical Provider, MD  sodium polystyrene (KAYEXALATE) 15 GM/60ML suspension Take 15 g by mouth See admin instructions. Take on Sundays tuesdays and thursdays only   Yes Historical Provider, MD  amLODipine (NORVASC) 5 MG tablet TAKE (1) TABLET BY MOUTH ONCE DAILY. 09/01/12   [name], MD  nitroGLYCERIN (NITROSTAT) 0.4 MG SL tablet Place 1 tablet (0.4 mg total) under the tongue every 5 (five) minutes as needed. For chest pain 03/24/12   Luis Abed, MD  oxycodone (OXY-IR) 5 MG capsule Take 5 mg by mouth every 6 (six) hours as needed for pain.    Historical Provider, MD  traMADol (ULTRAM) 50 MG tablet Take 50 mg by mouth every 6 (six) hours as needed for pain.    Historical Provider, MD    Current Facility-Administered Medications  Medication Dose Route Frequency Provider Last Rate Last Dose  . acetaminophen (TYLENOL) tablet 650 mg  650 mg Oral Q4H PRN [name], MD      . amLODipine (NORVASC) tablet 5 mg  5 mg Oral Daily [name], MD   5 mg at 09/08/12 1215  . aspirin EC tablet 81 mg  81 mg Oral Daily [name], MD   81 mg at 09/08/12 1214  . carvedilol (COREG) tablet 12.5 mg  12.5 mg Oral BID WC [name], MD   12.5 mg at 09/08/12 1213  .  cinacalcet (SENSIPAR) tablet 30 mg  30 mg Oral Q lunch [name], MD      . clopidogrel (PLAVIX) tablet 75 mg  75 mg Oral Q breakfast [name], MD   75 mg at 09/08/12 1214  . docusate sodium (COLACE) capsule 100 mg  100 mg Oral Daily [name], MD      . doxazosin (CARDURA) tablet 8 mg  8 mg Oral QHS [name], MD      . insulin aspart (novoLOG) injection 5-15 Units  5-15 Units Subcutaneous TID WC [name], MD   5 Units at 09/08/12 1244  . insulin glargine (LANTUS) injection  10 Units  10 Units Subcutaneous QHS [name], MD      . irbesartan (AVAPRO) tablet 150 mg  150 mg Oral Daily [name], MD      . morphine 2 MG/ML injection 2 mg  2 mg Intravenous Q1H PRN [name], MD   2 mg at 09/08/12 1456  . nitroGLYCERIN (NITROSTAT) SL tablet 0.4 mg  0.4 mg Sublingual Q5 min PRN [name], MD      . ondansetron [facility]) injection 4 mg  4 mg Intravenous Q6H PRN [name], MD      . oxyCODONE (Oxy IR/ROXICODONE) immediate release tablet 5 mg  5 mg Oral Q6H PRN [name], MD   5 mg at 09/08/12 1059  . sevelamer carbonate (RENVELA) tablet 1,600 mg  1,600 mg Oral BID PRN [name], MD      . sevelamer carbonate (RENVELA) tablet 3,200 mg  3,200 mg Oral TID WC [name], MD   3,200 mg at 09/08/12 06-18-15  . simvastatin (ZOCOR) tablet 20 mg  20 mg Oral QPM [name], MD      . traMADol Janean Sark) tablet 50 mg  50 mg Oral Q6H PRN [name], MD        Allergies as of 08/29/2012 - Review Complete 08/29/2012  Allergen Reaction Noted  . Codeine Nausea And Vomiting 06/16/2011    Family History  Problem Relation Age of Onset  . Coronary artery disease Mother     died age 81  . Diabetes type II Mother   . Other Father     died in 2022/06/18 of accident  . Diabetes type II Daughter   . Hiatal hernia Son     History   Social History  . Marital Status: Divorced    Spouse Name: N/A    Number of Children: N/A  . Years of Education: N/A    Occupational History  . Not on file.   Social History Main Topics  . Smoking status: Never Smoker   . Smokeless tobacco: Never Used  . Alcohol Use: No  . Drug Use: No  . Sexually Active: No   Other Topics Concern  . Not on file   Social History Narrative   Lives in [place] alone.    Divorced and has four children.   Has home health aide 5 days/week    Review of Systems: Gen: Denies any fever, chills, sweats, anorexia, fatigue, weakness, malaise, weight loss, and sleep disorder HEENT: No visual complaints, No history of Retinopathy. Normal external appearance No Epistaxis or Sore throat. No sinusitis.   CV: Denies chest pain, angina, palpitations, syncope, orthopnea, PND, peripheral edema, and claudication. Resp: Denies dyspnea at rest, dyspnea with exercise, cough, sputum, wheezing, coughing up blood, and pleurisy. GI: Denies vomiting blood, jaundice, and fecal incontinence.   Denies dysphagia or odynophagia. GU : Denies urinary burning, blood in urine, urinary frequency, urinary hesitancy, nocturnal urination, and urinary incontinence.  No renal calculi. MS: Denies joint pain, limitation of movement, and swelling, stiffness, low back pain, extremity pain. Denies muscle weakness, cramps, atrophy.  No use of non steroidal antiinflammatory drugs. Derm: Denies rash, itching, dry skin, hives, moles, warts, or unhealing ulcers.  Psych: Denies depression, anxiety, memory loss, suicidal ideation, hallucinations, paranoia, and confusion. Heme: Denies bruising, bleeding, and enlarged lymph nodes. Neuro: No headache.  No diplopia. No dysarthria.  No dysphasia.  No history of CVA.  No Seizures. No paresthesias.  No weakness. Endocrine No DM.  No Thyroid disease.  No  Adrenal disease.  Physical Exam: Vital signs in last 24 hours: Temp:  [97.5 F (36.4 C)-97.7 F (36.5 C)] 97.5 F (36.4 C) (08/11 0947) Pulse Rate:  [66-91] 69 (08/11 1300) Resp:  [18] 18 (08/11 0554) BP: (119-176)/(44-113)  138/44 mmHg (08/11 1300) SpO2:  [94 %-99 %] 95 % (08/11 1300) Weight:  [86.183 kg (190 lb)] 86.183 kg (190 lb) (08/11 0554) Last BM Date: 09/08/12 General:   Alert,  Elderly no acute distress Head:  Normocephalic and atraumatic. Eyes:  Sclera clear, no icterus.   Conjunctiva pink. Ears:  Normal auditory acuity. Nose:  No deformity, discharge,  or lesions. Mouth:  No deformity or lesions, dentition normal. Neck:  Supple; no masses or thyromegaly. JVP not elevated Lungs:  Clear throughout to auscultation.   No wheezes, crackles, or rhonchi. No acute distress. Heart:  Regular rate and rhythm; systolic murmur 3/6 Abdomen:  Soft, nontender and nondistended. No masses, hepatosplenomegaly or hernias noted. Normal bowel sounds, without guarding, and without rebound.   Msk:  Symmetrical without gross deformities. Normal posture. Pulses:  No carotid, renal, femoral bruits. DP and PT symmetrical and equal Extremities:  Without clubbing or edema. AVG thrill and bruit Left Neurologic:  Alert and  oriented x4;  grossly normal neurologically. Skin:  Intact without significant lesions or rashes. Cervical Nodes:  No significant cervical adenopathy. Psych:  Alert and cooperative. Normal mood and affect.  Intake/Output from previous day:   Intake/Output this shift: Total I/O In: 120 [P.O.:120] Out: -   Lab Results:  Recent Labs  09/08/12 0630  WBC 6.6  HGB 11.2*  HCT 35.4*  PLT 135*   BMET  Recent Labs  09/08/12 0630  NA 142  K 4.9  CL 99  CO2 28  GLUCOSE 104*  BUN 48*  CREATININE 6.50*  CALCIUM 10.2   LFT No results found for this basename: PROT, ALBUMIN, AST, ALT, ALKPHOS, BILITOT, BILIDIR, IBILI,  in the last 72 hours PT/INR  Recent Labs  09/08/12 0630  LABPROT 12.7  INR 0.97   Hepatitis Panel No results found for this basename: HEPBSAG, HCVAB, HEPAIGM, HEPBIGM,  in the last 72 hours  Studies/Results: No results found.  Assessment/Plan:  ESRD-TTS dialysis runs  3hrs  ANEMIA-Epogen 4400  MBD-hectoral  HTN/VOL-remove 2-3 L  ACCESS-AVG I have seen and examined this patient and agree with the plan of care. Patient status post cardiac cath, no intervention . Evaluation for management of ESRD. Vitals are stable with no distress and well controlled volume status. Evaluation of out patient records indicates that he receives dialysis TTS at [facility].She is dialyzed for 3 hrs 15 minutes  with a 2.5 calcium and 2 potassium dialysate and linear sodium 138  She receives erythropoietin and iron to keep his Hb above 10 and IV Vitamin D and phosphate binders to keep his Bone mineral goals at [facility]. He has a functioning AVG with no problems during usage. We shall proceed with in hospital scheduled dialysis    LOS: 0 Melissa [name] W @TODAY @3 :24 PM

## 2012-09-08 NOTE — Progress Notes (Signed)
TR BAND REMOVAL  LOCATION:  right radial  DEFLATED PER PROTOCOL:  yes  TIME BAND OFF / DRESSING APPLIED:   1300   SITE UPON ARRIVAL:   Level 0  SITE AFTER BAND REMOVAL:  Level 0  REVERSE ALLEN'S TEST:    positive  CIRCULATION SENSATION AND MOVEMENT:  Within Normal Limits  yes  COMMENTS:  Right hand pain since procedure; much improved at this time.

## 2012-09-08 NOTE — Progress Notes (Signed)
Patient and family brought to my attention bruise (3.5 X 2.5 inches) with hematoma (1.5 inch diameter) to left upper arm, posterior aspect.  Denies pain.  Patient and family both feel that this happened prior to admission today, and that patient probably bumped arm at home (a fall at home was reported).  Jeanella Craze, PA aware.

## 2012-09-08 NOTE — Progress Notes (Signed)
Blood Pressures taken at left lower leg.

## 2012-09-08 NOTE — H&P (View-Only) (Signed)
    Updated history and physical:  I saw the patient in the office on July 21, 2012. There is an extensive note from that day. The next day I spoke with Dr. Cooper and plans were made for catheterization. At that time I completed the history and physical for the catheterization.  On July 25 the patient came in the office to finalize her lab work. At that time she was stable. Her blood pressure was stable. She had no cardiac complaints. She was doing well. We finalize plans for her catheterization.  The patient is stable with no significant changes since the original evaluation on July 21, 2012. Plans are to proceed with catheterization.  Jeff Katz, MD 

## 2012-09-08 NOTE — Interval H&P Note (Signed)
History and Physical Interval Note:  09/08/2012 7:43 AM  Melissa [name]  has presented today for surgery, with the diagnosis of CAD  The various methods of treatment have been discussed with the patient and family. After consideration of risks, benefits and other options for treatment, the patient has consented to  Procedure(s): PERCUTANEOUS CORONARY STENT INTERVENTION (PCI-S) (N/A) as a surgical intervention .  The patient's history has been reviewed, patient examined, no change in status, stable for surgery.  I have reviewed the patient's chart and labs.  Questions were answered to the patient's satisfaction.    Pt has had diagnostic cath showing severe 2 vessel CAD with critical LAD and severe LCx stenosis. Plan for PCI today.   Cath Lab Visit (complete for each Cath Lab visit)  Clinical Evaluation Leading to the Procedure:   ACS: no  Non-ACS:    Anginal Classification: CCS III  Anti-ischemic medical therapy: Maximal Therapy (2 or more classes of medications)  Non-Invasive Test Results: No non-invasive testing performed  Prior CABG: No previous CABG  [name]

## 2012-09-09 ENCOUNTER — Encounter (HOSPITAL_COMMUNITY): Payer: Self-pay | Admitting: Nurse Practitioner

## 2012-09-09 DIAGNOSIS — D631 Anemia in chronic kidney disease: Secondary | ICD-10-CM

## 2012-09-09 DIAGNOSIS — I359 Nonrheumatic aortic valve disorder, unspecified: Secondary | ICD-10-CM

## 2012-09-09 DIAGNOSIS — I255 Ischemic cardiomyopathy: Secondary | ICD-10-CM

## 2012-09-09 DIAGNOSIS — I2510 Atherosclerotic heart disease of native coronary artery without angina pectoris: Secondary | ICD-10-CM

## 2012-09-09 DIAGNOSIS — N186 End stage renal disease: Secondary | ICD-10-CM

## 2012-09-09 DIAGNOSIS — I200 Unstable angina: Secondary | ICD-10-CM

## 2012-09-09 LAB — BASIC METABOLIC PANEL
CO2: 28 mEq/L (ref 19–32)
Glucose, Bld: 91 mg/dL (ref 70–99)
Potassium: 5.4 mEq/L — ABNORMAL HIGH (ref 3.5–5.1)
Sodium: 139 mEq/L (ref 135–145)

## 2012-09-09 LAB — RENAL FUNCTION PANEL
Albumin: 3.1 g/dL — ABNORMAL LOW (ref 3.5–5.2)
CO2: 27 mEq/L (ref 19–32)
Calcium: 9.3 mg/dL (ref 8.4–10.5)
GFR calc Af Amer: 5 mL/min — ABNORMAL LOW (ref >90)
GFR calc non Af Amer: 4 mL/min — ABNORMAL LOW (ref >90)
Phosphorus: 6 mg/dL — ABNORMAL HIGH (ref 2.3–4.6)
Sodium: 139 mEq/L (ref 135–145)

## 2012-09-09 LAB — CBC
Hemoglobin: 9.5 g/dL — ABNORMAL LOW (ref 12.0–15.0)
MCH: 31.4 pg (ref 26.0–34.0)
Platelets: 130 10*3/uL — ABNORMAL LOW (ref 150–400)
RBC: 2.97 MIL/uL — ABNORMAL LOW (ref 3.87–5.11)
RBC: 2.99 MIL/uL — ABNORMAL LOW (ref 3.87–5.11)
RDW: 16.4 % — ABNORMAL HIGH (ref 11.5–15.5)

## 2012-09-09 MED ORDER — PANTOPRAZOLE SODIUM 40 MG PO TBEC: 40.0000 mg | DELAYED_RELEASE_TABLET | Freq: Every day | ORAL | Status: DC

## 2012-09-09 NOTE — Progress Notes (Signed)
1225-1300 Did not walk with pt since PT to evaluate soon. Education completed with pt. Referred walking instructions to PT. Reviewed NTG use, stent and CRP 2. Pt does not want referral to CRP 2 since dialysis is three days a week.  Sees dietician at dialysis so refer renal diet and diabetic diet to their expertise. Luetta Nutting RNBSN

## 2012-09-09 NOTE — Discharge Summary (Signed)
Patient ID: CARMIN ALVIDREZ,  MRN: [id number], DOB/AGE: September 06, 1940 72 y.o.  Admit date: 09/08/2012 Discharge date: 09/09/2012  Primary Care Provider: Donzetta Sprung Primary Cardiologist: Lovena Neighbours, MD  Discharge Diagnoses Principal Problem:   Unstable angina  **s/p PCI/DES to the LAD this admission.  Active Problems:   Coronary artery disease   End stage renal disease   Ischemic Cardiomyopathy   Moderate Aortic stenosis   Hypertension   Anemia in chronic kidney disease   Hyperlipidemia   Thrombocytopenia  Allergies Allergies  Allergen Reactions  . Codeine Nausea And Vomiting   Procedures  Percutaneous Coronary Intervention 8.11.2014  Successful PCI and stenting of the LAD secondary to severe in-stent restenosis, with placement of a 3.0 x 12 mm Promus Premier drug-eluting stent.  History of Present Illness  72 year old female with prior history of coronary artery disease status post prior stenting of the LAD and also right coronary artery. In June of 2014, she was admitted to [facility] with congestive heart failure. Echocardiogram at that time showed an EF of 25-30% which was a significant reduction from prior recordings. She was also noted to have moderate to severe aortic stenosis by echocardiogram. She followed up in the office with Dr. Myrtis Ser in late June and after review of records, decision was made to pursue diagnostic cardiac catheterization to reevaluate coronary anatomy as a possible explanation for reduced LV function and also to assess her degree of aortic stenosis. Diagnostic catheterization was performed on August 1 and showed severe in-stent restenosis within the LAD. Aortic stenosis was felt to be moderate. Patient was felt to be a poor surgical candidate secondary to comorbidities and after additional film review, decision was made to pursue percutaneous intervention within the LAD.  Hospital Course  Patient presented to the cardiac catheterization laboratory at  [facility] on August 11 and underwent successful PCI and drug-eluting stent placement within the LAD with use of a 3.0 x 12 mm Promus Premier drug-eluting stent. Patient tolerated procedure well and post procedure has ambulated without recurrent chest pain or dyspnea. She's been followed by nephrology during this hospitalization and has undergone dialysis this morning. We plan to discharge her later this afternoon after completion of dialysis.  Discharge Vitals Blood pressure 98/39, pulse 62, temperature 97.9 F (36.6 C), temperature source Oral, resp. rate 16, height 5\' 3"  (1.6 m), weight 199 lb 8.3 oz (90.5 kg), SpO2 96.00%.  Filed Weights   09/08/12 0554 09/09/12 0012 09/09/12 0715  Weight: 190 lb (86.183 kg) 196 lb 3.4 oz (89 kg) 199 lb 8.3 oz (90.5 kg)    Labs  CBC  Recent Labs  09/09/12 0410 09/09/12 0630  WBC 6.2 6.2  HGB 9.5* 9.4*  HCT 30.1* 30.1*  MCV 101.3* 100.7*  PLT 124* 130*   Basic Metabolic Panel  Recent Labs  09/09/12 0410 09/09/12 0700  NA 139 139  K 5.4* 5.6*  CL 98 100  CO2 28 27  GLUCOSE 91 82  BUN 62* 65*  CREATININE 7.78* 8.00*  CALCIUM 9.3 9.3  PHOS  --  6.0*   Disposition  Pt is being discharged home today in good condition.  Follow-up Plans & Appointments      Follow-up Information   Follow up with Willa Rough, MD On 10/10/2012. (11:15 AM)    Contact information:   [address] [place], [place] [phone]      Follow up with Donzetta Sprung, MD. (as scheduled.)    Contact information:  [address] [place] [address] [phone]      Discharge Medications    Medication List    STOP taking these medications       omeprazole 20 MG capsule  Commonly known as:  PRILOSEC      TAKE these medications       amLODipine 5 MG tablet  Commonly known as:  NORVASC  TAKE (1) TABLET BY MOUTH ONCE DAILY.     aspirin EC 81 MG tablet  Take 81 mg by mouth daily.     carvedilol 12.5 MG tablet  Commonly known as:   COREG  Take 12.5 mg by mouth 2 (two) times daily with a meal.     cinacalcet 30 MG tablet  Commonly known as:  SENSIPAR  Take 30 mg by mouth daily.     clopidogrel 75 MG tablet  Commonly known as:  PLAVIX  TAKE (1) TABLET BY MOUTH ONCE DAILY.     DAILY VITAMIN FORMULA PO  Take 3 mg by mouth daily. Folic acid- vitamin b complex - vitamin c - selenium - zinc 3 mg tablet.     docusate sodium 100 MG capsule  Commonly known as:  COLACE  Take 100 mg by mouth daily.     doxazosin 8 MG tablet  Commonly known as:  CARDURA  Take 8 mg by mouth at bedtime.     hydrALAZINE 50 MG tablet  Commonly known as:  APRESOLINE  Take 50 mg by mouth daily.     HYDROcodone-acetaminophen 5-325 MG per tablet  Commonly known as:  NORCO/VICODIN  Take 1 tablet by mouth every 6 (six) hours as needed for pain.     insulin glargine 100 UNIT/ML injection  Commonly known as:  LANTUS  Inject 5-10 Units into the skin at bedtime. Per sliding scale.  Patient does not take if blood sugar is 100 or below.     insulin glulisine 100 UNIT/ML injection  Commonly known as:  APIDRA  Inject 5-15 Units into the skin 3 (three) times daily before meals. Per sliding scale     nitroGLYCERIN 0.4 MG SL tablet  Commonly known as:  NITROSTAT  Place 1 tablet (0.4 mg total) under the tongue every 5 (five) minutes as needed. For chest pain     olmesartan 40 MG tablet  Commonly known as:  BENICAR  Take 40 mg by mouth daily.     oxycodone 5 MG capsule  Commonly known as:  OXY-IR  Take 5 mg by mouth every 6 (six) hours as needed for pain.     pantoprazole 40 MG tablet  Commonly known as:  PROTONIX  Take 1 tablet (40 mg total) by mouth daily.     polyethylene glycol powder powder  Commonly known as:  GLYCOLAX/MIRALAX  Take 68 g by mouth 3 (three) times a week. Tuesday, Thursday, Sunday     sevelamer carbonate 800 MG tablet  Commonly known as:  RENVELA  Take 1,600-3,200 mg by mouth 3 (three) times daily with meals. Takes  4 tablets with each meal and takes 2 tablets with every snack.     simvastatin 40 MG tablet  Commonly known as:  ZOCOR  Take 40 mg by mouth every evening.     sodium polystyrene 15 GM/60ML suspension  Commonly known as:  KAYEXALATE  Take 15 g by mouth See admin instructions. Take on Sundays tuesdays and thursdays only     traMADol 50 MG tablet  Commonly known as:  Janean Sark  Take 50 mg by mouth every 6 (six) hours as needed for pain.      Outstanding Labs/Studies  none  Duration of Discharge Encounter   Greater than 30 minutes including physician time.  Signed, Nicolasa Ducking NP 09/09/2012, 9:43 AM

## 2012-09-09 NOTE — Care Management Note (Signed)
    Page 1 of 1   09/09/2012     3:14:13 PM   CARE MANAGEMENT NOTE 09/09/2012  Patient:  Melissa, [name]   Account Number:  [id number]  Date Initiated:  09/09/2012  Documentation initiated by:  Melissa [name]  Subjective/Objective Assessment:   72 yo presented today for surgery, with the diagnosis of CAD//home alone     Action/Plan:   PERCUTANEOUS CORONARY STENT INTERVENTION (PCI-S)//home with Home Health   Anticipated DC Date:  09/09/2012   Anticipated DC Plan:  HOME W HOME HEALTH SERVICES      DC Planning Services  CM consult      [facility] Choice  HOME HEALTH   Choice offered to / List presented to:  [name]        HH arranged  HH-2 PT      HH agency  [facility]   Status of service:  Completed, signed off Medicare Important Message given?   (If response is "NO", the following Medicare IM given date fields will be blank) Date Medicare IM given:   Date Additional Medicare IM given:    Discharge Disposition:    Per UR Regulation:  Reviewed for med. necessity/level of care/duration of stay  If discussed at Long Length of Stay Meetings, dates discussed:    Comments:  09/09/12 1500  Melissa Cohn, RN, BSN, [employer] [phone] Spoke with pt at [facility] regarding discharge planning for Home Health PT services.  Pt familiar with [facility] and wishes to receive services through them.  Kizzie Furnish of [facility] notified.  No DME needs identified at this thime.

## 2012-09-09 NOTE — Progress Notes (Signed)
Patient Name: Melissa [name] Date of Encounter: 09/09/2012   Principal Problem:   Unstable angina Active Problems:   Coronary artery disease   End stage renal disease   Aortic stenosis   Hypertension   Anemia in chronic kidney disease(285.21)   Hyperlipidemia   Thrombocytopenia   SUBJECTIVE  No chest pain or sob overnight.  C/O right hand cramping.  No wrist pain.  For dialysis today.  CURRENT MEDS . amLODipine  5 mg Oral Daily  . aspirin EC  81 mg Oral Daily  . carvedilol  12.5 mg Oral BID WC  . cinacalcet  30 mg Oral Q lunch  . clopidogrel  75 mg Oral Q breakfast  . darbepoetin (ARANESP) injection - DIALYSIS  25 mcg Intravenous Q Tue-HD  . docusate sodium  100 mg Oral Daily  . doxazosin  8 mg Oral QHS  . doxercalciferol  4 mcg Intravenous Q T,Th,Sa-HD  . insulin aspart  5-15 Units Subcutaneous TID WC  . insulin glargine  10 Units Subcutaneous QHS  . irbesartan  150 mg Oral Daily  . sevelamer carbonate  3,200 mg Oral TID WC  . simvastatin  20 mg Oral QPM  . sodium chloride  3 mL Intravenous Q12H   OBJECTIVE  Filed Vitals:   09/08/12 2000 09/08/12 2354 09/09/12 0012 09/09/12 0634  BP: 135/41 129/48  98/57  Pulse: 67 72  60  Temp: 98.1 F (36.7 C) 98.3 F (36.8 C)  98.1 F (36.7 C)  TempSrc: Oral Oral  Oral  Resp: 18 22  17   Height:      Weight:   196 lb 3.4 oz (89 kg)   SpO2: 94% 97%  96%    Intake/Output Summary (Last 24 hours) at 09/09/12 0702 Last data filed at 09/09/12 0500  Gross per 24 hour  Intake    600 ml  Output      0 ml  Net    600 ml   Filed Weights   09/08/12 0554 09/09/12 0012  Weight: 190 lb (86.183 kg) 196 lb 3.4 oz (89 kg)   PHYSICAL EXAM  General: Pleasant, NAD. Neuro: Alert and oriented X 3. Moves all extremities spontaneously. Psych: Normal affect. HEENT:  Normal  Neck: Supple without bruits or JVD. Lungs:  Resp regular and unlabored, CTA. Heart: RRR no s3, s4, 3/6 SEM RUSB - heard throughout. Abdomen: Soft, non-tender,  non-distended, BS + x 4.  Extremities: No clubbing, cyanosis.  Trace bilat LE edema. DP/PT/Radials 1+ and equal bilaterally.  R wrist cath site w/o bleeding/bruit/hematoma.  Accessory Clinical Findings  CBC  Recent Labs  09/08/12 0630 09/09/12 0410  WBC 6.6 6.2  HGB 11.2* 9.5*  HCT 35.4* 30.1*  MCV 100.6* 101.3*  PLT 135* 124*   Basic Metabolic Panel  Recent Labs  09/08/12 0630 09/09/12 0410  NA 142 139  K 4.9 5.4*  CL 99 98  CO2 28 28  GLUCOSE 104* 91  BUN 48* 62*  CREATININE 6.50* 7.78*  CALCIUM 10.2 9.3   TELE  rsr  ECG  A-paced, 60, poor R prog, twi I, aVL - no acute st/t changes.  Radiology/Studies  No results found.  ASSESSMENT AND PLAN  1.  USA/CAD:  S/p PCI/DES to the LAD yesterday.  No chest pain/sob overnight.  Cont asa, plavix, bb, statin therapy.  Plan d/c today after dialysis.  2. ESRD:  For dialysis this AM.  Appreciate nephrology assistance.  3.  ICM/Chronic systolic CHF:  For dialysis today.  She remains on bb/arb.  Will need to consider discontinuation of ARB if hyperkalemia remains an issue in the future.  4.  HTN:  Stable.  5.  HL:  LDL 33 in 11/2011.  Cont statin therapy.  6.  Hyperkalemia:  Dialysis today.  7.  Moderate Aortic Stenosis:  Cont conservative mgmt.  Signed, Nicolasa Ducking NP Patient seen and examined and history reviewed. Agree with above findings and plan. Patient currently on HD. No chest pain. Complains of pain in right hand. Radial pulse is good without hematoma. Distal perfusion is good. OK for DC today after hemodialysis. Continue ASA and Plavix for 12 months.  Theron Arista [facility] 09/09/2012 9:20 AM

## 2012-09-09 NOTE — Evaluation (Signed)
Physical Therapy One Time Evaluation Patient Details Name: Melissa [name] MRN: [id number] DOB: 1940-02-24 Today's Date: 09/09/2012 Time: 6213-0865 PT Time Calculation (min): 10 min  PT Assessment / Plan / Recommendation History of Present Illness  Pt admitted with unstable angina, s/p cardiac cath.  Hx ESRD  Clinical Impression  Patient evaluated by Physical Therapy with no further acute PT needs identified. All education has been completed and the patient has no further questions.  Pt reports her son will take her home after work today and either he or her daughter will stay with her tonight after d/c as PT recommended supervision upon d/c initially due to weakness and dizziness today with ambulation.  Pt also reports she has a life alert and HH aide to assist her 6 days a week.  See below for any follow-up Physial Therapy or equipment needs. PT is signing off. Thank you for this referral.     PT Assessment  All further PT needs can be met in the next venue of care    Follow Up Recommendations  Home health PT;Supervision for mobility/OOB    Does the patient have the potential to tolerate intense rehabilitation      Barriers to Discharge        Equipment Recommendations  None recommended by PT    Recommendations for Other Services     Frequency      Precautions / Restrictions Precautions Precautions: Fall   Pertinent Vitals/Pain No pain      Mobility  Bed Mobility Bed Mobility: Not assessed Details for Bed Mobility Assistance: up in recliner on arrival Transfers Transfers: Sit to Stand;Stand to Sit Sit to Stand: 4: Min assist;With upper extremity assist;From chair/3-in-1 Stand to Sit: 4: Min guard;With upper extremity assist;To chair/3-in-1 Details for Transfer Assistance: assist for weakness, uses lift chair at home, verbal cues for backing up to recliner Ambulation/Gait Ambulation/Gait Assistance: 4: Min guard Ambulation Distance (Feet): 20 Feet Assistive device:  Rolling walker Ambulation/Gait Assistance Details: pt only able to tolerate ambulating to door and then back to recliner, pt reports dizziness and LE weakness are limiting her distance today Gait Pattern: Step-through pattern;Trunk flexed;Decreased stride length Gait velocity: decreased    Exercises     PT Diagnosis: Generalized weakness  PT Problem List: Decreased balance;Decreased activity tolerance;Decreased strength;Decreased mobility PT Treatment Interventions:       PT Goals(Current goals can be found in the care plan section) Acute Rehab PT Goals PT Goal Formulation: No goals set, d/c therapy  Visit Information  Last PT Received On: 09/09/12 Assistance Needed: +1 History of Present Illness: Pt admitted with unstable angina, s/p cardiac cath.  Hx ESRD       Prior Functioning  Home Living Family/patient expects to be discharged to:: Private residence Living Arrangements: Alone Available Help at Discharge: Personal care attendant;Available PRN/intermittently;Family Type of Home: House Home Access: Ramped entrance Home Layout: One level Home Equipment: Shower seat - built in;Walker - 2 wheels;Walker - 4 wheels;Cane - single point;Toilet riser Additional Comments: pt also has lift chair, HH aide 6 days a week in afternoons, HD TTS, pt reports son or daughter can stay with her upon d/c Prior Function Level of Independence: Independent with assistive device(s) Comments: Pt reports that prior to admission she was driving and able to manage self care independently, but has an aid who comes to the house every afternoon to help with daily chores such as laundry, cooking, cleaning, etc.  Communication Communication: No difficulties    Cognition  Cognition Arousal/Alertness: Awake/alert Behavior During Therapy: WFL for tasks assessed/performed Overall Cognitive Status: Within Functional Limits for tasks assessed    Extremity/Trunk Assessment Lower Extremity Assessment Lower  Extremity Assessment: Generalized weakness   Balance    End of Session PT - End of Session Activity Tolerance: Patient limited by fatigue Patient left: in chair;with call bell/phone within reach Nurse Communication: Mobility status  GP Functional Assessment Tool Used: clinical judgement Functional Limitation: Mobility: Walking and moving around Mobility: Walking and Moving Around Current Status (Z6109): At least 1 percent but less than 20 percent impaired, limited or restricted Mobility: Walking and Moving Around Goal Status [phone]): At least 1 percent but less than 20 percent impaired, limited or restricted Mobility: Walking and Moving Around Discharge Status [phone]): At least 1 percent but less than 20 percent impaired, limited or restricted   Lucee Brissett,KATHrine E 09/09/2012, 2:30 PM Zenovia Jarred, PT, DPT 09/09/2012 Pager: [phone]

## 2012-09-09 NOTE — Procedures (Signed)
I have seen and examined this patient and agree with the plan of care . Patient seen on dialysis. Hopefully home after HD [facility] W 09/09/2012, 7:21 AM

## 2012-09-10 ENCOUNTER — Telehealth: Payer: Self-pay | Admitting: *Deleted

## 2012-09-10 NOTE — Telephone Encounter (Signed)
Received call from [facility] --Physical Therapist with [facility]. (Phone [phone]). She is calling to report medication interaction between Zocor 40 mg daily and Amlodipine 5 mg daily. Will forward information to Dr. Myrtis Ser for review. Will also forward to [facility] in [place] as pt is seen in [place] office.

## 2012-09-10 NOTE — Discharge Summary (Signed)
Patient seen and examined and history reviewed. Agree with above findings and plan. See earlier rounding note.  Melissa [name] 09/10/2012 10:11 AM

## 2012-09-11 NOTE — Telephone Encounter (Signed)
Please contact the patient and see if she can be changed from Zocor 40,  Lipitor 40 mg daily. Please explain to her that there are several reasons to do this. I prefer this medicine for her if it can be arranged.

## 2012-09-12 MED ORDER — ATORVASTATIN CALCIUM 40 MG PO TABS: 40.0000 mg | ORAL_TABLET | Freq: Every day | ORAL | Status: DC

## 2012-09-12 NOTE — Telephone Encounter (Signed)
Patient and Physical Therapist Misty Stanley informed and verbalizes understanding of plan.

## 2012-09-25 ENCOUNTER — Telehealth: Payer: Self-pay | Admitting: Cardiology

## 2012-09-25 NOTE — Telephone Encounter (Signed)
Melissa [name] with [facility] needs ok for continued PT for pt for two more weeks twice a week

## 2012-09-25 NOTE — Telephone Encounter (Signed)
Left stacy in pt's home to call back.

## 2012-09-30 ENCOUNTER — Ambulatory Visit: Payer: Medicare Other | Admitting: Cardiology

## 2012-10-06 ENCOUNTER — Other Ambulatory Visit: Payer: Self-pay | Admitting: Physician Assistant

## 2012-10-06 MED ORDER — CARVEDILOL 12.5 MG PO TABS: 12.5000 mg | ORAL_TABLET | Freq: Two times a day (BID) | ORAL | Status: DC

## 2012-10-10 ENCOUNTER — Ambulatory Visit: Payer: Medicare Other | Admitting: Cardiology

## 2012-10-16 ENCOUNTER — Encounter: Payer: Self-pay | Admitting: Cardiology

## 2012-10-17 ENCOUNTER — Encounter: Payer: Self-pay | Admitting: Cardiology

## 2012-10-17 ENCOUNTER — Ambulatory Visit (INDEPENDENT_AMBULATORY_CARE_PROVIDER_SITE_OTHER): Payer: Medicare Other | Admitting: Cardiology

## 2012-10-17 VITALS — BP 153/64 | HR 64 | Ht 63.0 in | Wt 192.0 lb

## 2012-10-17 DIAGNOSIS — I350 Nonrheumatic aortic (valve) stenosis: Secondary | ICD-10-CM

## 2012-10-17 DIAGNOSIS — I2510 Atherosclerotic heart disease of native coronary artery without angina pectoris: Secondary | ICD-10-CM

## 2012-10-17 DIAGNOSIS — I5022 Chronic systolic (congestive) heart failure: Secondary | ICD-10-CM

## 2012-10-17 DIAGNOSIS — I359 Nonrheumatic aortic valve disorder, unspecified: Secondary | ICD-10-CM

## 2012-10-17 DIAGNOSIS — I509 Heart failure, unspecified: Secondary | ICD-10-CM

## 2012-10-17 NOTE — Progress Notes (Signed)
HPI   Patient is seen today to followup aortic stenosis and coronary artery disease. I saw her last in the office on July 21, 2012. My assessment that day was extensive. I then made multiple phone calls to arrange the next step in her care. Dr. Excell Seltzer was able to help substantially. Catheterization showed that her aortic stenosis is severe but not critical. Her right heart data revealed increased filling pressures. This is treated with dialysis. It was felt that the most prudent approach was to proceed with PCI of the LAD. This was done successfully on September 08, 2012. Since that time she feels well. She has some exertional shortness of breath.  Allergies  Allergen Reactions  . Codeine Nausea And Vomiting    Current Outpatient Prescriptions  Medication Sig Dispense Refill  . amLODipine (NORVASC) 5 MG tablet TAKE (1) TABLET BY MOUTH ONCE DAILY.  30 tablet  3  . aspirin EC 81 MG tablet Take 81 mg by mouth daily.      Marland Kitchen atorvastatin (LIPITOR) 40 MG tablet Take 1 tablet (40 mg total) by mouth daily.  90 tablet  3  . carvedilol (COREG) 12.5 MG tablet Take 1 tablet (12.5 mg total) by mouth 2 (two) times daily with a meal.  60 tablet  3  . cinacalcet (SENSIPAR) 30 MG tablet Take 30 mg by mouth daily.       . clopidogrel (PLAVIX) 75 MG tablet TAKE (1) TABLET BY MOUTH ONCE DAILY.  30 tablet  6  . docusate sodium (COLACE) 100 MG capsule Take 100 mg by mouth daily.      Marland Kitchen doxazosin (CARDURA) 8 MG tablet Take 8 mg by mouth at bedtime.      . hydrALAZINE (APRESOLINE) 50 MG tablet Take 50 mg by mouth daily.       Marland Kitchen HYDROcodone-acetaminophen (NORCO/VICODIN) 5-325 MG per tablet Take 1 tablet by mouth every 6 (six) hours as needed for pain.       Marland Kitchen insulin glargine (LANTUS) 100 UNIT/ML injection Inject 5-10 Units into the skin at bedtime. Per sliding scale.  Patient does not take if blood sugar is 100 or below.      . insulin glulisine (APIDRA) 100 UNIT/ML injection Inject 5-15 Units into the skin 3 (three)  times daily before meals. Per sliding scale      . Multiple Vitamin (DAILY VITAMIN FORMULA PO) Take 3 mg by mouth daily. Folic acid- vitamin b complex - vitamin c - selenium - zinc 3 mg tablet.      . nitroGLYCERIN (NITROSTAT) 0.4 MG SL tablet Place 1 tablet (0.4 mg total) under the tongue every 5 (five) minutes as needed. For chest pain  25 tablet  3  . olmesartan (BENICAR) 40 MG tablet Take 40 mg by mouth daily.        Marland Kitchen omeprazole (PRILOSEC) 20 MG capsule Take 20 mg by mouth daily.      Marland Kitchen oxycodone (OXY-IR) 5 MG capsule Take 5 mg by mouth every 6 (six) hours as needed for pain.      . polyethylene glycol powder (GLYCOLAX/MIRALAX) powder Take 68 g by mouth 3 (three) times a week. Tuesday, Thursday, Sunday      . sevelamer carbonate (RENVELA) 800 MG tablet Take 1,600-3,200 mg by mouth 3 (three) times daily with meals. Takes 4 tablets with each meal and takes 2 tablets with every snack.      . sodium polystyrene (KAYEXALATE) 15 GM/60ML suspension Take 15 g by mouth See admin  instructions. Take on Sundays tuesdays and thursdays only      . traMADol (ULTRAM) 50 MG tablet Take 50 mg by mouth every 6 (six) hours as needed for pain.       No current facility-administered medications for this visit.    History   Social History  . Marital Status: Divorced    Spouse Name: N/A    Number of Children: N/A  . Years of Education: N/A   Occupational History  . Not on file.   Social History Main Topics  . Smoking status: Never Smoker   . Smokeless tobacco: Never Used  . Alcohol Use: No  . Drug Use: No  . Sexual Activity: No   Other Topics Concern  . Not on file   Social History Narrative   Lives in [place] alone.    Divorced and has four children.   Has home health aide 5 days/week    Family History  Problem Relation Age of Onset  . Coronary artery disease Mother     died age 72  . Diabetes type II Mother   . Other Father     died in 07/04/2022 of accident  . Diabetes type II Daughter   .  Hiatal hernia Son     Past Medical History  Diagnosis Date  . Coronary artery disease     a. cath 03/2009.(presentation wtih mild CHF & mild inc Ti): 70% prox/80% mid LAD,50% LCX.Marland KitchenLAD could be approached but with difficulty....medicall therapy recommended; b. NSTEMI 01/31/2010 w/ sepsis and anemia...EF 50%...medical Rx;  c. 08/2012 Cath: LM nl, LAD 70m ISR/20m, LCX 80p, RCA 50ost ISR, PDA/PLA 50, EF 35%; d. 08/2012 PCI of LAD with 3.0x12 Promus Premier DES.  . Diabetes mellitus   . Hyperlipidemia   . Moderate aortic stenosis     mild to moderate...echo.Marland KitchenMarland KitchenSeptember,2006/catheterization valve area 1.9cm square/mod/severe....echo...10/2009/mod/severe....echo....01/31/2010; 08/2012 Moderate by LV gram  . 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 Hypertension   . Anemia   . ESRD (end stage renal disease)     a. on dialysis.  . Carotid artery disease     doppler 03-Jul-2009 /  doppler 05/08/2010 similar, <50% bilateral with some plaque  . Ejection fraction     EF 50%, cardiac catheterization, January, 2012  . Dysrhythmia   . Arthritis   . Second degree Mobitz II AV block     St[employer] Accent DR RF dual-chamber PPM (model PM [id number]; serial # [id number])  . CVA (cerebral vascular accident) 2005/07/03    right side weakness  . Pacemaker   . Ischemic cardiomyopathy     a. 06/2012 Echo: EF 25-30%.    Past Surgical History  Procedure Laterality Date  . Appendectomy    . Oophorectomy    . Toe amputation      left great toe  . Retinal detachment surgery    . Cataract extraction    . Coronary angioplasty    . Cholecystectomy    . Avf      Left Arm  . Revision of arteriovenous goretex graft Left 03/21/2012    Procedure: REVISION OF ARTERIOVENOUS GORETEX GRAFT;  Surgeon: Chuck Hint, MD;  Location: [facility] OR;  Service: Vascular;  Laterality: Left;  Revision of left arm arteriovenous gortex graft; replace venous half of graft  . Pacemaker insertion  12/10/11     SJM Accent DR RF implanted by Dr Johney Frame for mobitz II AV block  . Coronary angioplasty with stent  placement  09/08/2012    LAD    Patient Active Problem List   Diagnosis Date Noted  . End stage renal disease 09/09/2012  . Unstable angina 09/09/2012  . Ischemic cardiomyopathy 09/09/2012  . Dialysis patient   . Cardiomyopathy   . Pacemaker-St.Jude 12/11/2011  . Thrombocytopenia 06/19/2011  . Ejection fraction   . CHF (congestive heart failure) 03/02/2011  . Coronary artery disease   . Hyperlipidemia   . Aortic stenosis   . Dysfunction parathyroid   . Hypertension   . CVA (cerebral vascular accident)   . Chronic kidney disease   . Carotid artery disease   . Edema   . 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 Anemia in chronic kidney disease(285.21) 04/22/2009  . PERSONAL HX TIA & CI W/O RESIDUAL DEFICITS 04/22/2009   Patient denies fever, chills, headache, sweats, rash, change in vision, change in hearing, chest pain, cough, nausea vomiting, urinary symptoms. All other systems are reviewed and are negative.  PHYSICAL EXAM  Patient is overweight. She is oriented to person time and place. Affect is normal. There is no jugulovenous distention. I do not hear her radiated murmur to the neck. Cardiac exam reveals S1 and S2. There is a high-pitched murmur of aortic stenosis. Abdomen is soft. There is no peripheral edema.  Filed Vitals:   10/17/12 0852  BP: 153/64  Pulse: 64  Height: 5\' 3"  (1.6 m)  Weight: 192 lb (87.091 kg)     ASSESSMENT & PLAN

## 2012-10-17 NOTE — Assessment & Plan Note (Signed)
Aortic stenosis was severe in the Cath Lab August 29, 2012. However was not critical. This is to be followed. She is known well to Dr. Excell Seltzer. He feel she is not a candidate for an intervention to her aortic valve at this time.

## 2012-10-17 NOTE — Assessment & Plan Note (Signed)
Coronary disease is stable after drug-eluting stent to the LAD September 08, 2012. There is circumflex disease. It was not approached at that time. No further evaluation of her coronary status at this time.

## 2012-10-17 NOTE — Patient Instructions (Signed)
Continue all current medications. Your physician wants you to follow up in: 6 months.  You will receive a reminder letter in the mail one-two months in advance.  If you don't receive a letter, please call our office to schedule the follow up appointment   

## 2012-10-17 NOTE — Assessment & Plan Note (Signed)
The patient is a volume is controlled with dialysis. No further adjustments in her medications at this time.

## 2012-10-20 ENCOUNTER — Ambulatory Visit (INDEPENDENT_AMBULATORY_CARE_PROVIDER_SITE_OTHER): Payer: Medicare Other | Admitting: Ophthalmology

## 2012-10-20 DIAGNOSIS — E11319 Type 2 diabetes mellitus with unspecified diabetic retinopathy without macular edema: Secondary | ICD-10-CM

## 2012-10-20 DIAGNOSIS — E11359 Type 2 diabetes mellitus with proliferative diabetic retinopathy without macular edema: Secondary | ICD-10-CM

## 2012-10-20 DIAGNOSIS — H35039 Hypertensive retinopathy, unspecified eye: Secondary | ICD-10-CM

## 2012-10-20 DIAGNOSIS — H43819 Vitreous degeneration, unspecified eye: Secondary | ICD-10-CM

## 2012-10-20 DIAGNOSIS — E1139 Type 2 diabetes mellitus with other diabetic ophthalmic complication: Secondary | ICD-10-CM

## 2012-10-20 DIAGNOSIS — I10 Essential (primary) hypertension: Secondary | ICD-10-CM

## 2012-10-21 ENCOUNTER — Ambulatory Visit: Payer: Medicare Other | Admitting: Cardiology

## 2012-10-31 ENCOUNTER — Ambulatory Visit (INDEPENDENT_AMBULATORY_CARE_PROVIDER_SITE_OTHER): Payer: Medicare Other | Admitting: Internal Medicine

## 2012-10-31 ENCOUNTER — Encounter: Payer: Self-pay | Admitting: Internal Medicine

## 2012-10-31 VITALS — BP 130/56 | HR 66 | Ht 63.0 in | Wt 193.0 lb

## 2012-10-31 DIAGNOSIS — I429 Cardiomyopathy, unspecified: Secondary | ICD-10-CM

## 2012-10-31 DIAGNOSIS — I10 Essential (primary) hypertension: Secondary | ICD-10-CM

## 2012-10-31 DIAGNOSIS — Z950 Presence of cardiac pacemaker: Secondary | ICD-10-CM

## 2012-10-31 DIAGNOSIS — I428 Other cardiomyopathies: Secondary | ICD-10-CM

## 2012-10-31 DIAGNOSIS — I441 Atrioventricular block, second degree: Secondary | ICD-10-CM

## 2012-10-31 LAB — PACEMAKER DEVICE OBSERVATION
AL AMPLITUDE: 2.3 mv
AL IMPEDENCE PM: 412.5 Ohm
AL THRESHOLD: 1 v
ATRIAL PACING PM: 23
BAMS-0001: 170 {beats}/min
BAMS-0003: 70 {beats}/min
BATTERY VOLTAGE: 2.9478 v
DEVICE MODEL PM: 7418910
RV LEAD AMPLITUDE: 12 mv
RV LEAD IMPEDENCE PM: 450 Ohm
RV LEAD THRESHOLD: 0.75 v
VENTRICULAR PACING PM: 27

## 2012-10-31 NOTE — Patient Instructions (Addendum)
Continue all current medications. Your physician wants you to follow up in:  1 year.  You will receive a reminder letter in the mail one-two months in advance.  If you don't receive a letter, please call our office to schedule the follow up appointment - [place]  

## 2012-10-31 NOTE — Progress Notes (Signed)
PCP: Donzetta Sprung, MD Primary Cardiologist:  Dr Amparo Bristol is a 72 y.o. female who presents today for routine electrophysiology followup.  Since her last visit, the patient reports doing very well.  Today, she denies symptoms of palpitations, chest pain, shortness of breath,  lower extremity edema, dizziness, presyncope, or syncope.  The patient is otherwise without complaint today.   Past Medical History  Diagnosis Date  . Coronary artery disease     a. cath 03/2009.(presentation wtih mild CHF & mild inc Ti): 70% prox/80% mid LAD,50% LCX.Marland KitchenLAD could be approached but with difficulty....medicall therapy recommended; b. NSTEMI 01/31/2010 w/ sepsis and anemia...EF 50%...medical Rx;  c. 08/2012 Cath: LM nl, LAD 94m ISR/43m, LCX 80p, RCA 50ost ISR, PDA/PLA 50, EF 35%; d. 08/2012 PCI of LAD with 3.0x12 Promus Premier DES.  . Diabetes mellitus   . Hyperlipidemia   . Moderate aortic stenosis     mild to moderate...echo.Marland KitchenMarland KitchenSeptember,2006/catheterization valve area 1.9cm square/mod/severe....echo...10/2009/mod/severe....echo....01/31/2010; 08/2012 Moderate by LV gram  . 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 Hypertension   . Anemia   . ESRD (end stage renal disease)     a. on dialysis.  . Carotid artery disease     doppler 2011 /  doppler 05/08/2010 similar, <50% bilateral with some plaque  . Ejection fraction     EF 50%, cardiac catheterization, January, 2012  . Dysrhythmia   . Arthritis   . Second degree Mobitz II AV block     St. Jude Medical Accent DR RF dual-chamber PPM (model PM [id number]; serial # [id number])  . CVA (cerebral vascular accident) 2007    right side weakness  . Pacemaker   . Ischemic cardiomyopathy     a. 06/2012 Echo: EF 25-30%.   Past Surgical History  Procedure Laterality Date  . Appendectomy    . Oophorectomy    . Toe amputation      left great toe  . Retinal detachment surgery    . Cataract extraction    . Coronary  angioplasty    . Cholecystectomy    . Avf      Left Arm  . Revision of arteriovenous goretex graft Left 03/21/2012    Procedure: REVISION OF ARTERIOVENOUS GORETEX GRAFT;  Surgeon: Chuck Hint, MD;  Location: [facility] OR;  Service: Vascular;  Laterality: Left;  Revision of left arm arteriovenous gortex graft; replace venous half of graft  . Pacemaker insertion  12/10/11    SJM Accent DR RF implanted by Dr Johney Frame for mobitz II AV block  . Coronary angioplasty with stent placement  09/08/2012    LAD    Current Outpatient Prescriptions  Medication Sig Dispense Refill  . amLODipine (NORVASC) 5 MG tablet TAKE (1) TABLET BY MOUTH ONCE DAILY.  30 tablet  3  . aspirin EC 81 MG tablet Take 81 mg by mouth daily.      Marland Kitchen atorvastatin (LIPITOR) 40 MG tablet Take 1 tablet (40 mg total) by mouth daily.  90 tablet  3  . carvedilol (COREG) 12.5 MG tablet Take 1 tablet (12.5 mg total) by mouth 2 (two) times daily with a meal.  60 tablet  3  . cinacalcet (SENSIPAR) 30 MG tablet Take 30 mg by mouth daily.       . clopidogrel (PLAVIX) 75 MG tablet TAKE (1) TABLET BY MOUTH ONCE DAILY.  30 tablet  6  . docusate sodium (COLACE) 100 MG capsule Take 100 mg by  mouth daily.      Marland Kitchen doxazosin (CARDURA) 8 MG tablet Take 8 mg by mouth at bedtime.      . hydrALAZINE (APRESOLINE) 50 MG tablet Take 50 mg by mouth daily.       Marland Kitchen HYDROcodone-acetaminophen (NORCO/VICODIN) 5-325 MG per tablet Take 1 tablet by mouth every 6 (six) hours as needed for pain.       Marland Kitchen insulin glargine (LANTUS) 100 UNIT/ML injection Inject 5-10 Units into the skin at bedtime. Per sliding scale.  Patient does not take if blood sugar is 100 or below.      . insulin glulisine (APIDRA) 100 UNIT/ML injection Inject 5-15 Units into the skin 3 (three) times daily before meals. Per sliding scale      . Multiple Vitamin (DAILY VITAMIN FORMULA PO) Take 3 mg by mouth daily. Folic acid- vitamin b complex - vitamin c - selenium - zinc 3 mg tablet.      .  nitroGLYCERIN (NITROSTAT) 0.4 MG SL tablet Place 1 tablet (0.4 mg total) under the tongue every 5 (five) minutes as needed. For chest pain  25 tablet  3  . nystatin-triamcinolone (MYCOLOG II) cream Apply 1 application topically as needed.       Marland Kitchen olmesartan (BENICAR) 40 MG tablet Take 40 mg by mouth daily.        Marland Kitchen omeprazole (PRILOSEC) 20 MG capsule Take 20 mg by mouth daily.      Marland Kitchen oxycodone (OXY-IR) 5 MG capsule Take 5 mg by mouth every 6 (six) hours as needed for pain.      . polyethylene glycol powder (GLYCOLAX/MIRALAX) powder Take 68 g by mouth 3 (three) times a week. Tuesday, Thursday, Sunday      . sevelamer carbonate (RENVELA) 800 MG tablet Take 1,600-3,200 mg by mouth 3 (three) times daily with meals. Takes 4 tablets with each meal and takes 2 tablets with every snack.      . sodium polystyrene (KAYEXALATE) 15 GM/60ML suspension Take 15 g by mouth See admin instructions. Take on Sundays tuesdays and thursdays only      . traMADol (ULTRAM) 50 MG tablet Take 50 mg by mouth every 6 (six) hours as needed for pain.       No current facility-administered medications for this visit.    Physical Exam: Filed Vitals:   10/31/12 0843  BP: 130/56  Pulse: 66  Height: 5\' 3"  (1.6 m)  Weight: 193 lb (87.544 kg)    GEN- The patient is well appearing, alert and oriented x 3 today.   Head- normocephalic, atraumatic Eyes-  Sclera clear, conjunctiva pink Ears- hearing intact Oropharynx- clear Lungs- Clear to ausculation bilaterally, normal work of breathing Chest- R sided pacemaker pocket is well healed Heart- Regular rate and rhythm, 3/6 SEM LUSB GI- soft, NT, ND, + BS Extremities- no clubbing, cyanosis, or edema  Pacemaker interrogation- reviewed in detail today,  See PACEART report  Assessment and Plan:  1. Mobitz II AV block Normal pacemaker function See [facility] report Will enroll in [facility]  2. HTN Stable No change required today  3 CAD Stable No change required  today  Return in 1 year

## 2012-12-08 ENCOUNTER — Encounter: Payer: Self-pay | Admitting: Internal Medicine

## 2012-12-30 ENCOUNTER — Other Ambulatory Visit: Payer: Self-pay | Admitting: Cardiovascular Disease

## 2013-01-02 ENCOUNTER — Other Ambulatory Visit (HOSPITAL_COMMUNITY): Payer: Self-pay | Admitting: Nephrology

## 2013-01-02 DIAGNOSIS — N186 End stage renal disease: Secondary | ICD-10-CM

## 2013-01-02 DIAGNOSIS — T82590A Other mechanical complication of surgically created arteriovenous fistula, initial encounter: Secondary | ICD-10-CM

## 2013-01-05 ENCOUNTER — Other Ambulatory Visit (HOSPITAL_COMMUNITY): Payer: Self-pay | Admitting: Nephrology

## 2013-01-05 ENCOUNTER — Ambulatory Visit (HOSPITAL_COMMUNITY)
Admission: RE | Admit: 2013-01-05 | Discharge: 2013-01-05 | Disposition: A | Discharge status: Home / Self Care | Payer: Medicare Other | Source: Ambulatory Visit | Attending: Nephrology | Admitting: Nephrology

## 2013-01-05 DIAGNOSIS — T82590A Other mechanical complication of surgically created arteriovenous fistula, initial encounter: Secondary | ICD-10-CM

## 2013-01-05 DIAGNOSIS — N186 End stage renal disease: Secondary | ICD-10-CM | POA: Insufficient documentation

## 2013-01-05 DIAGNOSIS — T82898A Other specified complication of vascular prosthetic devices, implants and grafts, initial encounter: Secondary | ICD-10-CM | POA: Insufficient documentation

## 2013-01-05 DIAGNOSIS — Z992 Dependence on renal dialysis: Secondary | ICD-10-CM | POA: Insufficient documentation

## 2013-01-05 DIAGNOSIS — Y832 Surgical operation with anastomosis, bypass or graft as the cause of abnormal reaction of the patient, or of later complication, without mention of misadventure at the time of the procedure: Secondary | ICD-10-CM | POA: Insufficient documentation

## 2013-01-05 MED ORDER — IOHEXOL 300 MG/ML  SOLN
100.0000 mL | Freq: Once | INTRAMUSCULAR | Status: AC | PRN
  Administered 2013-01-05: 50 mL via INTRAVENOUS

## 2013-01-05 NOTE — Procedures (Signed)
Procedure:  Left arm shuntogram and venous angioplasty Findings:  3 separate areas of tandem stenosis in loop graft treated with 7 mm balloon angioplasty with excellent result. Rest of graft and outflow widely patent.

## 2013-02-03 ENCOUNTER — Encounter: Payer: Self-pay | Admitting: Cardiology

## 2013-02-04 ENCOUNTER — Encounter: Payer: Medicare Other | Admitting: *Deleted

## 2013-02-04 ENCOUNTER — Encounter: Payer: Self-pay | Admitting: Cardiology

## 2013-02-11 ENCOUNTER — Encounter: Payer: Self-pay | Admitting: *Deleted

## 2013-02-19 ENCOUNTER — Other Ambulatory Visit (HOSPITAL_COMMUNITY): Payer: Self-pay | Admitting: Nephrology

## 2013-02-19 DIAGNOSIS — T82868A Thrombosis of vascular prosthetic devices, implants and grafts, initial encounter: Secondary | ICD-10-CM

## 2013-02-20 ENCOUNTER — Inpatient Hospital Stay (HOSPITAL_COMMUNITY): Admission: RE | Admit: 2013-02-20 | Payer: Medicare Other | Source: Ambulatory Visit

## 2013-03-24 ENCOUNTER — Other Ambulatory Visit: Payer: Self-pay | Admitting: Cardiology

## 2013-04-17 ENCOUNTER — Ambulatory Visit (INDEPENDENT_AMBULATORY_CARE_PROVIDER_SITE_OTHER): Payer: Medicare Other | Admitting: Cardiology

## 2013-04-17 ENCOUNTER — Encounter: Payer: Self-pay | Admitting: Cardiology

## 2013-04-17 VITALS — BP 140/90 | HR 83 | Ht 63.5 in | Wt 177.1 lb

## 2013-04-17 DIAGNOSIS — Z950 Presence of cardiac pacemaker: Secondary | ICD-10-CM

## 2013-04-17 DIAGNOSIS — I359 Nonrheumatic aortic valve disorder, unspecified: Secondary | ICD-10-CM

## 2013-04-17 DIAGNOSIS — N189 Chronic kidney disease, unspecified: Secondary | ICD-10-CM

## 2013-04-17 DIAGNOSIS — I428 Other cardiomyopathies: Secondary | ICD-10-CM

## 2013-04-17 DIAGNOSIS — I2510 Atherosclerotic heart disease of native coronary artery without angina pectoris: Secondary | ICD-10-CM

## 2013-04-17 DIAGNOSIS — I429 Cardiomyopathy, unspecified: Secondary | ICD-10-CM

## 2013-04-17 DIAGNOSIS — I350 Nonrheumatic aortic (valve) stenosis: Secondary | ICD-10-CM

## 2013-04-17 NOTE — Assessment & Plan Note (Signed)
Her pacemaker is in place. No change in therapy.

## 2013-04-17 NOTE — Assessment & Plan Note (Signed)
The patient has significant coronary disease. She received a drug-eluting stent to the LAD in August, 2014. She has circumflex disease that was not approached. She's not having chest pain. She is on dual antiplatelet therapy. This does not appear to cause significant problems with her dialysis. I want to continue this for a year.

## 2013-04-17 NOTE — Assessment & Plan Note (Signed)
She has very significant aortic stenosis. However decision was made in August, 2014 to follow this clinically. I've chosen not to order an echo at this time.

## 2013-04-17 NOTE — Progress Notes (Signed)
Patient ID: Melissa [name], female   DOB: Dec 01, 1940, 73 y.o.   MRN: [id number]    HPI  Patient is seen today to followup aortic stenosis and coronary artery disease. She's very complex patient. I saw her last September, 2014. Prior to that I had arranged for very careful cardiac catheterization done by Dr. Excell Seltzer. He felt that her aortic stenosis was severe but not critical. Her right heart filling pressures were high and it was felt this should be treated with dialysis. It was also felt that it was most prudent to proceed with PCI to the LAD. This was done successfully in August, 2014. She is on aspirin and Plavix.  The patient has been hospitalized since I saw her last. I have reviewed the hospital records from January, 2015. At that time it appears that she had both pneumonia and some CHF. She was treated with antibiotics and dialysis and she stabilized. She was sent to a nursing home for 5 weeks and now she is improving. Her walking is limited due to leg weakness not shortness of breath.  Allergies  Allergen Reactions  . Codeine Nausea And Vomiting    Current Outpatient Prescriptions  Medication Sig Dispense Refill  . acetaminophen (TYLENOL) 325 MG tablet Take 650 mg by mouth every 6 (six) hours as needed.      Marland Kitchen amLODipine (NORVASC) 5 MG tablet TAKE 1 TABLET DAILY.  30 tablet  6  . aspirin EC 81 MG tablet Take 81 mg by mouth daily.      Marland Kitchen atorvastatin (LIPITOR) 40 MG tablet Take 1 tablet (40 mg total) by mouth daily.  90 tablet  3  . carvedilol (COREG) 12.5 MG tablet TAKE (1) TABLET TWICE A DAY WITH MEALS.  60 tablet  6  . cinacalcet (SENSIPAR) 30 MG tablet Take 30 mg by mouth daily.       . clopidogrel (PLAVIX) 75 MG tablet TAKE (1) TABLET BY MOUTH ONCE DAILY.  30 tablet  6  . docusate sodium (COLACE) 100 MG capsule Take 100 mg by mouth daily.      Marland Kitchen doxazosin (CARDURA) 8 MG tablet Take 8 mg by mouth at bedtime.      . hydrALAZINE (APRESOLINE) 50 MG tablet Take 50 mg by mouth daily.        Marland Kitchen HYDROcodone-acetaminophen (NORCO/VICODIN) 5-325 MG per tablet Take 1 tablet by mouth every 6 (six) hours as needed for pain.       Marland Kitchen insulin glargine (LANTUS) 100 UNIT/ML injection Inject 5-10 Units into the skin at bedtime. Per sliding scale.  Patient does not take if blood sugar is 100 or below.      . insulin glulisine (APIDRA) 100 UNIT/ML injection Inject 5-15 Units into the skin 3 (three) times daily before meals. Per sliding scale      . Multiple Vitamin (DAILY VITAMIN FORMULA PO) Take 3 mg by mouth daily. Folic acid- vitamin b complex - vitamin c - selenium - zinc 3 mg tablet.      . nitroGLYCERIN (NITROSTAT) 0.4 MG SL tablet Place 1 tablet (0.4 mg total) under the tongue every 5 (five) minutes as needed. For chest pain  25 tablet  3  . nystatin cream (MYCOSTATIN) Apply 1 application topically as needed.      . nystatin-triamcinolone (MYCOLOG II) cream Apply 1 application topically as needed.       Marland Kitchen olmesartan (BENICAR) 40 MG tablet Take 40 mg by mouth daily.        Marland Kitchen  omeprazole (PRILOSEC) 20 MG capsule Take 20 mg by mouth daily.      . polyethylene glycol powder (GLYCOLAX/MIRALAX) powder Take 68 g by mouth 3 (three) times a week. Tuesday, Thursday, Sunday      . sevelamer carbonate (RENVELA) 800 MG tablet Take 1,600-3,200 mg by mouth 3 (three) times daily with meals. Takes 4 tablets with each meal and takes 2 tablets with every snack.      . sodium polystyrene (KAYEXALATE) 15 GM/60ML suspension Take 15 g by mouth See admin instructions. Take on Sundays tuesdays and thursdays only      . traMADol (ULTRAM) 50 MG tablet Take 50 mg by mouth every 6 (six) hours as needed for pain.       No current facility-administered medications for this visit.    History   Social History  . Marital Status: Divorced    Spouse Name: N/A    Number of Children: N/A  . Years of Education: N/A   Occupational History  . Not on file.   Social History Main Topics  . Smoking status: Never Smoker   .  Smokeless tobacco: Never Used  . Alcohol Use: No  . Drug Use: No  . Sexual Activity: No   Other Topics Concern  . Not on file   Social History Narrative   Lives in [place] alone.    Divorced and has four children.   Has home health aide 5 days/week    Family History  Problem Relation Age of Onset  . Coronary artery disease Mother     died age 57  . Diabetes type II Mother   . Other Father     died in 05/30/22 of accident  . Diabetes type II Daughter   . Hiatal hernia Son     Past Medical History  Diagnosis Date  . Coronary artery disease     a. cath 03/2009.(presentation wtih mild CHF & mild inc Ti): 70% prox/80% mid LAD,50% LCX.Marland KitchenLAD could be approached but with difficulty....medicall therapy recommended; b. NSTEMI 01/31/2010 w/ sepsis and anemia...EF 50%...medical Rx;  c. 08/2012 Cath: LM nl, LAD 34m ISR/61m, LCX 80p, RCA 50ost ISR, PDA/PLA 50, EF 35%; d. 08/2012 PCI of LAD with 3.0x12 Promus Premier DES.  . Diabetes mellitus   . Hyperlipidemia   . Moderate aortic stenosis     mild to moderate...echo.Marland KitchenMarland KitchenSeptember,2006/catheterization valve area 1.9cm square/mod/severe....echo...10/2009/mod/severe....echo....01/31/2010; 08/2012 Moderate by LV gram  . 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 Hypertension   . Anemia   . ESRD (end stage renal disease)     a. on dialysis.  . Carotid artery disease     doppler 05/29/2009 /  doppler 05/08/2010 similar, <50% bilateral with some plaque  . Ejection fraction     EF 50%, cardiac catheterization, January, 2012  . Dysrhythmia   . Arthritis   . Second degree Mobitz II AV block     St. Jude Medical Accent DR RF dual-chamber PPM (model PM [id number]; serial # [id number])  . CVA (cerebral vascular accident) 05/29/05    right side weakness  . Pacemaker   . Ischemic cardiomyopathy     a. 06/2012 Echo: EF 25-30%.    Past Surgical History  Procedure Laterality Date  . Appendectomy    . Oophorectomy    . Toe amputation       left great toe  . Retinal detachment surgery    . Cataract extraction    . Coronary angioplasty    .  Cholecystectomy    . Avf      Left Arm  . Revision of arteriovenous goretex graft Left 03/21/2012    Procedure: REVISION OF ARTERIOVENOUS GORETEX GRAFT;  Surgeon: Chuck Hinthristopher S Dickson, MD;  Location: [facility] OR;  Service: Vascular;  Laterality: Left;  Revision of left arm arteriovenous gortex graft; replace venous half of graft  . Pacemaker insertion  12/10/11    SJM Accent DR RF implanted by Dr Johney FrameAllred for mobitz II AV block  . Coronary angioplasty with stent placement  09/08/2012    LAD    Patient Active Problem List   Diagnosis Date Noted  . Second degree Mobitz II AV block 10/31/2012  . Chronic systolic CHF (congestive heart failure) 10/17/2012  . End stage renal disease 09/09/2012  . Unstable angina 09/09/2012  . Ischemic cardiomyopathy 09/09/2012  . Dialysis patient   . Cardiomyopathy   . Pacemaker-St.Jude 12/11/2011  . Thrombocytopenia 06/19/2011  . Ejection fraction   . Coronary artery disease   . Hyperlipidemia   . Aortic stenosis   . Dysfunction parathyroid   . Hypertension   . CVA (cerebral vascular accident)   . Chronic kidney disease   . Carotid artery disease   . Edema   . 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 Anemia in chronic kidney disease(285.21) 04/22/2009  . PERSONAL HX TIA & CI W/O RESIDUAL DEFICITS 04/22/2009    ROS   Patient denies fever, chills, headache, sweats, rash, change in vision, change in hearing, chest pain, cough, nausea or vomiting, urinary symptoms. All other systems are reviewed and are negative.  PHYSICAL EXAM   Patient is oriented to person time and place. Affect is normal. She walks with a walker. There is no jugulovenous distention. She is overweight. Lungs reveal scattered rhonchi. There is no respiratory distress. Cardiac exam reveals S1 and S2. There is a murmur of aortic stenosis. The second heart sound is  heard. The abdomen is soft. There is no significant peripheral edema. There no musculoskeletal deformities. There are no skin rashes.  Filed Vitals:   04/17/13 1322  BP: 140/90  Pulse: 83  Height: 5' 3.5" (1.613 m)  Weight: 177 lb 1.9 oz (80.341 kg)  SpO2: 97%     ASSESSMENT & PLAN

## 2013-04-17 NOTE — Assessment & Plan Note (Signed)
The patient is on regular dialysis. No change in therapy.

## 2013-04-17 NOTE — Patient Instructions (Signed)

## 2013-04-17 NOTE — Assessment & Plan Note (Addendum)
The patient's ejection fraction is reduced as of June, 2014. She is on the only medication she can tolerate for this. No change in therapy.  As part of today's evaluation I spent greater than 25 minutes with the patient's total care. She is quite complex. We have reviewed the issues of her coronary disease and her aortic valve disease in her dialysis. No further workup is recommended at this time.

## 2013-04-25 ENCOUNTER — Other Ambulatory Visit: Payer: Self-pay | Admitting: Cardiology

## 2013-05-08 ENCOUNTER — Other Ambulatory Visit: Payer: Self-pay

## 2013-05-08 MED ORDER — CLOPIDOGREL BISULFATE 75 MG PO TABS: ORAL_TABLET | ORAL | Status: DC

## 2013-06-30 ENCOUNTER — Encounter: Payer: Self-pay | Admitting: Cardiology

## 2013-06-30 ENCOUNTER — Other Ambulatory Visit: Payer: Self-pay | Admitting: Cardiology

## 2013-07-20 ENCOUNTER — Encounter: Payer: Self-pay | Admitting: Internal Medicine

## 2013-07-20 ENCOUNTER — Ambulatory Visit (INDEPENDENT_AMBULATORY_CARE_PROVIDER_SITE_OTHER): Payer: Medicare Other | Admitting: *Deleted

## 2013-07-20 DIAGNOSIS — I441 Atrioventricular block, second degree: Secondary | ICD-10-CM

## 2013-07-20 NOTE — Progress Notes (Signed)
Remote pacemaker transmission.   

## 2013-07-27 ENCOUNTER — Encounter: Payer: Self-pay | Admitting: *Deleted

## 2013-07-27 LAB — MDC_IDC_ENUM_SESS_TYPE_REMOTE
Battery Remaining Percentage: 71 %
Battery Voltage: 2.95 V
Brady Statistic AP VP Percent: 31 %
Brady Statistic AS VP Percent: 14 %
Brady Statistic RA Percent Paced: 36 %
Date Time Interrogation Session: 20150622175722
Implantable Pulse Generator Model: 2210
Implantable Pulse Generator Serial Number: 7418910
Lead Channel Impedance Value: 390 Ohm
Lead Channel Impedance Value: 400 Ohm
Lead Channel Pacing Threshold Amplitude: 1 V
Lead Channel Sensing Intrinsic Amplitude: 1.9 mV
Lead Channel Setting Sensing Sensitivity: 4 mV
MDC IDC MSMT BATTERY REMAINING LONGEVITY: 77 mo
MDC IDC MSMT LEADCHNL RA PACING THRESHOLD PULSEWIDTH: 0.5 ms
MDC IDC MSMT LEADCHNL RV PACING THRESHOLD AMPLITUDE: 0.75 V
MDC IDC MSMT LEADCHNL RV PACING THRESHOLD PULSEWIDTH: 0.5 ms
MDC IDC MSMT LEADCHNL RV SENSING INTR AMPL: 12 mV
MDC IDC SET LEADCHNL RA PACING AMPLITUDE: 2 V
MDC IDC SET LEADCHNL RV PACING AMPLITUDE: 2.5 V
MDC IDC SET LEADCHNL RV PACING PULSEWIDTH: 0.5 ms
MDC IDC STAT BRADY AP VS PERCENT: 18 %
MDC IDC STAT BRADY AS VS PERCENT: 25 %
MDC IDC STAT BRADY RV PERCENT PACED: 46 %

## 2013-07-29 ENCOUNTER — Encounter: Payer: Self-pay | Admitting: Cardiology

## 2013-07-31 ENCOUNTER — Other Ambulatory Visit: Payer: Self-pay | Admitting: Cardiology

## 2013-08-13 ENCOUNTER — Other Ambulatory Visit: Payer: Self-pay

## 2013-08-18 ENCOUNTER — Encounter (HOSPITAL_COMMUNITY): Payer: Self-pay | Admitting: Pharmacy Technician

## 2013-08-24 ENCOUNTER — Encounter (HOSPITAL_COMMUNITY)
Admission: RE | Disposition: A | Discharge status: Home / Self Care | Payer: Self-pay | Source: Ambulatory Visit | Attending: Vascular Surgery

## 2013-08-24 ENCOUNTER — Ambulatory Visit (HOSPITAL_COMMUNITY)
Admission: RE | Admit: 2013-08-24 | Discharge: 2013-08-24 | Disposition: A | Discharge status: Home / Self Care | Payer: Medicare Other | Source: Ambulatory Visit | Attending: Vascular Surgery | Admitting: Vascular Surgery

## 2013-08-24 DIAGNOSIS — I509 Heart failure, unspecified: Secondary | ICD-10-CM | POA: Diagnosis not present

## 2013-08-24 DIAGNOSIS — Z992 Dependence on renal dialysis: Secondary | ICD-10-CM | POA: Diagnosis not present

## 2013-08-24 DIAGNOSIS — I2510 Atherosclerotic heart disease of native coronary artery without angina pectoris: Secondary | ICD-10-CM | POA: Insufficient documentation

## 2013-08-24 DIAGNOSIS — T82898A Other specified complication of vascular prosthetic devices, implants and grafts, initial encounter: Secondary | ICD-10-CM

## 2013-08-24 DIAGNOSIS — I69998 Other sequelae following unspecified cerebrovascular disease: Secondary | ICD-10-CM | POA: Insufficient documentation

## 2013-08-24 DIAGNOSIS — I2589 Other forms of chronic ischemic heart disease: Secondary | ICD-10-CM | POA: Insufficient documentation

## 2013-08-24 DIAGNOSIS — N186 End stage renal disease: Secondary | ICD-10-CM | POA: Insufficient documentation

## 2013-08-24 DIAGNOSIS — I120 Hypertensive chronic kidney disease with stage 5 chronic kidney disease or end stage renal disease: Secondary | ICD-10-CM | POA: Diagnosis not present

## 2013-08-24 DIAGNOSIS — I6529 Occlusion and stenosis of unspecified carotid artery: Secondary | ICD-10-CM | POA: Diagnosis not present

## 2013-08-24 DIAGNOSIS — Z950 Presence of cardiac pacemaker: Secondary | ICD-10-CM | POA: Diagnosis not present

## 2013-08-24 DIAGNOSIS — R29898 Other symptoms and signs involving the musculoskeletal system: Secondary | ICD-10-CM | POA: Diagnosis not present

## 2013-08-24 DIAGNOSIS — E119 Type 2 diabetes mellitus without complications: Secondary | ICD-10-CM | POA: Insufficient documentation

## 2013-08-24 DIAGNOSIS — I441 Atrioventricular block, second degree: Secondary | ICD-10-CM | POA: Diagnosis not present

## 2013-08-24 HISTORY — PX: FISTULOGRAM: SHX5832

## 2013-08-24 LAB — GLUCOSE, CAPILLARY
GLUCOSE-CAPILLARY: 88 mg/dL (ref 70–99)
GLUCOSE-CAPILLARY: 78 mg/dL (ref 70–99)

## 2013-08-24 SURGERY — FISTULOGRAM
Anesthesia: LOCAL | Laterality: Left

## 2013-08-24 MED ORDER — LIDOCAINE HCL (PF) 1 % IJ SOLN
INTRAMUSCULAR | Status: AC
  Filled 2013-08-24: qty 30

## 2013-08-24 MED ORDER — HEPARIN (PORCINE) IN NACL 2-0.9 UNIT/ML-% IJ SOLN
INTRAMUSCULAR | Status: AC
  Filled 2013-08-24: qty 500

## 2013-08-24 MED ORDER — FENTANYL CITRATE 0.05 MG/ML IJ SOLN
INTRAMUSCULAR | Status: AC
  Filled 2013-08-24: qty 2

## 2013-08-24 MED ORDER — MIDAZOLAM HCL 2 MG/2ML IJ SOLN
INTRAMUSCULAR | Status: AC
  Filled 2013-08-24: qty 2

## 2013-08-24 MED ORDER — SODIUM CHLORIDE 0.9 % IJ SOLN: 3.0000 mL | INTRAMUSCULAR | Status: DC | PRN

## 2013-08-24 NOTE — Discharge Instructions (Signed)
Fistulogram, Care After °Refer to this sheet in the next few weeks. These instructions provide you with information on caring for yourself after your procedure. Your health care provider may also give you more specific instructions. Your treatment has been planned according to current medical practices, but problems sometimes occur. Call your health care provider if you have any problems or questions after your procedure. °WHAT TO EXPECT AFTER THE PROCEDURE °After your procedure, it is typical to have the following: °· A small amount of discomfort in the area where the catheters were placed. °· A small amount of bruising around the fistula. °· Sleepiness and fatigue. °HOME CARE INSTRUCTIONS °· Rest at home for the day following your procedure. °· Do not drive or operate heavy machinery while taking pain medicine. °· Take medicines only as directed by your health care provider. °· Do not take baths, swim, or use a hot tub until your health care provider approves. You may shower 24 hours after the procedure or as directed by your health care provider. °· There are many different ways to close and cover an incision, including stitches, skin glue, and adhesive strips. Follow your health care provider's instructions on: °¨ Incision care. °¨ Bandage (dressing) changes and removal. °¨ Incision closure removal. °· Monitor your dialysis fistula carefully. °SEEK MEDICAL CARE IF: °· You have drainage, redness, swelling, or pain at your catheter site. °· You have a fever. °· You have chills. °SEEK IMMEDIATE MEDICAL CARE IF: °· You feel weak. °· You have trouble balancing. °· You have trouble moving your arms or legs. °· You have problems with your speech or vision. °· You can no longer feel a vibration or buzz when you put your fingers over your dialysis fistula. °· The limb that was used for the procedure: °¨ Swells. °¨ Is painful. °¨ Is cold. °¨ Is discolored, such as blue or pale white. °Document Released: 06/01/2013  Document Reviewed: 03/06/2013 °ExitCare® Patient Information ©2015 [employer]. This information is not intended to replace advice given to you by your health care provider. Make sure you discuss any questions you have with your health care provider. ° °

## 2013-08-24 NOTE — H&P (Signed)
[facility]  Patient name: Melissa [name] MRN: [id number] DOB: [birth year] Sex: female  REASON FOR CONSULT: evaluate left AV graft  HPI: Melissa [name] is a 73 y.o. female who was sent over from the dialysis center in [place] for a fistulogram. She is not sure what what the issue is with the graft however I suspect that she has had problems with poor flow rates. She has no pain or paresthesias in her left arm. She denies any recent uremic symptoms.  Past Medical History  Diagnosis Date  . Coronary artery disease     a. cath 03/2009.(presentation wtih mild CHF & mild inc Ti): 70% prox/80% mid LAD,50% LCX.Marland KitchenLAD could be approached but with difficulty....medicall therapy recommended; b. NSTEMI 01/31/2010 w/ sepsis and anemia...EF 50%...medical Rx;  c. 08/2012 Cath: LM nl, LAD 103m ISR/17m, LCX 80p, RCA 50ost ISR, PDA/PLA 50, EF 35%; d. 08/2012 PCI of LAD with 3.0x12 Promus Premier DES.  . Diabetes mellitus   . Hyperlipidemia   . Moderate aortic stenosis     mild to moderate...echo.Marland KitchenMarland KitchenSeptember,2006/catheterization valve area 1.9cm square/mod/severe....echo...10/2009/mod/severe....echo....01/31/2010; 08/2012 Moderate by LV gram  . 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 Hypertension   . Anemia   . ESRD (end stage renal disease)     a. on dialysis.  . Carotid artery disease     doppler 05-21-09 /  doppler 05/08/2010 similar, <50% bilateral with some plaque  . Ejection fraction     EF 50%, cardiac catheterization, January, 2012  . Arthritis   . Second degree Mobitz II AV block     St. Jude Medical Accent DR RF dual-chamber PPM (model PM [id number]; serial # [id number])  . CVA (cerebral vascular accident) May 21, 2005    right side weakness  . Ischemic cardiomyopathy     a. 06/2012 Echo: EF 25-30%.   Family History  Problem Relation Age of Onset  . Coronary artery disease Mother     died age 33  . Diabetes type II Mother   . Other Father     died  in 2022-05-22 of accident  . Diabetes type II Daughter   . Hiatal hernia Son    SOCIAL HISTORY: History  Substance Use Topics  . Smoking status: Never Smoker   . Smokeless tobacco: Never Used  . Alcohol Use: No   Allergies  Allergen Reactions  . Codeine Nausea And Vomiting   No current facility-administered medications for this encounter.   REVIEW OF SYSTEMS: Arly.Keller ] denotes positive finding; [  ] denotes negative finding CARDIOVASCULAR:  [ ]  chest pain   [ ]  chest pressure   [ ]  palpitations   [ ]  orthopnea   Arly.Keller ] dyspnea on exertion   [ ]  claudication   [ ]  rest pain   [ ]  DVT   [ ]  phlebitis PULMONARY:   [ ]  productive cough   [ ]  asthma   [ ]  wheezing NEUROLOGIC:   [ ]  weakness  [ ]  paresthesias  [ ]  aphasia  [ ]  amaurosis  [ ]  dizziness HEMATOLOGIC:   [ ]  bleeding problems   [ ]  clotting disorders MUSCULOSKELETAL:  [ ]  joint pain   [ ]  joint swelling [ ]  leg swelling GASTROINTESTINAL: [ ]   blood in stool  [ ]   hematemesis GENITOURINARY:  [ ]   dysuria  [ ]   hematuria PSYCHIATRIC:  [ ]  history of major depression INTEGUMENTARY:  [ ]  rashes  [ ]   ulcers CONSTITUTIONAL:  [ ]  fever   [ ]  chills  PHYSICAL EXAM: Filed Vitals:   08/24/13 0724 08/24/13 0726  BP:  125/65  Pulse: 63   Temp: 97.9 F (36.6 C)   TempSrc: Oral   Resp: 20   Height: 5\' 3"  (1.6 m)   Weight: 177 lb 8 oz (80.513 kg)   SpO2: 100%    Body mass index is 31.45 kg/(m^2). GENERAL: The patient is a well-nourished female, in no acute distress. The vital signs are documented above. CARDIOVASCULAR: There is a regular rate and rhythm.  PULMONARY: There is good air exchange bilaterally without wheezing or rales. ABDOMEN: Soft and non-tender with normal pitched bowel sounds.  MUSCULOSKELETAL: There are no major deformities or cyanosis. NEUROLOGIC: No focal weakness or paresthesias are detected. SKIN: There are no ulcers or rashes noted. PSYCHIATRIC: The patient has a normal affect. Her left AV graft has a good thrill  and bruit. The venous aspect of the graft was along the ulnar aspect of the forearm.   DATA:  Lab Results  Component Value Date   WBC 6.2 09/09/2012   HGB 9.4* 09/09/2012   HCT 30.1* 09/09/2012   MCV 100.7* 09/09/2012   PLT 130* 09/09/2012   Lab Results  Component Value Date   NA 139 09/09/2012   K 5.6* 09/09/2012   CL 100 09/09/2012   CO2 27 09/09/2012   Lab Results  Component Value Date   CREATININE 8.00* 09/09/2012   Lab Results  Component Value Date   INR 0.97 09/08/2012   INR 1.11 12/08/2011   INR 1.05 06/16/2011   Lab Results  Component Value Date   HGBA1C 5.4 12/08/2011   CBG (last 3)   Recent Labs  08/24/13 0722  GLUCAP 88   MEDICAL ISSUES: END-STAGE RENAL DISEASE: We will plan to proceed with a fistulogram to evaluate the left AV graft. The procedure and risks have been discussed with the patient.  Zackarie Chason S [facility]: [phone]

## 2013-08-24 NOTE — Op Note (Signed)
   PATIENT: Melissa [name]   MRN: [id number] DOB: [birth year]    DATE OF PROCEDURE: 08/24/2013  INDICATIONS: Melissa [name] is a 73 y.o. female who was set up by her dialysis center for a fistulogram.  PROCEDURE:  1. Ultrasound-guided access to the left AV graft 2. fistulogram left forearm AV graft  SURGEON: Di Kindle. Edilia Bo, MD, FACS  ANESTHESIA: local with sedation   EBL: minimal  TECHNIQUE: Patient was taken to the operating room and sedated with a half a milligram of Versed and 25 mcg of fentanyl. Under ultrasound guidance, after the skin was anesthetized, the distal loop of the graft was cannulated on the venous side with a micropuncture needle and a micropuncture sheath was introduced over the wire. Fistulogram was then obtained to evaluate the fistula from the site of cannulation to the central veins. A blood pressure cuff was then inflated to suprasystolic pressures and fistulogram was obtained allowing reflux into the arterial limb of the graft and allowing visualization of the arterial anastomosis. At the completion of the procedure, a 4-0 Monocryl was used to close the cannulation site. There was good hemostasis. No immediate complications were noted.  FINDINGS:   1. There is no central venous stenosis. 2. The graft is widely patent. There is some mild narrowing just beyond the stent in the venous limb the graft just below the antecubital level. There is no significant stenosis on the arterial limb of the graft and the arterial anastomosis is patent.  Melissa Ferrari, MD, FACS [facility]  DATE OF DICTATION:   08/24/2013

## 2013-08-26 LAB — POCT I-STAT, CHEM 8
BUN: 30 mg/dL — AB (ref 6–23)
CALCIUM ION: 1.12 mmol/L — AB (ref 1.13–1.30)
CHLORIDE: 99 meq/L (ref 96–112)
Creatinine, Ser: 4.9 mg/dL — ABNORMAL HIGH (ref 0.50–1.10)
Glucose, Bld: 78 mg/dL (ref 70–99)
HCT: 39 % (ref 36.0–46.0)
Hemoglobin: 13.3 g/dL (ref 12.0–15.0)
Potassium: 3 mEq/L — ABNORMAL LOW (ref 3.7–5.3)
Sodium: 140 mEq/L (ref 137–147)
TCO2: 30 mmol/L (ref 0–100)

## 2013-09-18 ENCOUNTER — Other Ambulatory Visit: Payer: Self-pay | Admitting: *Deleted

## 2013-09-18 DIAGNOSIS — I739 Peripheral vascular disease, unspecified: Secondary | ICD-10-CM

## 2013-09-29 ENCOUNTER — Other Ambulatory Visit: Payer: Self-pay | Admitting: Cardiology

## 2013-10-08 ENCOUNTER — Other Ambulatory Visit: Payer: Self-pay | Admitting: Cardiovascular Disease

## 2013-10-09 ENCOUNTER — Ambulatory Visit: Payer: Medicare Other | Admitting: Cardiology

## 2013-10-12 ENCOUNTER — Encounter: Payer: Self-pay | Admitting: Vascular Surgery

## 2013-10-13 ENCOUNTER — Encounter: Payer: Self-pay | Admitting: Vascular Surgery

## 2013-10-13 ENCOUNTER — Ambulatory Visit (HOSPITAL_COMMUNITY)
Admission: RE | Admit: 2013-10-13 | Discharge: 2013-10-13 | Disposition: A | Discharge status: Home / Self Care | Payer: Medicare Other | Source: Ambulatory Visit | Attending: Vascular Surgery | Admitting: Vascular Surgery

## 2013-10-13 ENCOUNTER — Encounter (HOSPITAL_COMMUNITY): Payer: Self-pay | Admitting: Pharmacy Technician

## 2013-10-13 ENCOUNTER — Ambulatory Visit (INDEPENDENT_AMBULATORY_CARE_PROVIDER_SITE_OTHER): Payer: Medicare Other | Admitting: Vascular Surgery

## 2013-10-13 ENCOUNTER — Other Ambulatory Visit: Payer: Self-pay

## 2013-10-13 VITALS — BP 128/56 | HR 60 | Resp 16 | Ht 63.0 in | Wt 174.0 lb

## 2013-10-13 DIAGNOSIS — I998 Other disorder of circulatory system: Secondary | ICD-10-CM | POA: Insufficient documentation

## 2013-10-13 DIAGNOSIS — I739 Peripheral vascular disease, unspecified: Secondary | ICD-10-CM

## 2013-10-13 DIAGNOSIS — L98499 Non-pressure chronic ulcer of skin of other sites with unspecified severity: Secondary | ICD-10-CM

## 2013-10-13 DIAGNOSIS — I7025 Atherosclerosis of native arteries of other extremities with ulceration: Secondary | ICD-10-CM | POA: Insufficient documentation

## 2013-10-13 DIAGNOSIS — I999 Unspecified disorder of circulatory system: Secondary | ICD-10-CM | POA: Diagnosis not present

## 2013-10-13 NOTE — Progress Notes (Signed)
Patient name: Melissa [name] MRN: [id number] DOB: November 23, 1940 Sex: female   Referred by: Tanda Rockers  Reason for referral:  Chief Complaint  Patient presents with  . New Evaluation    Ref. by Dr. Tanda Rockers.  C/O Left Great Toe ulcer, 2 mo.  Left 2nd toe ulcer, 3 days.  [facility] - Mondays, Daughter changes dressing  Wed-Fri-Sun.  . Ischemia    Left foot/Toes    HISTORY OF PRESENT ILLNESS: Patient returns today for evaluation of left total wound. She is very pleasant 73 year old female here today with her son. She has end-stage renal disease and has dialysis on Tuesday Thursday and Saturday. She is known to our practice from prior AV access. She does have a history of prior left great amputation many years ago. She apparently had femoral-popliteal bypass prior to this at [facility] in [facility]. This toe amputation and healed for many years. She recently developed a new ulceration over this area. We are seeing her today for further evaluation. She had an outpatient noninvasive Doppler study but more at hospital labs were reviewed. She has a calcified distal vessels therefore a) are not reliable route she does have a significant history of coronary disease  Past Medical History  Diagnosis Date  . Coronary artery disease     a. cath 03/2009.(presentation wtih mild CHF & mild inc Ti): 70% prox/80% mid LAD,50% LCX.Marland KitchenLAD could be approached but with difficulty....medicall therapy recommended; b. NSTEMI 01/31/2010 w/ sepsis and anemia...EF 50%...medical Rx;  c. 08/2012 Cath: LM nl, LAD 82m ISR/5m, LCX 80p, RCA 50ost ISR, PDA/PLA 50, EF 35%; d. 08/2012 PCI of LAD with 3.0x12 Promus Premier DES.  . Diabetes mellitus   . Hyperlipidemia   . Moderate aortic stenosis     mild to moderate...echo.Marland KitchenMarland KitchenSeptember,2006/catheterization valve area 1.9cm square/mod/severe....echo...10/2009/mod/severe....echo....01/31/2010; 08/2012 Moderate by LV gram  . 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 Hypertension   . Anemia   . ESRD (end stage renal disease)     a. on dialysis.  . Carotid artery disease     doppler 2011 /  doppler 05/08/2010 similar, <50% bilateral with some plaque  . Ejection fraction     EF 50%, cardiac catheterization, January, 2012  . Arthritis   . Second degree Mobitz II AV block     St. Jude Medical Accent DR RF dual-chamber PPM (model PM [id number]; serial # [id number])  . CVA (cerebral vascular accident) 2007    right side weakness  . Ischemic cardiomyopathy     a. 06/2012 Echo: EF 25-30%.  . Myocardial infarction     Heart Attack X 4  . Atrial fibrillation   . CHF (congestive heart failure)     Past Surgical History  Procedure Laterality Date  . Appendectomy    . Oophorectomy    . Toe amputation      left great toe  . Retinal detachment surgery    . Cataract extraction    . Coronary angioplasty    . Cholecystectomy    . Avf      Left Arm  . Revision of arteriovenous goretex graft Left 03/21/2012    Procedure: REVISION OF ARTERIOVENOUS GORETEX GRAFT;  Surgeon: Chuck Hint, MD;  Location: [facility] OR;  Service: Vascular;  Laterality: Left;  Revision of left arm arteriovenous gortex graft; replace venous half of graft  . Pacemaker insertion  12/10/11    SJM Accent DR RF implanted by Dr Johney Frame for  mobitz II AV block  . Coronary angioplasty with stent placement  09/08/2012    LAD    History   Social History  . Marital Status: Divorced    Spouse Name: N/A    Number of Children: N/A  . Years of Education: N/A   Occupational History  . Not on file.   Social History Main Topics  . Smoking status: Never Smoker   . Smokeless tobacco: Never Used  . Alcohol Use: No  . Drug Use: No  . Sexual Activity: No   Other Topics Concern  . Not on file   Social History Narrative   Lives in [place] alone.    Divorced and has four children.   Has home health aide 5 days/week    Family History  Problem  Relation Age of Onset  . Coronary artery disease Mother     died age 63  . Diabetes type II Mother   . Diabetes Mother   . Hypertension Mother   . Other Father     died in 06-10-2022 of accident  . Diabetes type II Daughter   . Diabetes Daughter   . Hiatal hernia Son     Allergies as of 10/13/2013 - Review Complete 10/13/2013  Allergen Reaction Noted  . Codeine Nausea And Vomiting 06/16/2011    Current Outpatient Prescriptions on File Prior to Visit  Medication Sig Dispense Refill  . acetaminophen (TYLENOL) 325 MG tablet Take 650 mg by mouth every 6 (six) hours as needed.      Marland Kitchen amLODipine (NORVASC) 5 MG tablet TAKE 1 TABLET ONCE DAILY.  30 tablet  6  . aspirin EC 81 MG tablet Take 81 mg by mouth daily.      Marland Kitchen atorvastatin (LIPITOR) 40 MG tablet TAKE (1) TABLET BY MOUTH ONCE DAILY.  30 tablet  6  . carvedilol (COREG) 12.5 MG tablet Take 12.5 mg by mouth 2 (two) times daily with a meal.      . cinacalcet (SENSIPAR) 30 MG tablet Take 30 mg by mouth daily.       . clopidogrel (PLAVIX) 75 MG tablet Take 75 mg by mouth daily.      Marland Kitchen docusate sodium (COLACE) 100 MG capsule Take 100 mg by mouth daily.      Marland Kitchen doxazosin (CARDURA) 8 MG tablet Take 8 mg by mouth at bedtime.      . hydrALAZINE (APRESOLINE) 50 MG tablet Take 50 mg by mouth daily.       . insulin glargine (LANTUS) 100 UNIT/ML injection Inject 5-10 Units into the skin at bedtime. Per sliding scale. If blood sugar is >150 patient takes 10 Units. Patient does not take if blood sugar is 100 or below.      . insulin glulisine (APIDRA) 100 UNIT/ML injection Inject 5-15 Units into the skin 3 (three) times daily before meals. Per sliding scale      . Multiple Vitamin (DAILY VITAMIN FORMULA PO) Take 1 tablet by mouth daily. Folic acid- vitamin b complex - vitamin c - selenium - zinc 3 mg tablet.      . nitroGLYCERIN (NITROSTAT) 0.4 MG SL tablet Place 0.4 mg under the tongue every 5 (five) minutes as needed for chest pain.      Marland Kitchen nystatin cream  (MYCOSTATIN) Apply 1 application topically 2 (two) times daily as needed (itching).       Marland Kitchen olmesartan (BENICAR) 40 MG tablet Take 40 mg by mouth daily.        Marland Kitchen  omeprazole (PRILOSEC) 20 MG capsule Take 20 mg by mouth daily.      . polyethylene glycol powder (GLYCOLAX/MIRALAX) powder Take 68 g by mouth 3 (three) times a week. Tuesday, Thursday, Sunday      . sevelamer carbonate (RENVELA) 800 MG tablet Take 800 mg by mouth 3 (three) times daily with meals.       . sodium polystyrene (KAYEXALATE) 15 GM/60ML suspension Take 15 g by mouth See admin instructions. Take on Saturdays and Sundays      . amLODipine (NORVASC) 10 MG tablet Take 10 mg by mouth daily.      Marland Kitchen atorvastatin (LIPITOR) 40 MG tablet Take 40 mg by mouth daily.       No current facility-administered medications on file prior to visit.     REVIEW OF SYSTEMS:  Positives indicated with an "X"  CARDIOVASCULAR:   chest pain    chest pressure    palpitations   [x ] orthopnea   [x ] dyspnea on exertion    claudication    rest pain    DVT    phlebitis PULMONARY:    productive cough    asthma    wheezing NEUROLOGIC:   [x ] weakness  [x ] paresthesias   aphasia   amaurosis   dizziness HEMATOLOGIC:    bleeding problems    clotting disorders MUSCULOSKELETAL:   joint pain    joint swelling GASTROINTESTINAL:   blood in stool    hematemesis GENITOURINARY:    dysuria    hematuria PSYCHIATRIC:   history of major depression INTEGUMENTARY:   rashes   ulcers CONSTITUTIONAL:   fever    chills  PHYSICAL EXAMINATION:  General: The patient is a well-nourished female, in no acute distress. She is sitting in a wheelchair but reports that she typically walks with a walker Vital signs are BP 128/56  Pulse 60  Resp 16  Ht  (1.6 m)  Wt 174 lb (78.926 kg)  BMI 30.83 kg/m2  SpO2 99% Pulmonary: There is a good air exchange bilaterally   Musculoskeletal: There are  no major deformities. Neurologic: No focal weakness or paresthesias are detected, Skin: Does have open ulceration on the medial aspect of her great toe amputation site where she had a prior right amputation. Does have excoriation of her pretibial area as well Psychiatric: The patient has normal affect. Cardiovascular: Palpable femoral pulses. I cannot palpate popliteal or distal pulses   [facility] Vascular Lab Studies:  Ordered and Independently Reviewed she underwent duplex imaging of her arteries. This does show patency of her common femoral artery superficial femoral artery popliteal arteries bilaterally. Tibial vessels could not be identified with extreme calcification and possible occlusions  Impression and Plan:  Had a long discussion with the patient and her son present. Quite concerned regarding loss regarding her left lower extremity. I do not feel this is adequate flow for healing of this ulceration. I have recommended arteriography for further ablation. I explained that with her tibial disease but this may be unreconstructable. Portions she does not have a severe invasive infection currently and we'll continue her wound management with Dr. Tanda Rockers at [facility]. We are planning for outpatient arteriography on 10/16/2013    EARLY, TODD [facility] [place] Office: [phone]

## 2013-10-16 ENCOUNTER — Ambulatory Visit (HOSPITAL_COMMUNITY)
Admission: RE | Admit: 2013-10-16 | Discharge: 2013-10-16 | Disposition: A | Discharge status: Home / Self Care | Payer: Medicare Other | Source: Ambulatory Visit | Attending: Vascular Surgery | Admitting: Vascular Surgery

## 2013-10-16 ENCOUNTER — Encounter (HOSPITAL_COMMUNITY)
Admission: RE | Disposition: A | Discharge status: Home / Self Care | Payer: Self-pay | Source: Ambulatory Visit | Attending: Vascular Surgery

## 2013-10-16 DIAGNOSIS — Z9861 Coronary angioplasty status: Secondary | ICD-10-CM | POA: Diagnosis not present

## 2013-10-16 DIAGNOSIS — E785 Hyperlipidemia, unspecified: Secondary | ICD-10-CM | POA: Diagnosis not present

## 2013-10-16 DIAGNOSIS — L98499 Non-pressure chronic ulcer of skin of other sites with unspecified severity: Secondary | ICD-10-CM

## 2013-10-16 DIAGNOSIS — I359 Nonrheumatic aortic valve disorder, unspecified: Secondary | ICD-10-CM | POA: Insufficient documentation

## 2013-10-16 DIAGNOSIS — I69998 Other sequelae following unspecified cerebrovascular disease: Secondary | ICD-10-CM | POA: Insufficient documentation

## 2013-10-16 DIAGNOSIS — I509 Heart failure, unspecified: Secondary | ICD-10-CM | POA: Diagnosis not present

## 2013-10-16 DIAGNOSIS — R29898 Other symptoms and signs involving the musculoskeletal system: Secondary | ICD-10-CM | POA: Insufficient documentation

## 2013-10-16 DIAGNOSIS — I2510 Atherosclerotic heart disease of native coronary artery without angina pectoris: Secondary | ICD-10-CM | POA: Diagnosis not present

## 2013-10-16 DIAGNOSIS — Z7902 Long term (current) use of antithrombotics/antiplatelets: Secondary | ICD-10-CM | POA: Diagnosis not present

## 2013-10-16 DIAGNOSIS — Z992 Dependence on renal dialysis: Secondary | ICD-10-CM | POA: Insufficient documentation

## 2013-10-16 DIAGNOSIS — I441 Atrioventricular block, second degree: Secondary | ICD-10-CM | POA: Diagnosis not present

## 2013-10-16 DIAGNOSIS — N186 End stage renal disease: Secondary | ICD-10-CM | POA: Diagnosis not present

## 2013-10-16 DIAGNOSIS — L97509 Non-pressure chronic ulcer of other part of unspecified foot with unspecified severity: Secondary | ICD-10-CM | POA: Insufficient documentation

## 2013-10-16 DIAGNOSIS — I252 Old myocardial infarction: Secondary | ICD-10-CM | POA: Diagnosis not present

## 2013-10-16 DIAGNOSIS — S98119A Complete traumatic amputation of unspecified great toe, initial encounter: Secondary | ICD-10-CM | POA: Insufficient documentation

## 2013-10-16 DIAGNOSIS — E209 Hypoparathyroidism, unspecified: Secondary | ICD-10-CM | POA: Insufficient documentation

## 2013-10-16 DIAGNOSIS — I120 Hypertensive chronic kidney disease with stage 5 chronic kidney disease or end stage renal disease: Secondary | ICD-10-CM | POA: Diagnosis not present

## 2013-10-16 DIAGNOSIS — Z950 Presence of cardiac pacemaker: Secondary | ICD-10-CM | POA: Insufficient documentation

## 2013-10-16 DIAGNOSIS — I739 Peripheral vascular disease, unspecified: Secondary | ICD-10-CM | POA: Insufficient documentation

## 2013-10-16 DIAGNOSIS — Z7982 Long term (current) use of aspirin: Secondary | ICD-10-CM | POA: Diagnosis not present

## 2013-10-16 DIAGNOSIS — E119 Type 2 diabetes mellitus without complications: Secondary | ICD-10-CM | POA: Insufficient documentation

## 2013-10-16 DIAGNOSIS — I2589 Other forms of chronic ischemic heart disease: Secondary | ICD-10-CM | POA: Diagnosis not present

## 2013-10-16 DIAGNOSIS — I4891 Unspecified atrial fibrillation: Secondary | ICD-10-CM | POA: Diagnosis not present

## 2013-10-16 DIAGNOSIS — Z794 Long term (current) use of insulin: Secondary | ICD-10-CM | POA: Diagnosis not present

## 2013-10-16 HISTORY — PX: ABDOMINAL AORTAGRAM: SHX5454

## 2013-10-16 LAB — POCT I-STAT, CHEM 8
BUN: 32 mg/dL — ABNORMAL HIGH (ref 6–23)
CALCIUM ION: 1.07 mmol/L — AB (ref 1.13–1.30)
Chloride: 98 mEq/L (ref 96–112)
Creatinine, Ser: 4.1 mg/dL — ABNORMAL HIGH (ref 0.50–1.10)
Glucose, Bld: 109 mg/dL — ABNORMAL HIGH (ref 70–99)
HEMATOCRIT: 39 % (ref 36.0–46.0)
HEMOGLOBIN: 13.3 g/dL (ref 12.0–15.0)
Potassium: 3.5 mEq/L — ABNORMAL LOW (ref 3.7–5.3)
Sodium: 139 mEq/L (ref 137–147)
TCO2: 28 mmol/L (ref 0–100)

## 2013-10-16 LAB — GLUCOSE, CAPILLARY
GLUCOSE-CAPILLARY: 95 mg/dL (ref 70–99)
Glucose-Capillary: 94 mg/dL (ref 70–99)

## 2013-10-16 SURGERY — ABDOMINAL AORTAGRAM
Anesthesia: LOCAL

## 2013-10-16 MED ORDER — SODIUM CHLORIDE 0.9 % IV SOLN: 1.0000 mL/kg/h | INTRAVENOUS | Status: DC

## 2013-10-16 MED ORDER — SODIUM CHLORIDE 0.9 % IJ SOLN: 3.0000 mL | INTRAMUSCULAR | Status: DC | PRN

## 2013-10-16 MED ORDER — HEPARIN (PORCINE) IN NACL 2-0.9 UNIT/ML-% IJ SOLN
INTRAMUSCULAR | Status: AC
  Filled 2013-10-16: qty 1000

## 2013-10-16 MED ORDER — LIDOCAINE HCL (PF) 1 % IJ SOLN
INTRAMUSCULAR | Status: AC
  Filled 2013-10-16: qty 30

## 2013-10-16 NOTE — Discharge Instructions (Signed)

## 2013-10-16 NOTE — Op Note (Signed)
    OPERATIVE REPORT  DATE OF SURGERY: 10/16/2013  PATIENT: Melissa [name], 73 y.o. female MRN: [id number]  DOB: Sep 25, 1940  PRE-OPERATIVE DIAGNOSIS: Left toe ulceration with lower extremity arterial insufficiency  POST-OPERATIVE DIAGNOSIS:  Same  PROCEDURE: Aortogram with bilateral runoff through the left groin puncture or with SonoSite ultrasound visualization  SURGEON:  Gretta Began, M.D.  PHYSICIAN ASSISTANT: Nurse  ANESTHESIA:  1% lidocaine  EBL: Minimal ml     BLOOD ADMINISTERED: None  DRAINS: None  SPECIMEN: None  COUNTS CORRECT:  YES  PLAN OF CARE: Holding area stable   PATIENT DISPOSITION:  PACU - hemodynamically stable  PROCEDURE DETAILS: The patient was placed in supine position and the peripheral vascular cath lab. Local anesthesia was used for this did anesthetize the left groin. SonoSite ultrasound visualization revealed the level of the common femoral artery. An 18-gauge needle was used to access the common femoral artery and a guidewire was passed centrally and this was confirmed with fluoroscopy. A 5 French sheath was passed over the guidewire. A pigtail catheter was positioned the level of the suprarenal aorta an AP projections were undertaken. Next the catheter was pulled down to below the level the renal arteries and long-leg runoff was obtained. This revealed widely patent aortoiliac segments with no evidence of stenosis. Runoff revealed widely patent profunda and superficial femoral arteries bilaterally. There was mild stenosis in the right popliteal artery which was not flow limiting. On the left there is a small outpouching of the below-knee popliteal artery. There was patency of the anterior tibial artery runoff on the left. This was occluded in the distal third of the calf with a great deal collaterals around the skin and flow into the foot this gave collateralization in the posterior tibial artery at the ankle. On the right there was intertibial dominant  runoff with some irregularity throughout the course but did go into the foot. The patient tolerated procedure without immediate complication was transferred to the holding room the left groin sheath was pulled  Findings 1 widely patent aortoiliac segments #2 patent common and superficial femoral profunda and popliteal arteries bilaterally #3 runoff with anterior tibial arteries bilaterally. On the left there was complete occlusion of the distal third calf intertibial artery with reconstitution with collaterals mainly into the posterior tibial to the foot  Followup will be done in the wound center at [facility] with Dr. Tanda Rockers. No role for revascularize ablation. Should have adequate artery flow for healing   Gretta Began, M.D. 10/16/2013 1:17 PM

## 2013-10-16 NOTE — Progress Notes (Signed)
Site area:left groin removed a 5 fr sheath   Site Prior to Removal:  Level 0  Pressure Applied For 60 MINUTES    Minutes Beginning at 1340  Manual:   Yes.    Patient Status During Pull:  stable  Post Pull Groin Site:  Level 1 from some mild bruising about a quarter size  Post Pull Instructions Given:  Yes.    Post Pull Pulses Present:  Yes.    Dressing Applied:  Yes.    Comments:  VS remain stable at this time.  Noted irritated area in folds of pt's groin.  Tegaderm appiled and pressure dressing over site.

## 2013-10-16 NOTE — H&P (View-Only) (Signed)
Patient name: Melissa [name] MRN: [id number] DOB: November 23, 1940 Sex: female   Referred by: Tanda Rockers  Reason for referral:  Chief Complaint  Patient presents with  . New Evaluation    Ref. by Dr. Tanda Rockers.  C/O Left Great Toe ulcer, 2 mo.  Left 2nd toe ulcer, 3 days.  [facility] - Mondays, Daughter changes dressing  Wed-Fri-Sun.  . Ischemia    Left foot/Toes    HISTORY OF PRESENT ILLNESS: Patient returns today for evaluation of left total wound. She is very pleasant 73 year old female here today with her son. She has end-stage renal disease and has dialysis on Tuesday Thursday and Saturday. She is known to our practice from prior AV access. She does have a history of prior left great amputation many years ago. She apparently had femoral-popliteal bypass prior to this at [facility] in [facility]. This toe amputation and healed for many years. She recently developed a new ulceration over this area. We are seeing her today for further evaluation. She had an outpatient noninvasive Doppler study but more at hospital labs were reviewed. She has a calcified distal vessels therefore a) are not reliable route she does have a significant history of coronary disease  Past Medical History  Diagnosis Date  . Coronary artery disease     a. cath 03/2009.(presentation wtih mild CHF & mild inc Ti): 70% prox/80% mid LAD,50% LCX.Marland KitchenLAD could be approached but with difficulty....medicall therapy recommended; b. NSTEMI 01/31/2010 w/ sepsis and anemia...EF 50%...medical Rx;  c. 08/2012 Cath: LM nl, LAD 82m ISR/5m, LCX 80p, RCA 50ost ISR, PDA/PLA 50, EF 35%; d. 08/2012 PCI of LAD with 3.0x12 Promus Premier DES.  . Diabetes mellitus   . Hyperlipidemia   . Moderate aortic stenosis     mild to moderate...echo.Marland KitchenMarland KitchenSeptember,2006/catheterization valve area 1.9cm square/mod/severe....echo...10/2009/mod/severe....echo....01/31/2010; 08/2012 Moderate by LV gram  . 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 Hypertension   . Anemia   . ESRD (end stage renal disease)     a. on dialysis.  . Carotid artery disease     doppler 2011 /  doppler 05/08/2010 similar, <50% bilateral with some plaque  . Ejection fraction     EF 50%, cardiac catheterization, January, 2012  . Arthritis   . Second degree Mobitz II AV block     St. Jude Medical Accent DR RF dual-chamber PPM (model PM [id number]; serial # [id number])  . CVA (cerebral vascular accident) 2007    right side weakness  . Ischemic cardiomyopathy     a. 06/2012 Echo: EF 25-30%.  . Myocardial infarction     Heart Attack X 4  . Atrial fibrillation   . CHF (congestive heart failure)     Past Surgical History  Procedure Laterality Date  . Appendectomy    . Oophorectomy    . Toe amputation      left great toe  . Retinal detachment surgery    . Cataract extraction    . Coronary angioplasty    . Cholecystectomy    . Avf      Left Arm  . Revision of arteriovenous goretex graft Left 03/21/2012    Procedure: REVISION OF ARTERIOVENOUS GORETEX GRAFT;  Surgeon: Chuck Hint, MD;  Location: [facility] OR;  Service: Vascular;  Laterality: Left;  Revision of left arm arteriovenous gortex graft; replace venous half of graft  . Pacemaker insertion  12/10/11    SJM Accent DR RF implanted by Dr Johney Frame for  mobitz II AV block  . Coronary angioplasty with stent placement  09/08/2012    LAD    History   Social History  . Marital Status: Divorced    Spouse Name: N/A    Number of Children: N/A  . Years of Education: N/A   Occupational History  . Not on file.   Social History Main Topics  . Smoking status: Never Smoker   . Smokeless tobacco: Never Used  . Alcohol Use: No  . Drug Use: No  . Sexual Activity: No   Other Topics Concern  . Not on file   Social History Narrative   Lives in [place] alone.    Divorced and has four children.   Has home health aide 5 days/week    Family History  Problem  Relation Age of Onset  . Coronary artery disease Mother     died age 63  . Diabetes type II Mother   . Diabetes Mother   . Hypertension Mother   . Other Father     died in 06-10-2022 of accident  . Diabetes type II Daughter   . Diabetes Daughter   . Hiatal hernia Son     Allergies as of 10/13/2013 - Review Complete 10/13/2013  Allergen Reaction Noted  . Codeine Nausea And Vomiting 06/16/2011    Current Outpatient Prescriptions on File Prior to Visit  Medication Sig Dispense Refill  . acetaminophen (TYLENOL) 325 MG tablet Take 650 mg by mouth every 6 (six) hours as needed.      Marland Kitchen amLODipine (NORVASC) 5 MG tablet TAKE 1 TABLET ONCE DAILY.  30 tablet  6  . aspirin EC 81 MG tablet Take 81 mg by mouth daily.      Marland Kitchen atorvastatin (LIPITOR) 40 MG tablet TAKE (1) TABLET BY MOUTH ONCE DAILY.  30 tablet  6  . carvedilol (COREG) 12.5 MG tablet Take 12.5 mg by mouth 2 (two) times daily with a meal.      . cinacalcet (SENSIPAR) 30 MG tablet Take 30 mg by mouth daily.       . clopidogrel (PLAVIX) 75 MG tablet Take 75 mg by mouth daily.      Marland Kitchen docusate sodium (COLACE) 100 MG capsule Take 100 mg by mouth daily.      Marland Kitchen doxazosin (CARDURA) 8 MG tablet Take 8 mg by mouth at bedtime.      . hydrALAZINE (APRESOLINE) 50 MG tablet Take 50 mg by mouth daily.       . insulin glargine (LANTUS) 100 UNIT/ML injection Inject 5-10 Units into the skin at bedtime. Per sliding scale. If blood sugar is >150 patient takes 10 Units. Patient does not take if blood sugar is 100 or below.      . insulin glulisine (APIDRA) 100 UNIT/ML injection Inject 5-15 Units into the skin 3 (three) times daily before meals. Per sliding scale      . Multiple Vitamin (DAILY VITAMIN FORMULA PO) Take 1 tablet by mouth daily. Folic acid- vitamin b complex - vitamin c - selenium - zinc 3 mg tablet.      . nitroGLYCERIN (NITROSTAT) 0.4 MG SL tablet Place 0.4 mg under the tongue every 5 (five) minutes as needed for chest pain.      Marland Kitchen nystatin cream  (MYCOSTATIN) Apply 1 application topically 2 (two) times daily as needed (itching).       Marland Kitchen olmesartan (BENICAR) 40 MG tablet Take 40 mg by mouth daily.        Marland Kitchen  omeprazole (PRILOSEC) 20 MG capsule Take 20 mg by mouth daily.      . polyethylene glycol powder (GLYCOLAX/MIRALAX) powder Take 68 g by mouth 3 (three) times a week. Tuesday, Thursday, Sunday      . sevelamer carbonate (RENVELA) 800 MG tablet Take 800 mg by mouth 3 (three) times daily with meals.       . sodium polystyrene (KAYEXALATE) 15 GM/60ML suspension Take 15 g by mouth See admin instructions. Take on Saturdays and Sundays      . amLODipine (NORVASC) 10 MG tablet Take 10 mg by mouth daily.      . atorvastatin (LIPITOR) 40 MG tablet Take 40 mg by mouth daily.       No current facility-administered medications on file prior to visit.     REVIEW OF SYSTEMS:  Positives indicated with an "X"  CARDIOVASCULAR:  [ ] chest pain   [ ] chest pressure   [ ] palpitations   [x ] orthopnea   [x ] dyspnea on exertion   [ ] claudication   [ ] rest pain   [ ] DVT   [ ] phlebitis PULMONARY:   [ ] productive cough   [ ] asthma   [ ] wheezing NEUROLOGIC:   [x ] weakness  [x ] paresthesias  [ ] aphasia  [ ] amaurosis  [ ] dizziness HEMATOLOGIC:   [ ] bleeding problems   [ ] clotting disorders MUSCULOSKELETAL:  [ ] joint pain   [ ] joint swelling GASTROINTESTINAL: [ ]  blood in stool  [ ]  hematemesis GENITOURINARY:  [ ]  dysuria  [ ]  hematuria PSYCHIATRIC:  [ ] history of major depression INTEGUMENTARY:  [ ] rashes  [ ] ulcers CONSTITUTIONAL:  [ ] fever   [ ] chills  PHYSICAL EXAMINATION:  General: The patient is a well-nourished female, in no acute distress. She is sitting in a wheelchair but reports that she typically walks with a walker Vital signs are BP 128/56  Pulse 60  Resp 16  Ht 5' 3" (1.6 m)  Wt 174 lb (78.926 kg)  BMI 30.83 kg/m2  SpO2 99% Pulmonary: There is a good air exchange bilaterally   Musculoskeletal: There are  no major deformities. Neurologic: No focal weakness or paresthesias are detected, Skin: Does have open ulceration on the medial aspect of her great toe amputation site where she had a prior right amputation. Does have excoriation of her pretibial area as well Psychiatric: The patient has normal affect. Cardiovascular: Palpable femoral pulses. I cannot palpate popliteal or distal pulses   VVS Vascular Lab Studies:  Ordered and Independently Reviewed she underwent duplex imaging of her arteries. This does show patency of her common femoral artery superficial femoral artery popliteal arteries bilaterally. Tibial vessels could not be identified with extreme calcification and possible occlusions  Impression and Plan:  Had a long discussion with the patient and her son present. Quite concerned regarding loss regarding her left lower extremity. I do not feel this is adequate flow for healing of this ulceration. I have recommended arteriography for further ablation. I explained that with her tibial disease but this may be unreconstructable. Portions she does not have a severe invasive infection currently and we'll continue her wound management with Dr. Nichols at [facility]. We are planning for outpatient arteriography on 10/16/2013    Nyrah Demos [facility] [place] Office: [phone]         

## 2013-10-16 NOTE — Interval H&P Note (Signed)
History and Physical Interval Note:  10/16/2013 12:27 PM  Melissa [name]  has presented today for surgery, with the diagnosis of left foot wound  The various methods of treatment have been discussed with the patient and family. After consideration of risks, benefits and other options for treatment, the patient has consented to  Procedure(s): ABDOMINAL AORTAGRAM (N/A) as a surgical intervention .  The patient's history has been reviewed, patient examined, no change in status, stable for surgery.  I have reviewed the patient's chart and labs.  Questions were answered to the patient's satisfaction.     Huie Ghuman

## 2013-10-23 ENCOUNTER — Ambulatory Visit (INDEPENDENT_AMBULATORY_CARE_PROVIDER_SITE_OTHER): Payer: Medicare Other | Admitting: Ophthalmology

## 2013-10-23 DIAGNOSIS — E1165 Type 2 diabetes mellitus with hyperglycemia: Secondary | ICD-10-CM

## 2013-10-23 DIAGNOSIS — E1139 Type 2 diabetes mellitus with other diabetic ophthalmic complication: Secondary | ICD-10-CM

## 2013-10-23 DIAGNOSIS — H35379 Puckering of macula, unspecified eye: Secondary | ICD-10-CM

## 2013-10-23 DIAGNOSIS — H43819 Vitreous degeneration, unspecified eye: Secondary | ICD-10-CM

## 2013-10-23 DIAGNOSIS — E11319 Type 2 diabetes mellitus with unspecified diabetic retinopathy without macular edema: Secondary | ICD-10-CM

## 2013-10-23 DIAGNOSIS — H35039 Hypertensive retinopathy, unspecified eye: Secondary | ICD-10-CM

## 2013-10-23 DIAGNOSIS — E11359 Type 2 diabetes mellitus with proliferative diabetic retinopathy without macular edema: Secondary | ICD-10-CM

## 2013-10-23 DIAGNOSIS — I10 Essential (primary) hypertension: Secondary | ICD-10-CM

## 2013-10-26 ENCOUNTER — Encounter: Payer: Self-pay | Admitting: Cardiology

## 2013-10-26 ENCOUNTER — Ambulatory Visit (INDEPENDENT_AMBULATORY_CARE_PROVIDER_SITE_OTHER): Payer: Medicare Other | Admitting: Cardiology

## 2013-10-26 VITALS — BP 98/61 | HR 70 | Ht 63.0 in | Wt 174.8 lb

## 2013-10-26 DIAGNOSIS — I739 Peripheral vascular disease, unspecified: Secondary | ICD-10-CM

## 2013-10-26 DIAGNOSIS — I429 Cardiomyopathy, unspecified: Secondary | ICD-10-CM

## 2013-10-26 DIAGNOSIS — I10 Essential (primary) hypertension: Secondary | ICD-10-CM

## 2013-10-26 DIAGNOSIS — I350 Nonrheumatic aortic (valve) stenosis: Secondary | ICD-10-CM

## 2013-10-26 DIAGNOSIS — Z950 Presence of cardiac pacemaker: Secondary | ICD-10-CM

## 2013-10-26 DIAGNOSIS — L98499 Non-pressure chronic ulcer of skin of other sites with unspecified severity: Secondary | ICD-10-CM

## 2013-10-26 DIAGNOSIS — I359 Nonrheumatic aortic valve disorder, unspecified: Secondary | ICD-10-CM

## 2013-10-26 DIAGNOSIS — I428 Other cardiomyopathies: Secondary | ICD-10-CM

## 2013-10-26 DIAGNOSIS — I2510 Atherosclerotic heart disease of native coronary artery without angina pectoris: Secondary | ICD-10-CM

## 2013-10-26 DIAGNOSIS — N185 Chronic kidney disease, stage 5: Secondary | ICD-10-CM

## 2013-10-26 NOTE — Patient Instructions (Signed)
Continue all current medications. Your physician wants you to follow up in: 6 months.  You will receive a reminder letter in the mail one-two months in advance.  If you don't receive a letter, please call our office to schedule the follow up appointment   

## 2013-10-26 NOTE — Assessment & Plan Note (Signed)
Her pacemaker is followed by the electrophysiology team. 

## 2013-10-26 NOTE — Progress Notes (Signed)
Patient ID: Melissa [name], female   DOB: 1940-07-27, 73 y.o.   MRN: [id number]    HPI  This delightful lady is seen for followup of her multiple cardiac issues. Fortunately she is stable despite so many problems. Recently she had angiography of her legs by Dr. Arbie Cookey. He had good news that she could continue medical therapy. She's getting excellent help with the wound care center. She's not having any significant chest pain or shortness of breath. She had a PCI to the LAD in August, 2014. She continues on aspirin and Plavix.  Allergies  Allergen Reactions  . Codeine Nausea And Vomiting    Current Outpatient Prescriptions  Medication Sig Dispense Refill  . acetaminophen (TYLENOL) 500 MG tablet Take 1,000 mg by mouth 2 (two) times daily as needed (pain).      Marland Kitchen amLODipine (NORVASC) 5 MG tablet Take 5 mg by mouth daily.      Marland Kitchen aspirin EC 81 MG tablet Take 81 mg by mouth daily.      Marland Kitchen atorvastatin (LIPITOR) 40 MG tablet Take 40 mg by mouth daily.      . carvedilol (COREG) 12.5 MG tablet Take 12.5 mg by mouth 2 (two) times daily with a meal.      . cinacalcet (SENSIPAR) 30 MG tablet Take 30 mg by mouth at bedtime.       . clopidogrel (PLAVIX) 75 MG tablet Take 75 mg by mouth daily.      Marland Kitchen docusate sodium (COLACE) 100 MG capsule Take 100 mg by mouth daily.      . folic acid-vitamin b complex-vitamin c-selenium-zinc (DIALYVITE) 3 MG TABS tablet Take 1 tablet by mouth daily.      . hydrALAZINE (APRESOLINE) 50 MG tablet Take 50 mg by mouth 2 (two) times daily.      . insulin glargine (LANTUS) 100 UNIT/ML injection Inject 10 Units into the skin at bedtime.       . insulin glulisine (APIDRA) 100 UNIT/ML injection Inject 6-10 Units into the skin 3 (three) times daily before meals. Per sliding scale      . midodrine (PROAMATINE) 5 MG tablet Take 10 mg by mouth 3 (three) times a week. *takes on Tuesday, Thursday, and Saturday prior to dialysis*      . nitroGLYCERIN (NITROSTAT) 0.4 MG SL tablet Place 0.4 mg  under the tongue every 5 (five) minutes as needed for chest pain.      Marland Kitchen nystatin cream (MYCOSTATIN) Apply 1 application topically 3 (three) times daily as needed (for rash).       Marland Kitchen olmesartan (BENICAR) 40 MG tablet Take 40 mg by mouth daily.      Marland Kitchen omeprazole (PRILOSEC) 20 MG capsule Take 20 mg by mouth daily.      . polyethylene glycol powder (GLYCOLAX/MIRALAX) powder Take 51 g by mouth 3 (three) times a week. Tuesday, Thursday, Sunday      . sevelamer carbonate (RENVELA) 800 MG tablet Take 800 mg by mouth 3 (three) times daily with meals.       . sodium polystyrene (KAYEXALATE) 15 GM/60ML suspension Take 30 g by mouth 2 (two) times a week. Take on Saturdays and Sundays       No current facility-administered medications for this visit.    History   Social History  . Marital Status: Divorced    Spouse Name: N/A    Number of Children: N/A  . Years of Education: N/A   Occupational History  . Not on file.  Social History Main Topics  . Smoking status: Never Smoker   . Smokeless tobacco: Never Used  . Alcohol Use: No  . Drug Use: No  . Sexual Activity: No   Other Topics Concern  . Not on file   Social History Narrative   Lives in [place] alone.    Divorced and has four children.   Has home health aide 5 days/week    Family History  Problem Relation Age of Onset  . Coronary artery disease Mother     died age 63  . Diabetes type II Mother   . Diabetes Mother   . Hypertension Mother   . Other Father     died in 2022/06/17 of accident  . Diabetes type II Daughter   . Diabetes Daughter   . Hiatal hernia Son     Past Medical History  Diagnosis Date  . Coronary artery disease     a. cath 03/2009.(presentation wtih mild CHF & mild inc Ti): 70% prox/80% mid LAD,50% LCX.Marland KitchenLAD could be approached but with difficulty....medicall therapy recommended; b. NSTEMI 01/31/2010 w/ sepsis and anemia...EF 50%...medical Rx;  c. 08/2012 Cath: LM nl, LAD 40m ISR/52m, LCX 80p, RCA 50ost ISR, PDA/PLA  50, EF 35%; d. 08/2012 PCI of LAD with 3.0x12 Promus Premier DES.  . Diabetes mellitus   . Hyperlipidemia   . Moderate aortic stenosis     mild to moderate...echo.Marland KitchenMarland KitchenSeptember,2006/catheterization valve area 1.9cm square/mod/severe....echo...10/2009/mod/severe....echo....01/31/2010; 08/2012 Moderate by LV gram  . 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 Hypertension   . Anemia   . ESRD (end stage renal disease)     a. on dialysis.  . Carotid artery disease     doppler 2009/06/16 /  doppler 05/08/2010 similar, <50% bilateral with some plaque  . Ejection fraction     EF 50%, cardiac catheterization, January, 2012  . Arthritis   . Second degree Mobitz II AV block     [employer] Accent DR RF dual-chamber PPM (model PM [id number]; serial # [id number])  . CVA (cerebral vascular accident) 06/16/2005    right side weakness  . Ischemic cardiomyopathy     a. 06/2012 Echo: EF 25-30%.  . Myocardial infarction     Heart Attack X 4  . Atrial fibrillation   . CHF (congestive heart failure)     Past Surgical History  Procedure Laterality Date  . Appendectomy    . Oophorectomy    . Toe amputation      left great toe  . Retinal detachment surgery    . Cataract extraction    . Coronary angioplasty    . Cholecystectomy    . Avf      Left Arm  . Revision of arteriovenous goretex graft Left 03/21/2012    Procedure: REVISION OF ARTERIOVENOUS GORETEX GRAFT;  Surgeon: Chuck Hint, MD;  Location: [facility] OR;  Service: Vascular;  Laterality: Left;  Revision of left arm arteriovenous gortex graft; replace venous half of graft  . Pacemaker insertion  12/10/11    SJM Accent DR RF implanted by Dr Johney Frame for mobitz II AV block  . Coronary angioplasty with stent placement  09/08/2012    LAD    Patient Active Problem List   Diagnosis Date Noted  . Ischemia of foot-Left 10/13/2013  . Atherosclerosis of native arteries of the extremities with ulceration(440.23)-Left Great toe  and 2nd Toe 10/13/2013  . Second degree Mobitz II AV block 10/31/2012  . Chronic systolic CHF (congestive  heart failure) 10/17/2012  . End stage renal disease 09/09/2012  . Unstable angina 09/09/2012  . Ischemic cardiomyopathy 09/09/2012  . Dialysis patient   . Cardiomyopathy   . Pacemaker-St.Jude 12/11/2011  . Thrombocytopenia 06/19/2011  . Ejection fraction   . Coronary artery disease   . Hyperlipidemia   . Aortic stenosis   . Dysfunction parathyroid   . Hypertension   . CVA (cerebral vascular accident)   . Chronic kidney disease   . Carotid artery disease   . Edema   . 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 Anemia in chronic kidney disease(285.21) 04/22/2009  . PERSONAL HX TIA & CI W/O RESIDUAL DEFICITS 04/22/2009    ROS   Patient denies fever, chills, headache, sweats, rash, change in vision, change in hearing, chest pain, cough, nausea vomiting, urinary symptoms. All other systems are reviewed and are negative.  PHYSICAL EXAM  Patient is wearing a boot on her right leg. She is overweight. She is oriented to person time and place. Affect is normal. Head is atraumatic. Sclera and conjunctiva are normal. Lungs reveal decreased breath sounds. There is no respiratory distress. Cardiac exam reveals S1 and S2. There is a high pitched murmur of aortic stenosis. The second heart sound is heard. Abdomen is soft.  Filed Vitals:   10/26/13 1522  BP: 98/61  Pulse: 70  Height: 5\' 3"  (1.6 m)  Weight: 174 lb 12 oz (79.266 kg)  SpO2: 96%   EKG is done today and reviewed by me. The rhythm is paced.  ASSESSMENT & PLAN

## 2013-10-26 NOTE — Assessment & Plan Note (Signed)
Patient received a drug-eluting stent to the LAD in August, 2014. Her circumflex disease was not approached. She is not having significant symptoms. He'll continue medical therapy at this time.

## 2013-10-26 NOTE — Assessment & Plan Note (Signed)
She continues on dialysis.

## 2013-10-26 NOTE — Assessment & Plan Note (Signed)
When I see her in 6 months we will consider followup echo.

## 2013-10-26 NOTE — Assessment & Plan Note (Addendum)
The patient has severe aortic stenosis. At catheterization in August, 2014, it was felt that her aortic valve was not critical. It was felt that she should be treated with PCI to the LAD at that time. The patient is not having any significant symptoms at this time. I have chosen not to repeat an echo at this time. When I see her back in 6 months, we will consider echo to reassess LV function and her aortic valve.

## 2013-11-05 ENCOUNTER — Other Ambulatory Visit: Payer: Self-pay | Admitting: Cardiology

## 2013-11-17 ENCOUNTER — Other Ambulatory Visit (HOSPITAL_COMMUNITY): Payer: Self-pay | Admitting: Family Medicine

## 2013-11-17 DIAGNOSIS — M869 Osteomyelitis, unspecified: Secondary | ICD-10-CM

## 2013-11-18 ENCOUNTER — Inpatient Hospital Stay (HOSPITAL_COMMUNITY): Admission: RE | Admit: 2013-11-18 | Payer: Medicare Other | Source: Ambulatory Visit

## 2013-11-18 ENCOUNTER — Other Ambulatory Visit (HOSPITAL_COMMUNITY): Payer: Self-pay | Admitting: Family Medicine

## 2013-11-18 ENCOUNTER — Ambulatory Visit (HOSPITAL_COMMUNITY)
Admission: RE | Admit: 2013-11-18 | Discharge: 2013-11-18 | Disposition: A | Discharge status: Home / Self Care | Payer: Medicare Other | Source: Ambulatory Visit | Attending: Family Medicine | Admitting: Family Medicine

## 2013-11-18 DIAGNOSIS — M869 Osteomyelitis, unspecified: Secondary | ICD-10-CM

## 2013-11-18 DIAGNOSIS — Z452 Encounter for adjustment and management of vascular access device: Secondary | ICD-10-CM | POA: Diagnosis not present

## 2013-11-18 DIAGNOSIS — N186 End stage renal disease: Secondary | ICD-10-CM | POA: Insufficient documentation

## 2013-11-18 MED ORDER — LIDOCAINE HCL 1 % IJ SOLN
INTRAMUSCULAR | Status: AC
  Filled 2013-11-18: qty 20

## 2013-11-20 ENCOUNTER — Encounter: Payer: Medicare Other | Admitting: Internal Medicine

## 2013-11-22 NOTE — Progress Notes (Signed)
No show

## 2014-01-07 ENCOUNTER — Encounter (HOSPITAL_COMMUNITY): Payer: Self-pay | Admitting: Cardiovascular Disease

## 2014-01-08 ENCOUNTER — Encounter: Payer: Medicare Other | Admitting: Internal Medicine

## 2014-01-20 ENCOUNTER — Encounter: Payer: Self-pay | Admitting: *Deleted

## 2014-01-29 DEATH — deceased

## 2014-11-01 ENCOUNTER — Ambulatory Visit (INDEPENDENT_AMBULATORY_CARE_PROVIDER_SITE_OTHER): Payer: Medicare Other | Admitting: Ophthalmology

## 2014-12-03 ENCOUNTER — Encounter: Payer: Self-pay | Admitting: *Deleted

## 2015-05-26 IMAGING — XA IR FLUORO GUIDE CV LINE*R*
1 series · 2 of 2 positions shown · non-contrast
Comparison: none

CLINICAL DATA: 73-year-old female with osteomyelitis in need of
durable central venous access for intravenous antibiotic therapy.
Patient currently resides at [facility]. Also history
of end-stage renal disease with left upper extremity arterial venous
access and right subclavian pacemaker. An attempt was made to place
a tunneled right IJ PICC.

EXAM:
IR RIGHT FLOURO GUIDE CV LINE; IR ULTRASOUND GUIDANCE VASC ACCESS
RIGHT
FLUOROSCOPY TIME:  30 seconds
TECHNIQUE: The right neck and chest were prepped with chlorhexidine, draped in
the usual sterile fashion using maximum barrier technique (cap and
mask, sterile gown, sterile gloves, large sterile sheet, hand
hygiene and cutaneous antiseptic). Local anesthesia was attained by
infiltration with 1% lidocaine.

[Series 1: run · 2 of 2 slices shown]
[im 1/2]
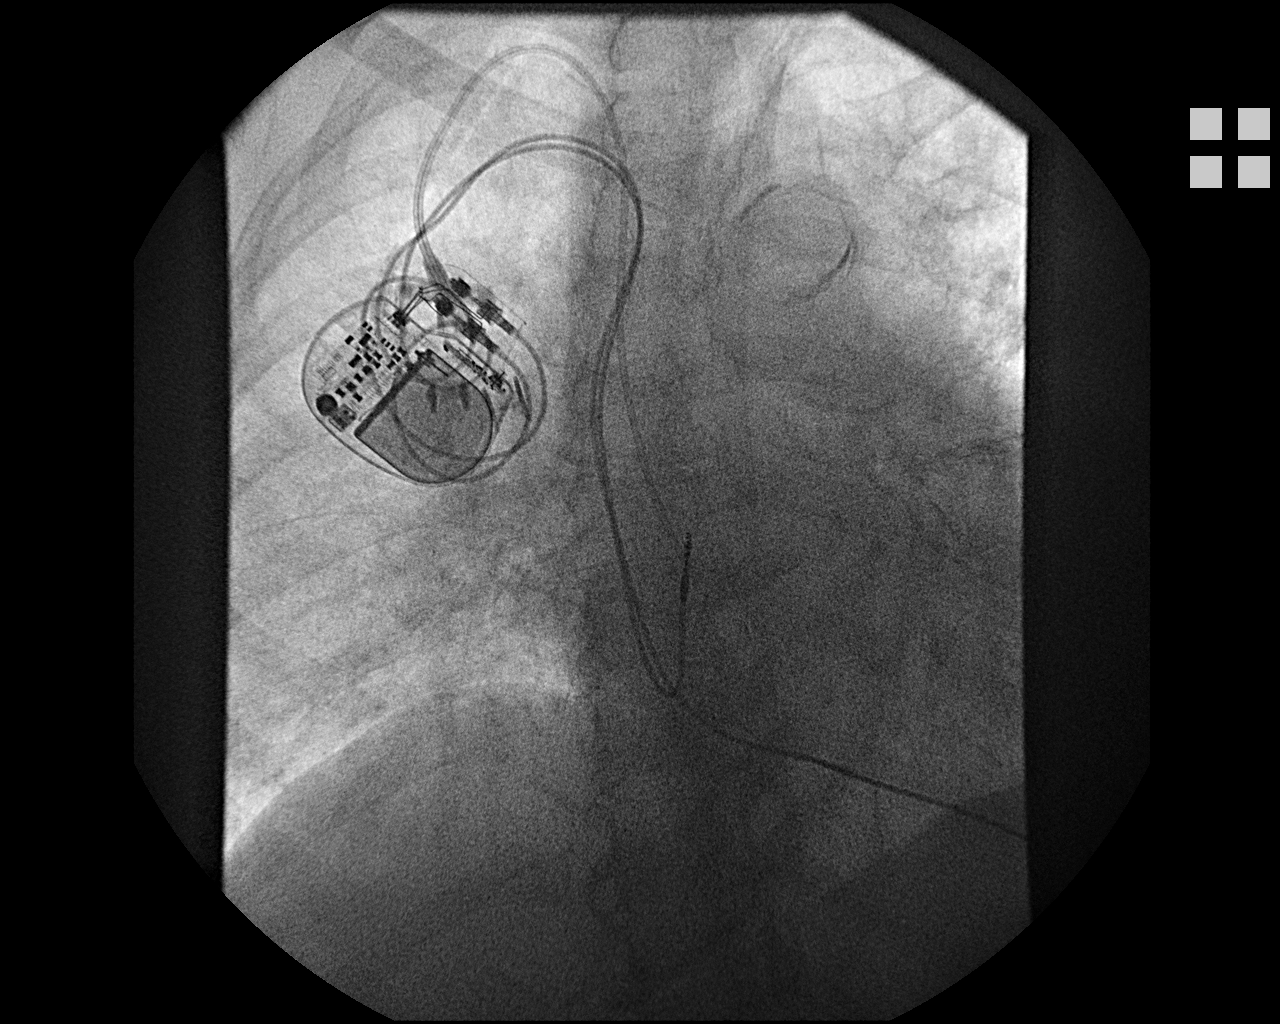
[im 2/2]
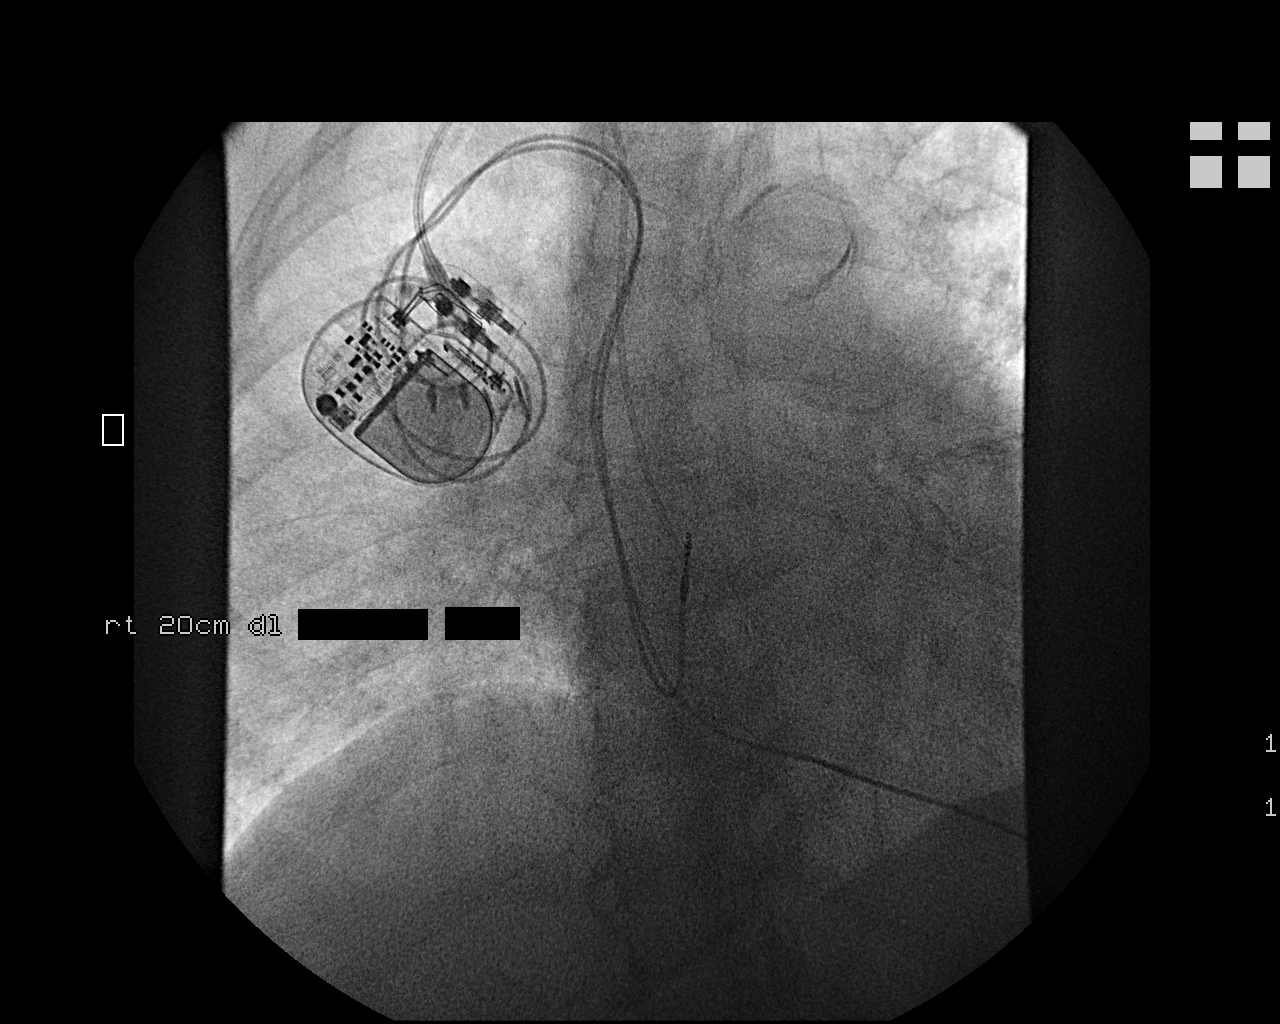

[2 of 2 positions shown; findings below may reference images not displayed]

Ultrasound demonstrated patency of the right internal jugular vein,
and this was documented with an image. A small dermatotomy was made.
Under real-time ultrasound guidance, this vein was accessed with a
21 gauge micropuncture needle and image documentation was performed.
The needle was exchanged over a guidewire for a peel-away sheath. A
suitable skin entry site inferior to the clavicle was then selected.
Local anesthesia was again attained by infiltration with 1%
lidocaine. A small dermatotomy was made. A 5 French dual-lumen
tunneled power PICC was then tunneled from the skin exit site to the
dermatotomy overlying the venous access site. The catheter was cut
to length (20 cm) and then advanced through the peel-away sheath and
positioned with its tip at the lower SVC/right atrial junction.

Fluoroscopy during the procedure and fluoro spot radiograph confirms
appropriate catheter position. The catheter was flushed, secured to
the skin with Prolene sutures, and covered with a sterile dressing.

COMPLICATIONS:
None.  The patient tolerated the procedure well.
IMPRESSION: Successful placement of a tunneled right IJ power PICC with
sonographic and fluoroscopic guidance. The catheter is ready for
use.

## 2016-04-23 ENCOUNTER — Telehealth: Payer: Self-pay | Admitting: *Deleted

## 2016-04-23 NOTE — Telephone Encounter (Signed)
[id number]  Patient marked inactive in [facility]. Need to know if patient is followed elsewhere.

## 2016-04-27 NOTE — Telephone Encounter (Signed)
Spoke to son regarding patient's whereabouts. Son said that patient died on January 28, 2014.   Will send information to MR so our records can be updated.
